# Patient Record
Sex: Female | Born: 1970
Health system: Southern US, Community
[De-identification: ages and names within clinical notes are randomized; demographics above are authoritative.]

## PROBLEM LIST (undated history)

## (undated) ENCOUNTER — Emergency Department (HOSPITAL_BASED_OUTPATIENT_CLINIC_OR_DEPARTMENT_OTHER): Admission: EM | Payer: BLUE CROSS/BLUE SHIELD | Source: Home / Self Care

## (undated) DIAGNOSIS — O1420 HELLP syndrome (HELLP), unspecified trimester: Secondary | ICD-10-CM

## (undated) DIAGNOSIS — A483 Toxic shock syndrome: Secondary | ICD-10-CM

## (undated) DIAGNOSIS — O139 Gestational [pregnancy-induced] hypertension without significant proteinuria, unspecified trimester: Secondary | ICD-10-CM

## (undated) DIAGNOSIS — I253 Aneurysm of heart: Secondary | ICD-10-CM

## (undated) DIAGNOSIS — F32A Depression, unspecified: Secondary | ICD-10-CM

## (undated) DIAGNOSIS — F53 Postpartum depression: Secondary | ICD-10-CM

## (undated) DIAGNOSIS — L0291 Cutaneous abscess, unspecified: Secondary | ICD-10-CM

## (undated) DIAGNOSIS — O159 Eclampsia, unspecified as to time period: Secondary | ICD-10-CM

## (undated) DIAGNOSIS — R011 Cardiac murmur, unspecified: Secondary | ICD-10-CM

## (undated) DIAGNOSIS — N719 Inflammatory disease of uterus, unspecified: Secondary | ICD-10-CM

## (undated) DIAGNOSIS — F329 Major depressive disorder, single episode, unspecified: Secondary | ICD-10-CM

## (undated) DIAGNOSIS — N63 Unspecified lump in unspecified breast: Secondary | ICD-10-CM

## (undated) DIAGNOSIS — B009 Herpesviral infection, unspecified: Secondary | ICD-10-CM

## (undated) DIAGNOSIS — M25369 Other instability, unspecified knee: Secondary | ICD-10-CM

## (undated) DIAGNOSIS — Z9289 Personal history of other medical treatment: Secondary | ICD-10-CM

## (undated) DIAGNOSIS — K589 Irritable bowel syndrome without diarrhea: Secondary | ICD-10-CM

## (undated) DIAGNOSIS — O99345 Other mental disorders complicating the puerperium: Secondary | ICD-10-CM

## (undated) HISTORY — DX: Postpartum depression: F53.0

## (undated) HISTORY — DX: Cardiac murmur, unspecified: R01.1

## (undated) HISTORY — PX: DILATION AND CURETTAGE OF UTERUS: SHX78

## (undated) HISTORY — DX: Gestational (pregnancy-induced) hypertension without significant proteinuria, unspecified trimester: O13.9

## (undated) HISTORY — DX: Aneurysm of heart: I25.3

## (undated) HISTORY — DX: Other mental disorders complicating the puerperium: O99.345

## (undated) HISTORY — DX: Personal history of other medical treatment: Z92.89

---

## 1994-11-02 HISTORY — PX: INCISE AND DRAIN ABCESS: PRO64

## 1995-11-03 DIAGNOSIS — A483 Toxic shock syndrome: Secondary | ICD-10-CM

## 1995-11-03 HISTORY — DX: Toxic shock syndrome: A48.3

## 1998-02-25 ENCOUNTER — Other Ambulatory Visit: Admission: RE | Admit: 1998-02-25 | Discharge: 1998-02-25 | Payer: Self-pay | Admitting: Obstetrics and Gynecology

## 1998-07-19 ENCOUNTER — Ambulatory Visit (HOSPITAL_COMMUNITY): Admission: RE | Admit: 1998-07-19 | Discharge: 1998-07-19 | Payer: Self-pay | Admitting: Obstetrics and Gynecology

## 1998-11-19 ENCOUNTER — Other Ambulatory Visit: Admission: RE | Admit: 1998-11-19 | Discharge: 1998-11-19 | Payer: Self-pay | Admitting: Obstetrics and Gynecology

## 1999-01-15 ENCOUNTER — Ambulatory Visit (HOSPITAL_COMMUNITY): Admission: RE | Admit: 1999-01-15 | Discharge: 1999-01-15 | Payer: Self-pay | Admitting: Obstetrics and Gynecology

## 1999-01-15 ENCOUNTER — Encounter: Payer: Self-pay | Admitting: Obstetrics and Gynecology

## 1999-06-08 ENCOUNTER — Inpatient Hospital Stay (HOSPITAL_COMMUNITY): Admission: AD | Admit: 1999-06-08 | Discharge: 1999-06-08 | Payer: Self-pay | Admitting: Obstetrics and Gynecology

## 1999-06-15 ENCOUNTER — Inpatient Hospital Stay (HOSPITAL_COMMUNITY): Admission: AD | Admit: 1999-06-15 | Discharge: 1999-06-17 | Payer: Self-pay | Admitting: *Deleted

## 1999-06-15 ENCOUNTER — Encounter (INDEPENDENT_AMBULATORY_CARE_PROVIDER_SITE_OTHER): Payer: Self-pay | Admitting: Specialist

## 1999-12-23 ENCOUNTER — Other Ambulatory Visit: Admission: RE | Admit: 1999-12-23 | Discharge: 1999-12-23 | Payer: Self-pay | Admitting: Obstetrics and Gynecology

## 2000-08-04 ENCOUNTER — Other Ambulatory Visit: Admission: RE | Admit: 2000-08-04 | Discharge: 2000-08-04 | Payer: Self-pay | Admitting: Obstetrics and Gynecology

## 2000-09-15 ENCOUNTER — Ambulatory Visit (HOSPITAL_COMMUNITY): Admission: RE | Admit: 2000-09-15 | Discharge: 2000-09-15 | Payer: Self-pay | Admitting: Obstetrics and Gynecology

## 2000-09-15 ENCOUNTER — Encounter: Payer: Self-pay | Admitting: Obstetrics and Gynecology

## 2001-02-25 ENCOUNTER — Inpatient Hospital Stay (HOSPITAL_COMMUNITY): Admission: AD | Admit: 2001-02-25 | Discharge: 2001-02-27 | Payer: Self-pay | Admitting: Obstetrics and Gynecology

## 2001-08-18 ENCOUNTER — Other Ambulatory Visit: Admission: RE | Admit: 2001-08-18 | Discharge: 2001-08-18 | Payer: Self-pay | Admitting: Obstetrics and Gynecology

## 2002-08-23 ENCOUNTER — Other Ambulatory Visit: Admission: RE | Admit: 2002-08-23 | Discharge: 2002-08-23 | Payer: Self-pay | Admitting: Obstetrics and Gynecology

## 2003-03-06 ENCOUNTER — Ambulatory Visit (HOSPITAL_COMMUNITY): Admission: RE | Admit: 2003-03-06 | Discharge: 2003-03-06 | Payer: Self-pay | Admitting: Obstetrics and Gynecology

## 2003-03-06 ENCOUNTER — Encounter: Payer: Self-pay | Admitting: Obstetrics and Gynecology

## 2003-04-30 ENCOUNTER — Encounter: Payer: Self-pay | Admitting: Obstetrics and Gynecology

## 2003-04-30 ENCOUNTER — Ambulatory Visit (HOSPITAL_COMMUNITY): Admission: RE | Admit: 2003-04-30 | Discharge: 2003-04-30 | Payer: Self-pay | Admitting: Obstetrics and Gynecology

## 2003-05-21 ENCOUNTER — Ambulatory Visit (HOSPITAL_COMMUNITY): Admission: RE | Admit: 2003-05-21 | Discharge: 2003-05-21 | Payer: Self-pay | Admitting: Obstetrics and Gynecology

## 2003-05-21 ENCOUNTER — Encounter: Payer: Self-pay | Admitting: Obstetrics and Gynecology

## 2003-07-06 ENCOUNTER — Ambulatory Visit (HOSPITAL_COMMUNITY): Admission: RE | Admit: 2003-07-06 | Discharge: 2003-07-06 | Payer: Self-pay | Admitting: Obstetrics and Gynecology

## 2003-07-06 ENCOUNTER — Encounter: Payer: Self-pay | Admitting: Obstetrics and Gynecology

## 2003-08-01 ENCOUNTER — Inpatient Hospital Stay (HOSPITAL_COMMUNITY): Admission: AD | Admit: 2003-08-01 | Discharge: 2003-08-03 | Payer: Self-pay | Admitting: Obstetrics and Gynecology

## 2003-08-05 ENCOUNTER — Inpatient Hospital Stay (HOSPITAL_COMMUNITY): Admission: AD | Admit: 2003-08-05 | Discharge: 2003-08-09 | Payer: Self-pay | Admitting: Obstetrics and Gynecology

## 2003-08-06 ENCOUNTER — Encounter: Payer: Self-pay | Admitting: Obstetrics and Gynecology

## 2003-08-07 ENCOUNTER — Encounter: Payer: Self-pay | Admitting: Obstetrics and Gynecology

## 2003-08-07 ENCOUNTER — Encounter (INDEPENDENT_AMBULATORY_CARE_PROVIDER_SITE_OTHER): Payer: Self-pay

## 2003-09-11 ENCOUNTER — Other Ambulatory Visit: Admission: RE | Admit: 2003-09-11 | Discharge: 2003-09-11 | Payer: Self-pay | Admitting: Obstetrics and Gynecology

## 2004-11-07 ENCOUNTER — Other Ambulatory Visit: Admission: RE | Admit: 2004-11-07 | Discharge: 2004-11-07 | Payer: Self-pay | Admitting: Obstetrics and Gynecology

## 2005-10-16 ENCOUNTER — Ambulatory Visit (HOSPITAL_COMMUNITY): Admission: RE | Admit: 2005-10-16 | Discharge: 2005-10-16 | Payer: Self-pay | Admitting: Obstetrics and Gynecology

## 2005-10-16 ENCOUNTER — Encounter (INDEPENDENT_AMBULATORY_CARE_PROVIDER_SITE_OTHER): Payer: Self-pay | Admitting: Specialist

## 2005-11-28 ENCOUNTER — Other Ambulatory Visit: Admission: RE | Admit: 2005-11-28 | Discharge: 2005-11-28 | Payer: Self-pay | Admitting: Obstetrics and Gynecology

## 2006-04-09 ENCOUNTER — Inpatient Hospital Stay (HOSPITAL_COMMUNITY): Admission: AD | Admit: 2006-04-09 | Discharge: 2006-04-09 | Payer: Self-pay | Admitting: Obstetrics and Gynecology

## 2006-11-10 ENCOUNTER — Inpatient Hospital Stay (HOSPITAL_COMMUNITY): Admission: AD | Admit: 2006-11-10 | Discharge: 2006-11-12 | Payer: Self-pay | Admitting: Obstetrics and Gynecology

## 2006-11-10 ENCOUNTER — Encounter (INDEPENDENT_AMBULATORY_CARE_PROVIDER_SITE_OTHER): Payer: Self-pay | Admitting: Specialist

## 2006-11-19 ENCOUNTER — Inpatient Hospital Stay (HOSPITAL_COMMUNITY): Admission: AD | Admit: 2006-11-19 | Discharge: 2006-11-19 | Payer: Self-pay | Admitting: Obstetrics and Gynecology

## 2008-12-08 ENCOUNTER — Emergency Department (HOSPITAL_BASED_OUTPATIENT_CLINIC_OR_DEPARTMENT_OTHER): Admission: EM | Admit: 2008-12-08 | Discharge: 2008-12-08 | Payer: Self-pay | Admitting: Emergency Medicine

## 2008-12-08 ENCOUNTER — Ambulatory Visit: Payer: Self-pay | Admitting: Diagnostic Radiology

## 2009-01-17 ENCOUNTER — Emergency Department (HOSPITAL_BASED_OUTPATIENT_CLINIC_OR_DEPARTMENT_OTHER): Admission: EM | Admit: 2009-01-17 | Discharge: 2009-01-17 | Payer: Self-pay | Admitting: Emergency Medicine

## 2011-02-12 LAB — URINALYSIS, ROUTINE W REFLEX MICROSCOPIC
Glucose, UA: NEGATIVE mg/dL
Ketones, ur: NEGATIVE mg/dL
Urobilinogen, UA: 0.2 mg/dL (ref 0.0–1.0)

## 2011-02-17 LAB — URINALYSIS, ROUTINE W REFLEX MICROSCOPIC
Bilirubin Urine: NEGATIVE
Glucose, UA: NEGATIVE mg/dL
Hgb urine dipstick: NEGATIVE
Ketones, ur: NEGATIVE mg/dL
Protein, ur: NEGATIVE mg/dL
pH: 6 (ref 5.0–8.0)

## 2011-02-17 LAB — CBC
MCHC: 31.8 g/dL (ref 30.0–36.0)
RBC: 5.42 MIL/uL — ABNORMAL HIGH (ref 3.87–5.11)
WBC: 8.6 10*3/uL (ref 4.0–10.5)

## 2011-02-17 LAB — DIFFERENTIAL
Basophils Relative: 4 % — ABNORMAL HIGH (ref 0–1)
Eosinophils Absolute: 0 10*3/uL (ref 0.0–0.7)
Eosinophils Relative: 1 % (ref 0–5)
Lymphocytes Relative: 19 % (ref 12–46)
Lymphs Abs: 1.7 10*3/uL (ref 0.7–4.0)
Monocytes Absolute: 0.9 10*3/uL (ref 0.1–1.0)
Monocytes Relative: 11 % (ref 3–12)
Neutro Abs: 5.7 10*3/uL (ref 1.7–7.7)
Neutrophils Relative %: 66 % (ref 43–77)

## 2011-02-17 LAB — URINE CULTURE

## 2011-02-17 LAB — COMPREHENSIVE METABOLIC PANEL
ALT: 26 U/L (ref 0–35)
Albumin: 3.6 g/dL (ref 3.5–5.2)
Alkaline Phosphatase: 66 U/L (ref 39–117)
BUN: 11 mg/dL (ref 6–23)
Creatinine, Ser: 0.7 mg/dL (ref 0.4–1.2)
Sodium: 139 mEq/L (ref 135–145)

## 2011-03-20 NOTE — Discharge Summary (Signed)
NAME:  Tiffany Sharp, Tiffany Sharp                         ACCOUNT NO.:  [id number]   MEDICAL RECORD NO.:  [id number]                   PATIENT TYPE:  INP   LOCATION:  9326                                 [facility]ILITY:  [facility]   PHYSICIAN:  Naima A. Dillard, M.D.              DATE OF BIRTH:  October 23, 1971   DATE OF ADMISSION:  08/05/2003  DATE OF DISCHARGE:  08/09/2003                                 DISCHARGE SUMMARY   ADMITTING DIAGNOSES:  1. Postpartum day #5.  2. Questionable seizure activity, eclampsia.  3. Hypertension with elevated liver enzymes.   DISCHARGE DIAGNOSES:  1. Postpartum day #9.  2. Eclampsia with hemolysis, elevated liver enzymes, and low platelet count     syndrome.  3. Endometritis.  4. Retained products of conception.   HOSPITAL PROCEDURES:  1. IV antibiotics.  2. Magnesium sulfate administration.  3. AICU monitoring.  4. Head CT.  5. D&E of retained products of conception.   HOSPITAL COURSE:  The patient was admitted with reports of severe shaking  episode at home which occurred on several occasions.  She did not suffer any  loss of consciousness, she did complain of flu-like symptoms with chills and  sweats and body aches, she also complained of a severe frontal headache and  abdominal discomfort related to uterine cramping.  She was admitted for  presumed eclampsia and HELLP syndrome.  Head CT was performed to rule out  intracranial hemorrhage; this was found to be negative for any pathology.  Ultrasound was done of her left axilla also on that day to rule out an  abscess from a mass that was noted and this resulted in no observed  pathology also.  She began to receive IV antibiotics for development of  fevers.  A 24-hour urine was collected which revealed proteinuria.  She  received magnesium sulfate administration for treatment of presumed  eclampsia.   On August 07, 2003 she had an ultrasound which showed an echogenic area  consistent with retained products of  conception and was taken to the  operating room for a D&E with ultrasound guidance under MAC anesthesia; this  was performed without any complications and she is taken back to the AICU  for observation.   The next day she was doing well, baby was breast-feeding and visiting her in  the room, she did not have any headache or abdominal cramping, her white  count resumed to normal levels, magnesium sulfate was discontinued and  antibiotics were continued, she was transferred out to the general floor for  continued care.   On August 09, 2003 temperatures were ranging from 100.6 to 99.2, blood  pressures ranged from 122-138/78-94, lochia was small, breasts were soft,  fundus was nontender, blood cultures were negative, urine culture was  negative.  It was recommended to her that she remain in the hospital for 24  hours to effect a 24-hour period of being afebrile.  She stated however,  that she wanted to go home despite these recommendations, a compromise was  established by Dr. Normand Sloop which includes monitoring of temperature until  the evening hours, if the temperature remains less than 101 she would be  discharged home with IV antibiotics via [facility].  She agreed to  this plan and this was undertaken.   DISCHARGE MEDICATIONS:  IV antibiotics per [facility] and Motrin  p.r.n.   DISCHARGE LABORATORIES:  Urine culture negative.  White blood cell count  8.4, hemoglobin 7.5, platelets 220.  Chemistries:  Sodium 136, potassium  3.8, creatinine 0.7, AST 26, ALT 36, ALP 96, bilirubin 0.2, LDH 177.   DISCHARGE INSTRUCTIONS TO INCLUDE:  Monitoring of temperature and routine  postpartum care.   DISCHARGE FOLLOWUP:  One week at [facility] or p.r.n.     Marie L. Williams, C.N.M.                 Naima A. Normand Sloop, M.D.    MLW/MEDQ  D:  08/09/2003  T:  08/09/2003  Job:  [id number]

## 2011-03-20 NOTE — H&P (Signed)
Tiffany Sharp, Tiffany Sharp               ACCOUNT NO.:  [id number]   MEDICAL RECORD NO.:  [id number]          PATIENT TYPE:  AMB   LOCATION:  SDC                           [facility]  [facility]   PHYSICIAN:  Naima A. Dillard, M.D. DATE OF BIRTH:  May 01, 1971   DATE OF ADMISSION:  DATE OF DISCHARGE:                                HISTORY & PHYSICAL   CHIEF COMPLAINT:  Missed abortion in the first trimester.   HISTORY OF PRESENT ILLNESS:  The patient is a 40 year old gravida 5, para 3-  0-2-3, whose last menstrual period was July 20, 2005, and presented on  September 29, 2005, with some bleeding.  She had an ultrasound which showed  absent cardiac activity on September 30, 2005.  The patient was trying to  just have observation, but had bleeding and had repeat ultrasound which  showed retained sac and still missed abortion.  The patient has opted to  proceed with a D&E.   PAST GYN HISTORY:  History of abnormal Pap with LEEP.  History of HSV2.   PAST OBSTETRICAL HISTORY:  In the year 2000, the patient had a normal  spontaneous vaginal delivery with the birth of a 7 pound 0 ounce female  infant.  In 2002, she had a normal spontaneous vaginal delivery of an 8  pound 8 ounce female infant without any complications.  The patient had  another normal delivery in 2004 without any problems.  She had one  miscarriage in 2005.   PAST MEDICAL HISTORY:  Significant for HSV, iron deficiency anemia, heart  murmur that requires no treatment, and history of toxic shock syndrome with  lung and kidney failure.  The patient recovered completely.   FAMILY HISTORY:  Significant for the patient's mother and maternal  grandmother with a history of varicose veins, paternal grandfather with  hypertension and CVA.  The patient's paternal grandmother and paternal  grandfather with hypertension.   ALLERGIES:  SULFA, PENICILLIN.   SOCIAL HISTORY:  The patient denies any use of tobacco, alcohol, or illicit  drug  use.   REVIEW OF SYSTEMS:  GENITOURINARY:  As above.  GASTROINTESTINAL:  CARDIOVASCULAR:  MUSCULOSKELETAL:  All unremarkable.   PHYSICAL EXAMINATION:  VITAL SIGNS:  The patient weighs 233 pounds, blood  pressure is 130/84.  HEENT:  Pupils are equal, hearing is normal, throat is clear.  Thyroid is  not enlarged.  HEART:  Regular rate and rhythm.  CHEST:  Clear to auscultation bilaterally.  BREASTS:  Deferred.  BACK:  No CVA tenderness bilaterally.  ABDOMEN:  Soft and nontender.  EXTREMITIES:  No cyanosis, clubbing, or edema.  PELVIC:  Vulva and vaginal examination is within normal limits.  Cervix is  nontender without any lesions.  Os is closed.  Uterus is normal shape, size,  and consistency.  Adnexa has no masses.   On ultrasound today, there is a gestational sac and fetal pole still present  measuring 7 weeks 5 days.  GC and Chlamydia were negative.  The patient's  blood type is A positive.   ASSESSMENT:  Missed abortion in first trimester.   PLAN:  D&E.  The patient was given the options of observation and Cytotec.  She decided for D&E.  She understands the risks are, but not limited to  bleeding, infection, damage to internal organs such as bowel, bladder, major  blood vessels, perforation of the uterus, and Asherman's syndrome which  could lead to infertility.      Naima A. Normand Sloop, M.D.  Electronically Signed     NAD/MEDQ  D:  10/15/2005  T:  10/15/2005  Job:  [id number]

## 2011-03-20 NOTE — H&P (Signed)
[facility]  Patient:    Tiffany Sharp, Tiffany Sharp                        MRN: [id number] Adm. Date:  02/25/01 Attending:  Janine Limbo, M.D. Dictator:   Vance Gather Duplantis, C.N.M.                         History and Physical  HISTORY OF PRESENT ILLNESS:   Tiffany Sharp is a 40 year old, married, black female, gravida 2, para 1-0-0-1, at 41 weeks, who presents complaining of uterine contractions every 4-5 minutes throughout the afternoon.  She denies any leaking, vaginal bleeding, nausea, vomiting.  She reports positive fetal movement.  PRENATAL COURSE:              Her pregnancy has been followed at [facility] by the Certified Nurse-Midwife Service and has been essentially uncomplicated, though at risk for:                               1. Positive group B strep.                               2. History of HSV II with no current lesions.                               3. History of LEEP procedure.                               4. Second pregnancy within less than 12 months.  OBSTETRIC/GYNCOLOGIC HISTORY:                      She is a gravida 2, para 1-0-0-1, who delivered a viable female infant named Tiffany Sharp, who weighed 7 pounds 0 ounces at [redacted] weeks gestation in August of 2000.  She had an episode with that labor.  Her other GYN history, she had an abnormal Pap in 1995 and was treated with colposcopy and LEEP.  She has a history of HSV II but no current lesions and positive group B strep with her previous pregnancy and history of postpartum depression following her last delivery.  ALLERGIES:                    She is allergic to SULFA and PENICILLIN.  PAST MEDICAL HISTORY:         She reports having had the usual childhood diseases.  She reports a history of a heart murmur with no required treatment. No other medical problems.  Her only surgery was an abscess in her right axilla that was drained with no complications, and her only  hospitalization was for toxic shock syndrome in the past for which she was admitted.  FAMILY HISTORY:               Significant for paternal grandfather with hypertension and stroke.  Mother and maternal grandmother with varicosities. Maternal grandmother with colon cancer.  Paternal grandfather with stroke, Alzheimers.  GENETIC HISTORY:              Significant for maternal uncle with mental retardation.  SOCIAL HISTORY:  She is married to [employer] who is involved and supportive.  They are of the [place] faith.  They deny any illicit drug use, alcohol or smoking with this pregnancy.  PRENATAL LABORATORY DATA:     Her blood type is A positive.  Her antibody screen is negative.  Syphilis is nonreactive.  Rubella is immune.  Hepatitis B surface antigen is negative.  Pap smear is within normal limits.  Sickle cell trait is negative.  One-hour Glucola is within normal range and she declined maternal serum alpha-fetoprotein.  PHYSICAL EXAMINATION:  VITAL SIGNS:                  Her vital signs are stable.  She is afebrile.  HEENT:                        Grossly within normal limits.  HEART:                        Regular rhythm and rate.  CHEST:                        Clear.  BREASTS:                      Soft and nontender.  ABDOMEN:                      Gravid with uterine contractions every 4-6 minutes.  Her fetal heart rate is reactive and reassuring.  PELVIC:                       Her cervix is 4-5 cm, 90%, vertex, -1 with intact membranes.  Her speculum examination reveals no lesions.  EXTREMITIES:                  Within normal limits.  ASSESSMENT:                   1. Intrauterine pregnancy, at 41 weeks.                               2. Early active labor.                               3. Positive group B streptococcus.                               4. History of herpes simplex virus with no                                  lesions.  PLAN:                          Admit to labor and delivery to follow routine CNM orders and to give her clindamycin for group B strep and epidural as she desires, and Dr. Leonard Schwartz has been notified of patients admission. DD:  02/25/01 TD:  02/25/01 Job: [id number]

## 2011-03-20 NOTE — Op Note (Signed)
NAME:  Tiffany Sharp, Tiffany Sharp                         ACCOUNT NO.:  [id number]   MEDICAL RECORD NO.:  [id number]                   PATIENT TYPE:  INP   LOCATION:  9374                                 [facility]ACILITY:  [facility]   PHYSICIAN:  Naima A. Dillard, M.D.              DATE OF BIRTH:  Jul 23, 1971   DATE OF PROCEDURE:  08/07/2003  DATE OF DISCHARGE:                                 OPERATIVE REPORT   PREOPERATIVE DIAGNOSES:  1. Retained products of conception.  2. Endometritis.  3. Questionable eclampsia.   POSTOPERATIVE DIAGNOSES:  1. Retained products of conception.  2. Endometritis.  3. Questionable eclampsia.   PROCEDURE:  Dilatation and evacuation.   SURGEON:  Naima A. Normand Sloop, M.D.   ANESTHESIA:  MAC and cervical block with 10 mL 1% lidocaine.   ESTIMATED BLOOD LOSS:  Minimal.   IV FLUIDS:  700 mL.   COMPLICATIONS:  None.   FINDINGS:  A 16 weeks' size uterus.  The cervix was dilated 1 cm.  Minimal  products of conception were obtained.  The patient went to the recovery room  in stable condition.   PROCEDURE IN DETAIL:  Before the procedure was done consent was obtained.  The patient was told the risks were, but not limited to, bleeding,  infection, damage to the uterus by perforation of the uterus which could  lead to hysterectomy and Asherman's syndrome.  The patient consented to the  procedure.  The patient was taken to the operating room where she was given  MAC anesthesia, placed in dorsal lithotomy position, and prepped and draped  in a normal sterile fashion.  Her bladder was drained of about 50 mL of  urine.  A speculum was placed into the vagina and a single-tooth tenaculum  was placed on the cervix.  Her uterus was found to be about 16 weeks size  with 1 cm open uterus.  The cervix did not have to be dilated at all.  A  size 14 suction curettage was then placed into the uterus and done until a  gritty surface was noted.  __________ curette was then also done until  a  gritty surface was noted.  Ultrasound then evaluated the uterus and saw that  there was still a small amount of retained products.  The rest of the  curettage was done under direct visualization with ultrasound until retained  products were seen to be gone.  Before the curette was done, the anterior  lip of the cervix was grasped with a single-tooth tenaculum and 10 mL of 1%  lidocaine was used for a cervical block.  Once there were no further  products returning and ultrasound  was significant for no further products remaining.  All instruments were  removed from the uterus and tenaculum was removed from the cervix without  difficulty.  site being hemostatic.  Sponge, lap, and needle counts were  correct x2.  The  patient went to the recovery room in stable condition.                                               Naima A. Normand Sloop, M.D.    NAD/MEDQ  D:  08/07/2003  T:  08/07/2003  Job:  [id number]

## 2011-03-20 NOTE — H&P (Signed)
NAMERIONA, LAHTI               ACCOUNT NO.:  [id number]   MEDICAL RECORD NO.:  [id number]          PATIENT TYPE:  INP   LOCATION:  9165                          [facility]ILITY:  [facility]   PHYSICIAN:  Osborn Coho, M.D.   DATE OF BIRTH:  07-14-1971   DATE OF ADMISSION:  11/10/2006  DATE OF DISCHARGE:                              HISTORY & PHYSICAL   Ms. Creps is a 40 year old gravida 6, para 3, 0-2-3 at 39-4/7 weeks who  was admitted today with spontaneous rupture of membranes at  approximately 2 p.m.  She reports clear fluids and uterine contractions  approximately every five minutes of mild quality.  Her pregnancy has  been remarkable for:  1)  Advanced maternal age with amnio decline.  First trimester screening was done and was normal.  Normal nuchal  translucency and second trimester AFP was not noted in the chart.  2)  Patient is considering tubal ligation but has not completely decided.  3)  First trimester bleeding.  4)  History of preterm labor with term  delivery.  5)  History of macrosomia with normal 18 and 27 week Glucola.  6)  History of preeclampsia and eclampsia with previous pregnancy.  7).  History of HSV-II with no recent recurrent lesions.  8)  History of LEEP  procedure in 1995.  9)  History of postpartum hemorrhage and retained  products of conception.   PRENATAL LABS:  Blood type is A+.  Rh antibody negative.  VDRL  nonreactive.  Rubella titer is equivocal.  Hepatitis B surface antigen  negative.  HIV is declined.  Sickle cell test was negative.  Cystic  fibrosis testing was declined.  GC and Chlamydia cultures were negative  in June.  Pap was normal in January.  First trimester screening was  normal.  Glucola was normal at 19 weeks.  Repeat Glucola was normal at  27 weeks.  Hemoglobin upon entering the practice was 12.5.  It was 11.5  at 27 weeks.  Fetal fibronectin was done at 29 weeks and was negative.  Group B strep culture was done at 35 weeks and was also  negative.  EDC  of November 13, 2006 was established by ultrasound in the first trimester  secondary to conception on OCPs.  Group B strep culture was negative at  36 weeks.   HISTORY OF PRESENT PREGNANCY:  Patient entered care at approximately 10  weeks.  She had first trimester screening done.  Had a first trimester  bleeding episode in July, but this resolved.  First trimester screening  was normal.  She had a probable HSV outbreak at 18 weeks that was  treated with Valtrex.  At 19 weeks, she had an ultrasound that showed  normal growth and development.  Priority adjusted risk of Down's  syndrome was 1 in 4821 with first trimester screening.  Early Glucola  was normal.  The patient was noted to have multiple vulvar varicosities  at 23 weeks.  Repeat Glucola was done at 27 weeks.  It was normal.  RPR  was also nonreactive at that time.  She is having  more frequent  contractions at 29 weeks.  She had a negative fetal fibronectin at that  time.  She started Valtrex prophylaxis at 35 weeks.  At 36 weeks, she  had a history of eclampsia with a previous pregnancy, so 24-hour urine  and pH labs were done that were negative.  Platelets were mildly  decreased at 147.  She had an ultrasound at 38 weeks showing growth at  the 87-89th percentile.  Estimated weight of 6 pounds, 12 ounces, and  fluid at the 70th percentile.  The rest of her pregnancy was essentially  uncomplicated.   OBSTETRICAL HISTORY:  In 2000, she had a vaginal birth of a female  infant, weight 7 pounds, 10 ounces, at 40-2/7 weeks.  She was in labor  15 hours.  She had epidural anesthesia.  She did have a cord avulsion  with manual placental removal at that time.  In 2002, she had a vaginal  birth of a female infant that weighed 8 pounds, 8 ounces at 41-1/7  weeks.  She was in labor eight hours.  She had epidural anesthesia.  She  did have meconium with that pregnancy.  In 2004, she had a vaginal birth  of a female infant,  weight 9 pounds, 10 ounces at 40-6/7 weeks.  She was  in labor 8-1/2 hours.  She had epidural anesthesia.  She did have a  postpartum hemorrhage with that pregnancy with retained products of  conception and a D&E.  She also had an eclampsia seizure within the  first four weeks after delivery.  She was hospitalized on magnesium.  She had a head CT.  She had endometriosis with a D&E done and was sent  home on antibiotics.  She had postpartum depression following her first  birth but was not on any medication.   In 2006, she had a spontaneous miscarriage at five weeks that did not  require D&C.  In December, 2006, she had a first trimester miscarriage  that did require D&C.   PAST MEDICAL HISTORY:  She conceived on oral contraceptives.  She had a  LEEP procedure in 1995.  She has a history of HSV-II but no recent or  recurrent lesions.  She did have group B strep with her first pregnancy.  She has a history of a heart murmur but no SBE prophylaxis was ever  needed.  She had an abscess in her right axilla.  She developed toxic  shock syndrome.  Had respiratory renal failure and this did resolve  spontaneously without residual problems.  Her only other  hospitalizations were for childbirth x3 and D&C x2.   FAMILY HISTORY:  Her paternal grandfather and paternal grandmother and  father have hypertension.  Her mother and maternal grandmother had  varicosities.  Her paternal grandfather had a stroke and Alzheimer's.  Her maternal grandmother had colon cancer.   GENETIC HISTORY:  Remarkable for the patient being 35 at the time of  delivery, and she has a maternal uncle who is mentally retarded.   Patient is allergic to SULFA and PENICILLIN, which cause hives.   SOCIAL HISTORY:  Patient is married to the father of the baby.  He is  involved and supportive.  His name is Haeven Nickle.  Patient is [place] female, of the [place] faith.  She has some years of college  and is  self-employed.  Her husband has a Buyer, retail degree.  He is a  Optician, dispensing.  She has been followed by the certified nurse  midwife service  at [facility].  She denies any alcohol, drugs, or tobacco use  during this pregnancy.   PHYSICAL EXAMINATION:  VITAL SIGNS:  Stable.  Patient is afebrile.  HEENT:  Within normal limits.  LUNGS:  Her breath sounds are clear.  HEART:  Regular rate and rhythm without murmur.  BREASTS:  Soft and nontender.  ABDOMEN:  Fundal height is approximately 38 cm.  Estimated fetal weight  is 8 to 8-1/2 pounds.  Uterine contractions are every five minutes, mild  quality.  Patient is noted to be leaking a moderate amount of clear  fluid, positive fern, positive Nitrazine, and positive pooling are  noted.  Cervix is posterior, 2 cm, 70% vertex, and a -1, -2 station.  Fetal heart rate is in the 150 range by Doppler.  EXTREMITIES:  Deep tendon reflexes are 2+ without clonus.  There is  trace edema noted.  Weight today is 249 pounds.  PELVIC:  Also unremarkable for no HSV lesions or prodrome.   ASSESSMENT:  1. Intrauterine pregnancy at 39-4/7 weeks.  2. Spontaneous rupture of membranes with very early labor.  3. Negative group B strep.  4. History of herpes simplex virus II with no recent or current      lesions.  5. History of LEEP procedure in 1995  6. History of Macrosomia.  7. History of preeclampsia and eclampsia with normal blood pressures      this pregnancy.   PLAN:  1. Admit to birthing suite for consult with Dr. Osborn Coho as      attending physician.  2. Routine certified nurse midwife orders.  3. Patient will plan an epidural as labor progresses.  4. Will monitor labor progress and will take the appropriate      precautions for monitoring for shoulder dystocia.  5. Augmentation p.r.n.      Renaldo Reel Emilee Hero, C.N.M.      Osborn Coho, M.D.  Electronically Signed    VLL/MEDQ  D:  11/10/2006  T:  11/10/2006  Job:  [id number]

## 2011-03-20 NOTE — H&P (Signed)
NAME:  Tiffany Sharp, Tiffany Sharp                         ACCOUNT NO.:  [id number]   MEDICAL RECORD NO.:  [id number]                   PATIENT TYPE:  INP   LOCATION:  9374                                 FACILITY:  [facility]   PHYSICIAN:  Naima A. Dillard, M.D.              DATE OF BIRTH:  1971/08/06   DATE OF ADMISSION:  08/05/2003  DATE OF DISCHARGE:                                HISTORY & PHYSICAL   HISTORY OF PRESENT ILLNESS:  Tiffany Sharp is a 40 year old para 3-0-0-3  status post vaginal delivery on August 02, 2003 who presents following  what she describes as a severe shaking episode.  Her husband called at 2027  this p.m. stating that the patient was having convulsions last p.m. and  starting again this evening.  The patient expresses having a severe shaking  episode last evening at approximately 2300 which lasted several minutes  during which she could not move or call out for help but with no loss of  consciousness and no residual effects following the episode.  She has had  several more episodes today and this evening just prior to husband calling  but these episodes were not severe nor lasting as long as yesterday; again,  with no loss of consciousness.  The patient does complain of flu-like  symptoms, alternating chills and sweats with body aches.  She complains of  severe frontal headache since delivery; no blurred vision, no epigastric  pain.  She complains of abdominal discomfort from uterine massage in the  hospital following postpartum hemorrhage but no increased uterine cramping  and normal lochia; no excessive bleeding or clots.  She complains of nausea  this afternoon following which she had one loose BM stool and no further  nausea; no vomiting.  She is able to take p.o. foods and fluids without  difficulty.  She is breastfeeding her infant.  The baby is breastfeeding  well.  She is not having any pain during breastfeeding.  No lumps, hard  areas, or redness noted in breast;  no cracked or sore nipples.  The patient  is currently in no acute distress, alert and oriented x3, moving all  extremities well.   PAST MEDICAL HISTORY:  Does not include any seizure disorders.  She did have  an abscess of the right axilla following which she had complications and  went into toxic shock syndrome with kidney failure and recovered completely  from that.   OBSTETRICAL HISTORY:  Status post vaginal delivery on August 02, 2003 of  a female infant named Porfirio Mylar.  The baby was greater than 9 pounds.  The  patient experienced a postpartum hemorrhage.  CBC predelivery:  Wbc's 10.6;  hemoglobin 12.1; hematocrit 36.2; and platelets 145,000.  On October 1 post  delivery:  Wbc's 12.0; hemoglobin 7.9; hematocrit 23.1; and platelets  106,000.  The patient had a normal spontaneous vaginal delivery in 2000 and  2001 with no  complications.   GYNECOLOGICAL HISTORY:  Includes an abnormal Pap smear and LEEP in 1995 and  a history of HSV 2.   FAMILY HISTORY:  Paternal grandfather - hypertension, stroke, and  Alzheimer's disease.  Maternal grandmother - colon cancer.   PHYSICAL EXAMINATION:  VITAL SIGNS:  Temperature 98.9, pulse 112 and 101,  respirations 20 and unlabored, blood pressure 149/92 and 112/65.  GENERAL:  The patient is alert and oriented x3 in no acute distress.  HEART:  Regular rate and rhythm.  LUNGS:  Clear bilaterally to auscultation.  ABDOMEN:  Soft and mildly tender.  Fundus is firm, 1 to 2 below U, with  moderate lochia.  No edema noted.  EXTREMITIES:  DTRs 1+ with no clonus.    ASSESSMENT:  Postpartum day #5; questionable seizure activity, questionable  febrile morbidity, with one elevated blood pressure and elevated liver  enzymes; rule out eclampsia.   PLAN:  Admit per Dr. Jaymes Graff.  See orders as written.     Rica Koyanagi, C.N.M.               Naima A. Normand Sloop, M.D.    SDM/MEDQ  D:  08/06/2003  T:  08/06/2003  Job:  [id number]

## 2011-03-20 NOTE — H&P (Signed)
NAMEANHELICA, FOWERS                         ACCOUNT NO.:  [id number]   MEDICAL RECORD NO.:  [id number]                   PATIENT TYPE:  INP   LOCATION:  9161                                 FACILITY:  [facility]   PHYSICIAN:  Naima A. Dillard, M.D.              DATE OF BIRTH:  1971/04/25   DATE OF ADMISSION:  08/01/2003  DATE OF DISCHARGE:                                HISTORY & PHYSICAL   HISTORY OF PRESENT ILLNESS:  Ms. Steller is a 40 year old, gravida 3, para 2-  0-0-2, at 72 and 6/7 weeks, EDD July 26, 2003, who presents in early  active labor with positive fetal movement.  She does report a bloody show,  but no active bleeding, no rupture of membranes.  She has a history of HSV-2  with no lesions and no prodrome at the present time.  No PIH symptoms  reported.  Her pregnancy has been followed by the CNM services, [facility], and is  remarkable for - (1) history of abnormal Pap and LEEP, (2) history of HSV-2,  and (3) group B strep negative.   OBSTETRICAL HISTORY:  In the year 2000, the patient had a normal spontaneous  vaginal delivery with the birth of a 7 pound, 0 ounce female infant at term  with no complications.  In 2002, she had a normal spontaneous vaginal  delivery with the birth of an 8 pound, 8 ounce female infant at term with no  complications, and the present pregnancy.  This patient was initially  evaluated at the office of [facility] on January 02, 2003 at 10 weeks, 5 days  gestation.  EDC determined by dates and confirmed with ultrasound.  Her  pregnancy has been essentially uncomplicated.  She has measured slightly  large for dates.  She has been normotensive with no proteinuria.  On  ultrasound at [redacted] weeks gestation performed to complete anatomy scan, the  patient's estimated fetal weight for fetal size was at the 90th to 95th  percentile.  She therefore was followed with serial ultrasounds for  estimated fetal weight, and at 32 weeks, the patient's estimated fetal  weight  again was greater than the 95th percentile.  At 38 weeks, the  estimated fetal weight was 3551 gm, at the 90th to 95th percentile.  At 40  weeks, estimated fetal weight was between the 75th to 90th percentile.  She  was seen by Dr. Stefano Gaul on that day.  Her cervix was noted to be 3 cm  dilated, and she was scheduled for induction of labor on Thursday, August 02, 2003 at 41 weeks.  On all ultrasound evaluations, fluid has been normal.  The patient began Valtrex 500 mg p.o. daily at 36 weeks for prophylaxis  against herpes outbreak, and she has had no herpes outbreaks with this  pregnancy.   MEDICAL HISTORY:  Significant for herpes with rare outbreaks.  Positive  group B strep with  first pregnancy.  Iron deficiency anemia.  She has a  history of a heart murmur with no treatment.  She had an abscess of the  right axilla complicated with toxic shock syndrome, lung and kidney failure,  and recovered completely.   FAMILY HISTORY:  The patient's mother and maternal grandmother with a  history of varicose veins.  Paternal grandfather with hypertension, CVA.  The patient's paternal grandmother and father with hypertension.  The  patient's mother with colon cancer.  Paternal grandfather with stroke.  Paternal grandfather with Alzheimer's disease.  A maternal uncle is noted to  have mental retardation.  Otherwise, there is no family history of genetic  or chromosomal disorders, children that died in infancy, or that were born  with birth defects.   ALLERGIES:  1. SULFA.  2. PENICILLIN.   SOCIAL HISTORY:  She denies the use of tobacco, alcohol, or illicit drugs.   HOSPITAL COURSE:  Ms. Judithann Sheen is a 40 year old African-American married  female.  Her husband, Cassandra Harbold, is involved and supportive.  They are  [place] in their faith.   REVIEW OF SYSTEMS:  As described above.  The patient is typical of one with  a uterine pregnancy at term in early active labor.   PHYSICAL EXAMINATION:   VITAL SIGNS:  Stable.  Afebrile.  HEENT:  Unremarkable.  HEART:  Regular rate and rhythm.  LUNGS:  Clear.  ABDOMEN:  Gravid in its contour.  Uterine fundus is noted to extend 41 cm  above the level of the pubic symphysis.  Leopold's maneuver finds the infant  to be in a longitudinal lie, cephalic presentation, and the estimated fetal  weight is 8.5 to 9 pounds.  The baseline of the fetal heart rate monitor is  140's with average long-term variability.  Positive accelerations.  Reactive  is present with no periodic changes.  The patient is contracting every 3-6  minutes.  PELVIC:  Digital exam of the cervix finds it to be 4-5 cm dilated, 90%  effaced, with the cephalic presenting part at a -2 station, and a bulging  bag of water.  External and internal examination of the cervix and external  genitalia shows no HSV lesions.  EXTREMITIES:  No pathologic edema.  DTRs are 1+ with no clonus.   ASSESSMENT:  Intrauterine pregnancy at 40 and 6/7 weeks.  Early active  labor.   PLAN:  Admit per Dr. Jaymes Graff.  Routine CNM orders.  May have an  epidural.  The patient will be watched closely for evaluation of normal  labor curve, in light of large baby.  Spontaneous vaginal delivery is  anticipated.     Rica Koyanagi, C.N.M.               Naima A. Normand Sloop, M.D.    SDM/MEDQ  D:  08/01/2003  T:  08/01/2003  Job:  [id number]

## 2011-03-20 NOTE — Op Note (Signed)
NAMEPALIN, TRISTAN               ACCOUNT NO.:  [id number]   MEDICAL RECORD NO.:  [id number]          PATIENT TYPE:  AMB   LOCATION:  SDC                           [facility]ACILITY:  [facility]   PHYSICIAN:  Naima A. Dillard, M.D. DATE OF BIRTH:  [birth year]   DATE OF PROCEDURE:  10/16/2005  DATE OF DISCHARGE:                                 OPERATIVE REPORT   PREOPERATIVE DIAGNOSIS:  Missed abortion in the first trimester.   POSTOPERATIVE DIAGNOSIS:  Missed abortion in the first trimester.   OPERATION/PROCEDURE:  Dilation and evacuation.   SURGEON:  Naima A. Dillard, M.D.   ASSISTANT:  None.   ANESTHESIA:  MAC and local anesthesia.   SPECIMENS:  Products of conception.   ESTIMATED BLOOD LOSS:  Minimal.   COMPLICATIONS:  None.   DISCHARGE PLAN:  The patient went to the PACU in stable condition.   DESCRIPTION OF PROCEDURE:  The patient was taken to the operating room where  she was given MAC anesthesia, placed in the dorsal lithotomy position and  prepped and draped in the normal sterile fashion.  The anterior lip of the  cervix grasped with a single-tooth tenaculum. Cervix was dilated with Pratt  dilators up to 21.  Suction curettage was placed into the uterine cavity and  curetted until a gritty texture was noted.  Products of conception were  seen.  Sponge curette was placed into the uterine cavity and some products  were seen.  Suction curettage was then replaced in the uterine cavity and no  more products were seen, just blood.  All instruments were removed from  vagina. There was some bleeding from the left side of the tenaculum site  which was made hemostatic with silver nitrate.  All instruments were removed  from the vagina.  Sponge, lap and needle counts were correct x2.  The  patient went to the recovery room in stable condition.      Naima A. Normand Sloop, M.D.  Electronically Signed     NAD/MEDQ  D:  10/16/2005  T:  10/19/2005  Job:  [id number]

## 2011-08-08 ENCOUNTER — Emergency Department (INDEPENDENT_AMBULATORY_CARE_PROVIDER_SITE_OTHER): Payer: 59

## 2011-08-08 ENCOUNTER — Emergency Department (HOSPITAL_BASED_OUTPATIENT_CLINIC_OR_DEPARTMENT_OTHER)
Admission: EM | Admit: 2011-08-08 | Discharge: 2011-08-08 | Disposition: A | Discharge status: Home / Self Care | Payer: 59 | Attending: Emergency Medicine | Admitting: Emergency Medicine

## 2011-08-08 ENCOUNTER — Encounter: Payer: Self-pay | Admitting: *Deleted

## 2011-08-08 DIAGNOSIS — R51 Headache: Secondary | ICD-10-CM

## 2011-08-08 DIAGNOSIS — R42 Dizziness and giddiness: Secondary | ICD-10-CM

## 2011-08-08 HISTORY — DX: Toxic shock syndrome: A48.3

## 2011-08-08 HISTORY — DX: Cutaneous abscess, unspecified: L02.91

## 2011-08-08 LAB — DIFFERENTIAL
Neutro Abs: 4.3 10*3/uL (ref 1.7–7.7)
Neutrophils Relative %: 55 % (ref 43–77)

## 2011-08-08 LAB — BASIC METABOLIC PANEL
BUN: 8 mg/dL (ref 6–23)
CO2: 28 mEq/L (ref 19–32)
Chloride: 105 mEq/L (ref 96–112)
Creatinine, Ser: 0.6 mg/dL (ref 0.50–1.10)
GFR calc non Af Amer: >90 mL/min (ref >90)
Glucose, Bld: 62 mg/dL — ABNORMAL LOW (ref 70–99)
Potassium: 3.8 mEq/L (ref 3.5–5.1)

## 2011-08-08 LAB — CBC
MCH: 23 pg — ABNORMAL LOW (ref 26.0–34.0)
Platelets: 165 10*3/uL (ref 150–400)

## 2011-08-08 MED ORDER — KETOROLAC TROMETHAMINE 60 MG/2ML IM SOLN
60.0000 mg | Freq: Once | INTRAMUSCULAR | Status: AC
  Administered 2011-08-08: 60 mg via INTRAMUSCULAR
  Filled 2011-08-08: qty 2

## 2011-08-08 MED ORDER — IBUPROFEN 800 MG PO TABS: 800.0000 mg | ORAL_TABLET | Freq: Three times a day (TID) | ORAL | Status: AC

## 2011-08-08 NOTE — ED Notes (Signed)
Pt states she has had dizziness, pressure in the back of her head and around eyes for 3 weeks. Seen at Urgent Care. Dx'd with sinusitis and given Zithromax which she has finished. Saw ENT yesterday. Nodule found and referred to neurologist and thyroid ultrasound scheduled. Still concerned that something "isn't right".

## 2011-08-08 NOTE — ED Provider Notes (Signed)
History     CSN: [id number] Arrival date & time: 08/08/2011  4:51 PM  Chief Complaint  Patient presents with  . Headache    (Consider location/radiation/quality/duration/timing/severity/associated sxs/prior treatment) HPI Pt has had migrating, intermittent pressure in her head for the past 3 weeks.  Mildly painful posterior head today.  Has been associated w/ lightheadedness, lack of balance, blurred vision and nausea.  Seen at urgent care at onset of symptoms, diagnosed w/ sinusitis, prescribed zithromax but symptoms worsened despite compliance.  Denies sinus pressure, nasal congestion and rhinorrhea.  Xray obtained by ENT and neg for sinusitis.  ENT referred her to neurology and she has an appt scheduled.  Has seen an ophthalmologist, blurred vision improved w/ new prescription glasses but then worsened again.   Denies fever.  Denies head trauma.  No h/o migraines.  No FH of MS.   Past Medical History  Diagnosis Date  . Toxic shock syndrome   . Abscess     Past Surgical History  Procedure Date  . Dilation and curettage of uterus     History reviewed. No pertinent family history.  History  Substance Use Topics  . Smoking status: Never Smoker   . Smokeless tobacco: Not on file  . Alcohol Use: No    OB History    Grav Para Term Preterm Abortions TAB SAB Ect Mult Living                  Review of Systems  All other systems reviewed and are negative.    Allergies  Penicillins and Sulfa antibiotics  Home Medications   Current Outpatient Rx  Name Route Sig Dispense Refill  . BEE POLLEN PO Oral Take 1 capsule by mouth daily.      . CO Q 10 PO Oral Take 1 capsule by mouth daily.      Marland Kitchen DIGESTIVE ENZYMES PO Oral Take 1 tablet by mouth daily.      Marland Kitchen DOCUSATE SODIUM 100 MG PO CAPS Oral Take 100 mg by mouth daily.      . MULTI-VITAMIN/MINERALS PO TABS Oral Take 1 tablet by mouth daily.      Marland Kitchen BOOST/FIBER PO Oral Take 1 tablet by mouth daily.      Marland Kitchen OVER THE COUNTER  MEDICATION Oral Take 1 tablet by mouth daily. Metabolism boost with green tea extract, chromium and garcinia      . OVER THE COUNTER MEDICATION Topical Apply 1 application topically daily. Progesterone cream        BP 144/115  Pulse 88  Temp(Src) 98.5 F (36.9 C) (Oral)  Resp 20  Ht 5\' 10"  (1.778 m)  Wt 205 lb (92.987 kg)  BMI 29.41 kg/m2  SpO2 99%  LMP 07/23/2011  Physical Exam  Nursing note and vitals reviewed. Constitutional: She is oriented to person, place, and time. She appears well-developed and well-nourished. No distress.  HENT:  Head: Normocephalic and atraumatic.  Eyes:       Normal appearance  Neck: Normal range of motion.       No meningeal signs  Cardiovascular: Normal rate and regular rhythm.   Pulmonary/Chest: Effort normal and breath sounds normal.  Musculoskeletal: Normal range of motion.  Neurological: She is alert and oriented to person, place, and time. She has normal reflexes. She displays no tremor. No cranial nerve deficit or sensory deficit. She displays a negative Romberg sign. Coordination and gait normal.       5/5 and equal upper and lower extremity strength.  No past pointing.  No pronator drift.  No nystagmus.   Skin: Skin is warm and dry. No rash noted.  Psychiatric: She has a normal mood and affect. Her behavior is normal.    ED Course  Procedures (including critical care time)  Labs Reviewed  CBC - Abnormal; Notable for the following:    RBC 5.86 (*)    MCV 69.8 (*)    MCH 23.0 (*)    RDW 15.9 (*)    All other components within normal limits  BASIC METABOLIC PANEL - Abnormal; Notable for the following:    Glucose, Bld 62 (*)    All other components within normal limits  DIFFERENTIAL  PREGNANCY, URINE   Ct Head Wo Contrast  08/08/2011  *RADIOLOGY REPORT*  Clinical Data: 40 year old female with headache and dizziness.  CT HEAD WITHOUT CONTRAST  Technique:  Contiguous axial images were obtained from the base of the skull through the  vertex without contrast.  Comparison: None  Findings: No intracranial abnormalities are identified, including mass lesion or mass effect, hydrocephalus, extra-axial fluid collection, midline shift, hemorrhage, or acute infarction.  The visualized bony calvarium is unremarkable. The visualized paranasal sinuses are clear.  IMPRESSION: Unremarkable noncontrast head CT.  Original Report Authenticated By: Rosendo Gros, M.D.     1. Headache       MDM  Pt presents w/ intermittent headache x 3wks + lightheadedness, lack of balance and blurred vision.  No trauma.  No FH of MS. On exam, afebrile, NAD, no focal neuro deficits or meningeal signs.  CT head neg.  Labs unremarkable.  Pt likely needs an MRI.  Referred to Neuro.  Pt received 60mg  IM toradol at time of discharge.  Return precautions discussed.         Otilio Miu, [place] 08/09/11 [phone]

## 2011-08-09 NOTE — ED Provider Notes (Signed)
Medical screening examination/treatment/procedure(s) were performed by non-physician practitioner and as supervising physician I was immediately available for consultation/collaboration.  Geoffery Lyons, MD 08/09/11 [phone]

## 2011-08-10 ENCOUNTER — Other Ambulatory Visit: Payer: Self-pay | Admitting: Obstetrics and Gynecology

## 2011-08-10 ENCOUNTER — Other Ambulatory Visit: Payer: Self-pay | Admitting: Otolaryngology

## 2011-08-10 DIAGNOSIS — Z1231 Encounter for screening mammogram for malignant neoplasm of breast: Secondary | ICD-10-CM

## 2011-08-10 DIAGNOSIS — D497 Neoplasm of unspecified behavior of endocrine glands and other parts of nervous system: Secondary | ICD-10-CM

## 2011-08-13 ENCOUNTER — Ambulatory Visit
Admission: RE | Admit: 2011-08-13 | Discharge: 2011-08-13 | Disposition: A | Discharge status: Home / Self Care | Payer: 59 | Source: Ambulatory Visit | Attending: Otolaryngology | Admitting: Otolaryngology

## 2011-08-13 DIAGNOSIS — D497 Neoplasm of unspecified behavior of endocrine glands and other parts of nervous system: Secondary | ICD-10-CM

## 2011-08-19 ENCOUNTER — Ambulatory Visit
Admission: RE | Admit: 2011-08-19 | Discharge: 2011-08-19 | Disposition: A | Discharge status: Home / Self Care | Payer: 59 | Source: Ambulatory Visit | Attending: Obstetrics and Gynecology | Admitting: Obstetrics and Gynecology

## 2011-08-19 DIAGNOSIS — Z1231 Encounter for screening mammogram for malignant neoplasm of breast: Secondary | ICD-10-CM

## 2011-08-24 ENCOUNTER — Other Ambulatory Visit: Payer: Self-pay | Admitting: Otolaryngology

## 2011-08-24 ENCOUNTER — Other Ambulatory Visit (HOSPITAL_COMMUNITY)
Admission: RE | Admit: 2011-08-24 | Discharge: 2011-08-24 | Disposition: A | Discharge status: Home / Self Care | Payer: 59 | Source: Ambulatory Visit | Attending: Otolaryngology | Admitting: Otolaryngology

## 2011-08-24 DIAGNOSIS — E049 Nontoxic goiter, unspecified: Secondary | ICD-10-CM | POA: Insufficient documentation

## 2011-11-03 HISTORY — PX: BREAST BIOPSY: SHX20

## 2012-03-03 ENCOUNTER — Telehealth: Payer: Self-pay

## 2012-03-03 ENCOUNTER — Other Ambulatory Visit: Payer: Self-pay

## 2012-03-03 MED ORDER — VALACYCLOVIR HCL 500 MG PO TABS: 500.0000 mg | ORAL_TABLET | Freq: Every day | ORAL | Status: DC

## 2012-03-03 NOTE — Telephone Encounter (Signed)
Lm on vm rx rf request from pharm for valtrex faxed back with refills ,per protocol rx sent to pharm

## 2012-03-04 ENCOUNTER — Other Ambulatory Visit: Payer: Self-pay | Admitting: Obstetrics and Gynecology

## 2012-03-04 NOTE — Telephone Encounter (Signed)
Routed to triage 

## 2012-03-09 NOTE — Telephone Encounter (Signed)
TC TO PT TO MAKE SURE SHE KNEW THAT HER REQUEST FOR HER RX WAS FAXED TO HER PHARM BY NG, PT INFORMED A MSG WAS LEFT ON VM TO MAKE HER AWARE, PT VOICES UNDERSTANDING, WILL CALL BACK IF HAVE ANY CONCERNS.

## 2012-07-20 ENCOUNTER — Other Ambulatory Visit: Payer: Self-pay | Admitting: Obstetrics and Gynecology

## 2012-07-20 DIAGNOSIS — Z1231 Encounter for screening mammogram for malignant neoplasm of breast: Secondary | ICD-10-CM

## 2012-08-10 ENCOUNTER — Other Ambulatory Visit: Payer: Self-pay | Admitting: Otolaryngology

## 2012-08-10 DIAGNOSIS — E041 Nontoxic single thyroid nodule: Secondary | ICD-10-CM

## 2012-09-12 ENCOUNTER — Ambulatory Visit
Admission: RE | Admit: 2012-09-12 | Discharge: 2012-09-12 | Disposition: A | Discharge status: Home / Self Care | Payer: 59 | Source: Ambulatory Visit | Attending: Obstetrics and Gynecology | Admitting: Obstetrics and Gynecology

## 2012-09-12 DIAGNOSIS — Z1231 Encounter for screening mammogram for malignant neoplasm of breast: Secondary | ICD-10-CM

## 2012-09-13 ENCOUNTER — Ambulatory Visit
Admission: RE | Admit: 2012-09-13 | Discharge: 2012-09-13 | Disposition: A | Discharge status: Home / Self Care | Payer: 59 | Source: Ambulatory Visit | Attending: Otolaryngology | Admitting: Otolaryngology

## 2012-09-13 DIAGNOSIS — E041 Nontoxic single thyroid nodule: Secondary | ICD-10-CM

## 2012-09-16 ENCOUNTER — Other Ambulatory Visit: Payer: Self-pay | Admitting: Obstetrics and Gynecology

## 2012-09-16 DIAGNOSIS — R928 Other abnormal and inconclusive findings on diagnostic imaging of breast: Secondary | ICD-10-CM

## 2012-09-26 ENCOUNTER — Ambulatory Visit
Admission: RE | Admit: 2012-09-26 | Discharge: 2012-09-26 | Disposition: A | Discharge status: Home / Self Care | Payer: 59 | Source: Ambulatory Visit | Attending: Obstetrics and Gynecology | Admitting: Obstetrics and Gynecology

## 2012-09-26 ENCOUNTER — Other Ambulatory Visit: Payer: 59

## 2012-09-26 DIAGNOSIS — R928 Other abnormal and inconclusive findings on diagnostic imaging of breast: Secondary | ICD-10-CM

## 2012-10-12 ENCOUNTER — Encounter: Payer: Self-pay | Admitting: Obstetrics and Gynecology

## 2012-10-12 DIAGNOSIS — N6489 Other specified disorders of breast: Secondary | ICD-10-CM | POA: Insufficient documentation

## 2012-10-12 HISTORY — DX: Other specified disorders of breast: N64.89

## 2012-11-02 NOTE — L&D Delivery Note (Signed)
Delivery Note  Pt rapidly progressed from 5cm to complete (approx 30 min) FHR remained reassuring Pt pushed 1 time and vtx began to crown  At 3:20 PM a viable female was delivered via Vaginal, Spontaneous Delivery (Presentation: Left Occiput Anterior).  Cord cut and clamped and infant handed to awaiting NICUE team, APGAR: 7, 7; weight 5 lb 2.9 oz (2350 g).   Placenta status: , Pathology Retained.  Cord:  with the following complications: Short.  Cord pH: n/a   Anesthesia: Epidural  Episiotomy: None Lacerations: none  Suture Repair: n/a Est. Blood Loss (mL): approx 100cc prior to Dr Su Hilt arrival at [facility] At approx 1hour after delivery, placenta not delivered, very little vaginal bleeding noted, vital signs were stable  Dr Su Hilt arrived at [facility] and attempt at manual extraction of placenta unsuccessful,  Pt immediately taken to OR for manual extraction of placenta (see op note)    Mom to OR per dr Su Hilt, baby to NICU (stable)   Geroge Gilliam M 09/02/2013, 8:31 AM

## 2013-01-05 ENCOUNTER — Telehealth: Payer: Self-pay | Admitting: Obstetrics and Gynecology

## 2013-01-05 NOTE — Telephone Encounter (Signed)
Spoke with pt rgd concerns pt states having lower back pain radiating from lower back to pelvic pt concerned it pain is related to uti or cyst no fever and haven't taken anything or pain  wants eval offered pt an app for 3/7 pt declined pt will go to urgent care

## 2013-01-13 ENCOUNTER — Encounter: Payer: Self-pay | Admitting: Certified Nurse Midwife

## 2013-02-20 ENCOUNTER — Other Ambulatory Visit: Payer: Self-pay | Admitting: Obstetrics and Gynecology

## 2013-02-28 LAB — OB RESULTS CONSOLE HGB/HCT, BLOOD
HCT: 38 %
Hemoglobin: 12.2 g/dL

## 2013-02-28 LAB — OB RESULTS CONSOLE GC/CHLAMYDIA
Chlamydia: NEGATIVE
Gonorrhea: NEGATIVE

## 2013-02-28 LAB — OB RESULTS CONSOLE PLATELET COUNT: Platelets: 162 10*3/uL

## 2013-02-28 LAB — OB RESULTS CONSOLE RPR: RPR: NONREACTIVE

## 2013-02-28 LAB — OB RESULTS CONSOLE HIV ANTIBODY (ROUTINE TESTING): HIV: NONREACTIVE

## 2013-08-25 ENCOUNTER — Inpatient Hospital Stay (HOSPITAL_COMMUNITY)
Admission: AD | Admit: 2013-08-25 | Discharge: 2013-09-03 | DRG: 767 | Disposition: A | Discharge status: Home / Self Care | Payer: BC Managed Care – PPO | Source: Ambulatory Visit | Attending: Obstetrics and Gynecology | Admitting: Obstetrics and Gynecology

## 2013-08-25 ENCOUNTER — Inpatient Hospital Stay (HOSPITAL_COMMUNITY): Payer: BC Managed Care – PPO

## 2013-08-25 ENCOUNTER — Encounter (HOSPITAL_COMMUNITY): Payer: Self-pay | Admitting: Family

## 2013-08-25 DIAGNOSIS — A6000 Herpesviral infection of urogenital system, unspecified: Secondary | ICD-10-CM | POA: Diagnosis present

## 2013-08-25 DIAGNOSIS — IMO0002 Reserved for concepts with insufficient information to code with codable children: Secondary | ICD-10-CM | POA: Insufficient documentation

## 2013-08-25 DIAGNOSIS — Z8759 Personal history of other complications of pregnancy, childbirth and the puerperium: Secondary | ICD-10-CM | POA: Insufficient documentation

## 2013-08-25 DIAGNOSIS — E669 Obesity, unspecified: Secondary | ICD-10-CM | POA: Insufficient documentation

## 2013-08-25 DIAGNOSIS — D696 Thrombocytopenia, unspecified: Secondary | ICD-10-CM | POA: Diagnosis present

## 2013-08-25 DIAGNOSIS — O4290 Premature rupture of membranes, unspecified as to length of time between rupture and onset of labor, unspecified weeks of gestation: Principal | ICD-10-CM | POA: Diagnosis present

## 2013-08-25 DIAGNOSIS — O42913 Preterm premature rupture of membranes, unspecified as to length of time between rupture and onset of labor, third trimester: Secondary | ICD-10-CM | POA: Diagnosis present

## 2013-08-25 DIAGNOSIS — B009 Herpesviral infection, unspecified: Secondary | ICD-10-CM | POA: Diagnosis present

## 2013-08-25 DIAGNOSIS — Z9889 Other specified postprocedural states: Secondary | ICD-10-CM | POA: Insufficient documentation

## 2013-08-25 DIAGNOSIS — N63 Unspecified lump in unspecified breast: Secondary | ICD-10-CM

## 2013-08-25 DIAGNOSIS — O368190 Decreased fetal movements, unspecified trimester, not applicable or unspecified: Secondary | ICD-10-CM | POA: Diagnosis present

## 2013-08-25 DIAGNOSIS — Z283 Underimmunization status: Secondary | ICD-10-CM

## 2013-08-25 DIAGNOSIS — Z22330 Carrier of Group B streptococcus: Secondary | ICD-10-CM

## 2013-08-25 DIAGNOSIS — O9902 Anemia complicating childbirth: Secondary | ICD-10-CM | POA: Diagnosis present

## 2013-08-25 DIAGNOSIS — Z880 Allergy status to penicillin: Secondary | ICD-10-CM

## 2013-08-25 DIAGNOSIS — Z2839 Other underimmunization status: Secondary | ICD-10-CM

## 2013-08-25 DIAGNOSIS — O42919 Preterm premature rupture of membranes, unspecified as to length of time between rupture and onset of labor, unspecified trimester: Secondary | ICD-10-CM | POA: Diagnosis present

## 2013-08-25 DIAGNOSIS — D689 Coagulation defect, unspecified: Secondary | ICD-10-CM | POA: Diagnosis present

## 2013-08-25 DIAGNOSIS — O3660X0 Maternal care for excessive fetal growth, unspecified trimester, not applicable or unspecified: Secondary | ICD-10-CM | POA: Diagnosis present

## 2013-08-25 DIAGNOSIS — Z8619 Personal history of other infectious and parasitic diseases: Secondary | ICD-10-CM

## 2013-08-25 DIAGNOSIS — O98519 Other viral diseases complicating pregnancy, unspecified trimester: Secondary | ICD-10-CM | POA: Diagnosis present

## 2013-08-25 DIAGNOSIS — D649 Anemia, unspecified: Secondary | ICD-10-CM | POA: Diagnosis present

## 2013-08-25 DIAGNOSIS — O09529 Supervision of elderly multigravida, unspecified trimester: Secondary | ICD-10-CM | POA: Diagnosis present

## 2013-08-25 DIAGNOSIS — Z8659 Personal history of other mental and behavioral disorders: Secondary | ICD-10-CM | POA: Insufficient documentation

## 2013-08-25 DIAGNOSIS — O99820 Streptococcus B carrier state complicating pregnancy: Secondary | ICD-10-CM

## 2013-08-25 DIAGNOSIS — O09299 Supervision of pregnancy with other poor reproductive or obstetric history, unspecified trimester: Secondary | ICD-10-CM | POA: Insufficient documentation

## 2013-08-25 DIAGNOSIS — O99892 Other specified diseases and conditions complicating childbirth: Secondary | ICD-10-CM | POA: Diagnosis present

## 2013-08-25 HISTORY — DX: HELLP syndrome (HELLP), unspecified trimester: O14.20

## 2013-08-25 HISTORY — DX: Personal history of other infectious and parasitic diseases: Z86.19

## 2013-08-25 HISTORY — DX: Inflammatory disease of uterus, unspecified: N71.9

## 2013-08-25 HISTORY — DX: Unspecified lump in unspecified breast: N63.0

## 2013-08-25 HISTORY — DX: Eclampsia, unspecified as to time period: O15.9

## 2013-08-25 HISTORY — DX: Irritable bowel syndrome, unspecified: K58.9

## 2013-08-25 HISTORY — DX: Herpesviral infection, unspecified: B00.9

## 2013-08-25 HISTORY — DX: Major depressive disorder, single episode, unspecified: F32.9

## 2013-08-25 HISTORY — DX: Depression, unspecified: F32.A

## 2013-08-25 HISTORY — DX: Other underimmunization status: Z28.39

## 2013-08-25 HISTORY — DX: Other instability, unspecified knee: M25.369

## 2013-08-25 HISTORY — DX: Personal history of other complications of pregnancy, childbirth and the puerperium: Z87.59

## 2013-08-25 HISTORY — DX: Other specified postprocedural states: Z98.890

## 2013-08-25 LAB — TYPE AND SCREEN: Antibody Screen: NEGATIVE

## 2013-08-25 LAB — AMNISURE RUPTURE OF MEMBRANE (ROM) NOT AT ARMC: Amnisure ROM: POSITIVE

## 2013-08-25 LAB — CBC
HCT: 32.5 % — ABNORMAL LOW (ref 36.0–46.0)
Hemoglobin: 10.8 g/dL — ABNORMAL LOW (ref 12.0–15.0)
MCHC: 33.2 g/dL (ref 30.0–36.0)
RDW: 15.7 % — ABNORMAL HIGH (ref 11.5–15.5)
WBC: 7.7 10*3/uL (ref 4.0–10.5)

## 2013-08-25 LAB — COMPREHENSIVE METABOLIC PANEL
Albumin: 2.7 g/dL — ABNORMAL LOW (ref 3.5–5.2)
Alkaline Phosphatase: 130 U/L — ABNORMAL HIGH (ref 39–117)
BUN: 5 mg/dL — ABNORMAL LOW (ref 6–23)
Calcium: 9 mg/dL (ref 8.4–10.5)
Creatinine, Ser: 0.5 mg/dL (ref 0.50–1.10)
GFR calc Af Amer: >90 mL/min (ref >90)
GFR calc non Af Amer: >90 mL/min (ref >90)
Glucose, Bld: 89 mg/dL (ref 70–99)
Potassium: 3.9 mEq/L (ref 3.5–5.1)
Total Protein: 5.5 g/dL — ABNORMAL LOW (ref 6.0–8.3)

## 2013-08-25 LAB — OB RESULTS CONSOLE GC/CHLAMYDIA
Chlamydia: NEGATIVE
Gonorrhea: NEGATIVE

## 2013-08-25 LAB — PROTEIN / CREATININE RATIO, URINE
Creatinine, Urine: 20.44 mg/dL
Protein Creatinine Ratio: 0.25 — ABNORMAL HIGH (ref 0.00–0.15)
Total Protein, Urine: 5.1 mg/dL

## 2013-08-25 LAB — LACTATE DEHYDROGENASE: LDH: 189 U/L (ref 94–250)

## 2013-08-25 LAB — OB RESULTS CONSOLE GBS: GBS: POSITIVE

## 2013-08-25 LAB — WET PREP, GENITAL
Clue Cells Wet Prep HPF POC: NONE SEEN
Yeast Wet Prep HPF POC: NONE SEEN

## 2013-08-25 MED ORDER — NITROFURANTOIN MACROCRYSTAL 50 MG PO CAPS
100.0000 mg | ORAL_CAPSULE | Freq: Every day | ORAL | Status: DC
  Administered 2013-08-27 – 2013-08-30 (×4): 100 mg via ORAL
  Filled 2013-08-25 (×4): qty 2

## 2013-08-25 MED ORDER — ZOLPIDEM TARTRATE 5 MG PO TABS
5.0000 mg | ORAL_TABLET | Freq: Every evening | ORAL | Status: DC | PRN
  Administered 2013-08-25 – 2013-08-31 (×7): 5 mg via ORAL
  Filled 2013-08-25 (×7): qty 1

## 2013-08-25 MED ORDER — DOCUSATE SODIUM 100 MG PO CAPS
100.0000 mg | ORAL_CAPSULE | Freq: Every day | ORAL | Status: DC
  Administered 2013-08-25 – 2013-08-31 (×7): 100 mg via ORAL
  Filled 2013-08-25 (×8): qty 1

## 2013-08-25 MED ORDER — GENTAMICIN SULFATE 40 MG/ML IJ SOLN
7.0000 mg/kg | INTRAVENOUS | Status: AC
  Administered 2013-08-25 – 2013-08-26 (×2): 580 mg via INTRAVENOUS
  Filled 2013-08-25 (×2): qty 14.5

## 2013-08-25 MED ORDER — VALACYCLOVIR HCL 500 MG PO TABS
500.0000 mg | ORAL_TABLET | Freq: Every day | ORAL | Status: DC
  Administered 2013-08-25 – 2013-09-01 (×8): 500 mg via ORAL
  Filled 2013-08-25 (×11): qty 1

## 2013-08-25 MED ORDER — ACETAMINOPHEN 325 MG PO TABS
650.0000 mg | ORAL_TABLET | ORAL | Status: DC | PRN
  Administered 2013-08-27 – 2013-08-29 (×3): 650 mg via ORAL
  Filled 2013-08-25 (×4): qty 2

## 2013-08-25 MED ORDER — AZITHROMYCIN 500 MG PO TABS
1000.0000 mg | ORAL_TABLET | Freq: Once | ORAL | Status: AC
  Administered 2013-08-25: 1000 mg via ORAL
  Filled 2013-08-25 (×2): qty 2

## 2013-08-25 MED ORDER — LACTATED RINGERS IV SOLN
INTRAVENOUS | Status: DC
  Administered 2013-08-25 – 2013-08-27 (×4): via INTRAVENOUS

## 2013-08-25 MED ORDER — CALCIUM CARBONATE ANTACID 500 MG PO CHEW
2.0000 | CHEWABLE_TABLET | ORAL | Status: DC | PRN
  Administered 2013-08-27: 400 mg via ORAL
  Filled 2013-08-25: qty 2
  Filled 2013-08-25 (×2): qty 1

## 2013-08-25 MED ORDER — NITROFURANTOIN MACROCRYSTAL 100 MG PO CAPS: 100.0000 mg | ORAL_CAPSULE | Freq: Every day | ORAL | Status: DC

## 2013-08-25 MED ORDER — PRENATAL MULTIVITAMIN CH
1.0000 | ORAL_TABLET | Freq: Every day | ORAL | Status: DC
  Administered 2013-08-25 – 2013-08-31 (×7): 1 via ORAL
  Filled 2013-08-25 (×8): qty 1

## 2013-08-25 MED ORDER — CLINDAMYCIN HCL 300 MG PO CAPS
300.0000 mg | ORAL_CAPSULE | Freq: Three times a day (TID) | ORAL | Status: DC
  Administered 2013-08-27 – 2013-08-31 (×14): 300 mg via ORAL
  Filled 2013-08-25 (×15): qty 1

## 2013-08-25 MED ORDER — ENOXAPARIN SODIUM 60 MG/0.6ML ~~LOC~~ SOLN
50.0000 mg | SUBCUTANEOUS | Status: DC
  Administered 2013-08-25 – 2013-08-27 (×3): 50 mg via SUBCUTANEOUS
  Administered 2013-08-28: 22:00:00 via SUBCUTANEOUS
  Administered 2013-08-29 – 2013-08-31 (×3): 50 mg via SUBCUTANEOUS
  Filled 2013-08-25 (×7): qty 0.6

## 2013-08-25 MED ORDER — BETAMETHASONE SOD PHOS & ACET 6 (3-3) MG/ML IJ SUSP
12.0000 mg | INTRAMUSCULAR | Status: AC
  Administered 2013-08-25 – 2013-08-26 (×2): 12 mg via INTRAMUSCULAR
  Filled 2013-08-25 (×2): qty 2

## 2013-08-25 MED ORDER — CLINDAMYCIN PHOSPHATE 900 MG/50ML IV SOLN
900.0000 mg | Freq: Three times a day (TID) | INTRAVENOUS | Status: AC
  Administered 2013-08-25 – 2013-08-27 (×6): 900 mg via INTRAVENOUS
  Filled 2013-08-25 (×6): qty 50

## 2013-08-25 NOTE — MAU Note (Addendum)
42 yo, G8P4 at [redacted]w[redacted]d, all prior vag deliveries, presents to MAU with c/o possible ROM clear fluid at 0800 today. Reports she was lying in bed when she felt a gush of fluid.  Denies VB. Reports last fetal movement yesterday. Reports infrequent UCs. Last sexual intercourse 2 days ago. Reports HSV - has not been taking Valtrex, but denies lesions, pain at perineum, or prodromal s/s.

## 2013-08-25 NOTE — Progress Notes (Signed)
ANTICOAGULATION CONSULT NOTE - Initial Consult  Pharmacy Consult for Lovenox Indication: VTE prophylaxis  Allergies  Allergen Reactions  . Penicillins Hives  . Sulfa Antibiotics Hives    Patient Measurements: Height: 5\' 10"  (177.8 cm) Weight: 233 lb (105.688 kg) IBW/kg (Calculated) : 68.5   Vital Signs: Temp: 98.7 F (37.1 C) (10/24 2000) Temp src: Oral (10/24 2000) BP: 128/59 mmHg (10/24 2000) Pulse Rate: 103 (10/24 2000)  Labs:  Recent Labs  08/25/13 1110  HGB 10.8*  HCT 32.5*  PLT 141*  CREATININE 0.50    Estimated Creatinine Clearance: 119.4 ml/min (by C-G formula based on Cr of 0.5).   Medical History: Past Medical History  Diagnosis Date  . Toxic shock syndrome   . Abscess   . Hypertension   . Eclampsia   . Depression     PPD  . HSV-2 (herpes simplex virus 2) infection   . HELLP (hemolytic anemia/elev liver enzymes/low platelets in pregnancy)   . Breast lump   . Postpartum hemorrhage   . Endometritis   . Knee instability   . IBS (irritable bowel syndrome)     Medications:    Assessment: 42yo F at 32+ weeks admitted for PPROM. Lovenox therapy initiated due to prolonged bedrest due to PPROM at 32 weeks.  Goal of Therapy: Monitor pt for s/s of bleeding and plan for delivery.   Plan:  1. Lovenox 50mg  sq q24h. Dose based on 0.5mg /kg due to BMI > 30. 2. Will continue to follow and assess need for follow up.  Thanks!  Claybon Jabs 08/25/2013,10:44 PM

## 2013-08-25 NOTE — Progress Notes (Signed)
Tiffany Sharp is a 42 y.o. [id number] at [redacted]w[redacted]d admitted for PROM.  Subjective:  Sad but OK.  Objective:  BP 120/75  Pulse 108  Temp(Src) 98.2 F (36.8 C) (Oral)  Resp 18    Labs:  Lab Results  Component Value Date   WBC 7.7 08/25/2013   HGB 10.8* 08/25/2013   HCT 32.5* 08/25/2013   MCV 72.2* 08/25/2013   PLT 141* 08/25/2013    Assessment / Plan:  PROM at 32 6/7 weeks.  Management was discussed with the patient and her husband. We will give antibiotics, Valtrex, and betamethasone for now. An ultrasound is scheduled to document fetal position. If mother and baby remained stable, then we will consider delivery at [redacted] weeks gestation.  Verdene Creson V 08/25/2013, 11:46 AM

## 2013-08-25 NOTE — Progress Notes (Signed)
42 y.o. year old female,at [redacted]w[redacted]d gestation.  SUBJECTIVE:  Doing well. Continues to leak fluid.  OBJECTIVE:  BP 122/67  Pulse 117  Temp(Src) 98.5 F (36.9 C) (Oral)  Resp 18  Ht 5\' 10"  (1.778 m)  Wt 233 lb (105.688 kg)  BMI 33.43 kg/m2  SpO2 97%  Fetal Heart Tones:  Category 1  Contractions:          Few and mild  Beta strep culture: Positive  Ultrasound: Single gestation, vertex, decreased fluid, cervix 3.2 cm.  Results for orders placed during the hospital encounter of 08/25/13 (from the past 24 hour(s))  OB RESULTS CONSOLE GBS     Status: None   Collection Time    08/25/13 12:00 AM      Result Value Range   GBS Positive    AMNISURE RUPTURE OF MEMBRANE (ROM)     Status: None   Collection Time    08/25/13  9:20 AM      Result Value Range   Amnisure ROM POSITIVE    COMPREHENSIVE METABOLIC PANEL     Status: Abnormal   Collection Time    08/25/13 11:10 AM      Result Value Range   Sodium 139  135 - 145 mEq/L   Potassium 3.9  3.5 - 5.1 mEq/L   Chloride 108  96 - 112 mEq/L   CO2 21  19 - 32 mEq/L   Glucose, Bld 89  70 - 99 mg/dL   BUN 5 (*) 6 - 23 mg/dL   Creatinine, Ser 1.61  0.50 - 1.10 mg/dL   Calcium 9.0  8.4 - 09.6 mg/dL   Total Protein 5.5 (*) 6.0 - 8.3 g/dL   Albumin 2.7 (*) 3.5 - 5.2 g/dL   AST 20  0 - 37 U/L   ALT 18  0 - 35 U/L   Alkaline Phosphatase 130 (*) 39 - 117 U/L   Total Bilirubin 0.7  0.3 - 1.2 mg/dL   GFR calc non Af Amer >90  >90 mL/min   GFR calc Af Amer >90  >90 mL/min  CBC     Status: Abnormal   Collection Time    08/25/13 11:10 AM      Result Value Range   WBC 7.7  4.0 - 10.5 K/uL   RBC 4.50  3.87 - 5.11 MIL/uL   Hemoglobin 10.8 (*) 12.0 - 15.0 g/dL   HCT 04.5 (*) 40.9 - 81.1 %   MCV 72.2 (*) 78.0 - 100.0 fL   MCH 24.0 (*) 26.0 - 34.0 pg   MCHC 33.2  30.0 - 36.0 g/dL   RDW 91.4 (*) 78.2 - 95.6 %   Platelets 141 (*) 150 - 400 K/uL  URIC ACID     Status: None   Collection Time    08/25/13 11:10 AM      Result Value Range   Uric Acid, Serum 4.3  2.4 - 7.0 mg/dL  LACTATE DEHYDROGENASE     Status: None   Collection Time    08/25/13 11:10 AM      Result Value Range   LDH 189  94 - 250 U/L  TYPE AND SCREEN     Status: None   Collection Time    08/25/13 11:10 AM      Result Value Range   ABO/RH(D) A POS     Antibody Screen NEG     Sample Expiration 08/28/2013    PROTEIN / CREATININE RATIO, URINE     Status: Abnormal  Collection Time    08/25/13  1:55 PM      Result Value Range   Creatinine, Urine 20.44     Total Protein, Urine 5.1     PROTEIN CREATININE RATIO 0.25 (*) 0.00 - 0.15  WET PREP, GENITAL     Status: Abnormal   Collection Time    08/25/13  2:15 PM      Result Value Range   Yeast Wet Prep HPF POC NONE SEEN  NONE SEEN   Trich, Wet Prep NONE SEEN  NONE SEEN   Clue Cells Wet Prep HPF POC NONE SEEN  NONE SEEN   WBC, Wet Prep HPF POC FEW (*) NONE SEEN  GROUP B STREP BY PCR     Status: Abnormal   Collection Time    08/25/13  2:15 PM      Result Value Range   Group B strep by PCR POSITIVE (*) NEGATIVE     ASSESSMENT:  [redacted]w[redacted]d Weeks Pregnancy  Premature and preterm rupture membranes  Anemia  PLAN:  The patient requests that we put a Foley catheter in the bladder to decrease the need to be up to the bathroom.  We will begin prophylactic Lovenox.  Continue in-hospital observation with plans as mentioned previously.  Leonard Schwartz, M.D.

## 2013-08-25 NOTE — Consult Note (Signed)
Neonatology Consult to Antenatal Patient:  I was asked by Dr. Stefano Gaul to see this patient in order to provide antenatal counseling due to SROM at 32 6/7 weeks.  Ms. Obeirne is admitted today at 72 6/[redacted] weeks GA following SROM. She is 42 yo [id number] She is currently not having active labor. She is getting BMZ and IV antibiotics, as well as Valtrex (history of HSV). The baby is female and EFW is 2661 grams by today's ultrasound. Mother has a history of large babies and is not diabetic. GBS positive.  I spoke with the patient and her husband. We discussed the worst case of delivery in the next 1-2 days, including usual DR management, possible respiratory complications and need for support, IV access, feedings (mother desires breast feeding, which was encouraged), LOS, Mortality and Morbidity, and long term outcomes. They had a few questions which I answered. I offered a NICU tour to any interested family members and would be glad to come back if they have more questions later.  Thank you for asking me to see this delightful patient.  Doretha Sou, MD Neonatologist  The total length of face-to-face or floor/unit time for this encounter was 25 minutes. Counseling and/or coordination of care was 18 minutes of the above.

## 2013-08-25 NOTE — Progress Notes (Signed)
Timeout performed with Hart Rochester, RN, prior to foley catheter insertion.

## 2013-08-25 NOTE — H&P (Signed)
Tiffany Sharp is a 42 y.o. female, [id number] at [redacted]w[redacted]d, presenting for PPROM.  Pt reports UCs however they are consistant to the UCs she's been feeling since about 26 weeks.  Pt also reports not feeling FM since yesterday.  Denies VB, recent fever, resp or GI c/o's, UTI or PIH s/s .   Patient Active Problem List   Diagnosis Date Noted  . HSV (herpes simplex virus) infection 08/25/2013  . H/O HELLP syndrome, currently pregnant 08/25/2013  . H/O PPH (postpartum hemorrhage) 08/25/2013  . Preterm premature rupture of membranes (PPROM) delivered, current hospitalization 08/25/2013  . Anemia 08/25/2013  . Breast lump 08/25/2013  . Allergy to penicillin 08/25/2013  . Allergy to sulfonamides 08/25/2013  . Rubella non-immune status, antepartum 08/25/2013  . History of postpartum depression, currently pregnant 08/25/2013  . History of eclampsia 08/25/2013  . Advanced maternal age (AMA), 40 years or greater 08/25/2013  . Obesity, unspecified 08/25/2013  . History of toxic shock syndrome 08/25/2013  . History of loop electrical excision procedure (LEEP) 08/25/2013  . H/O LGA (large for gestational age) fetus 08/25/2013  . Breast asymmetry in female 10/12/2012    History of present pregnancy: Patient entered care at 11 weeks.   EDC of 10/19/13 was established by LMP.   Anatomy scan:  18 weeks, with normal findings and an posterior fundal placenta.   Additional [place] evaluations:  6 week u/s for viability.   Significant prenatal events:  PPROM at 32 weeks   Last evaluation:  08/11/13 at [redacted]w[redacted]d   0cm / 0%  OB History   Grav Para Term Preterm Abortions TAB SAB Ect Mult Living   8 4   3  3   4      Past Medical History  Diagnosis Date  . Toxic shock syndrome   . Abscess   . Hypertension   . Eclampsia   . Depression     PPD  . HSV-2 (herpes simplex virus 2) infection   . HELLP (hemolytic anemia/elev liver enzymes/low platelets in pregnancy)   . Breast lump   . Postpartum hemorrhage   .  Endometritis   . Knee instability   . IBS (irritable bowel syndrome)    Past Surgical History  Procedure Laterality Date  . Dilation and curettage of uterus     Family History: family history is not on file. Social History:  reports that she has never smoked. She does not have any smokeless tobacco history on file. She reports that she does not drink alcohol or use illicit drugs.   Prenatal Transfer Tool  Maternal Diabetes: No Genetic Screening: Declined Maternal Ultrasounds/Referrals: Normal Fetal Ultrasounds or other Referrals:  None Maternal Substance Abuse:  No Significant Maternal Medications:  None Significant Maternal Lab Results: Lab values include: Other: GBS, GC/CT, and wet prep pending    ROS: see HPI above, all other systems are negative  Allergies  Allergen Reactions  . Penicillins Hives  . Sulfa Antibiotics Hives       Blood pressure 135/60, pulse 99, temperature 98.2 F (36.8 C), temperature source Oral, resp. rate 20, height 5\' 10"  (1.778 m), weight 233 lb (105.688 kg).  Chest clear Heart RRR without murmur Abd gravid, NT Ext: WNL  Dilation: Closed Effacement (%): 60 Cervical Position: Middle Station: -2;Ballotable Presentation: Vertex Exam by:: Jayel Scaduto, CNM No lesions noted on SVE   FHR: Reassuring; CTO UCs:  Occasional; pt reports 2 stronger UCs since arriving  U/S: SIUP; vtx; anterior fundal placenta; AFI 4th%ile; EFW 90th%ile, 4696E  5lb4oz  Prenatal labs: ABO, Rh: A/Positive/-- (04/29 0000) Antibody: Negative (04/29 0000) Rubella:   NON-IMMUNE RPR: Nonreactive (04/29 0000)  HBsAg: Negative (04/29 0000)  HIV: Non-reactive (04/29 0000)  GBS:  Collected today Sickle cell/Hgb electrophoresis:  n/a Pap:  Last pap 02/20/13 - WNL GC:  Neg Chlamydia:  Neg Genetic screenings:  Declined Glucola:  Early - 82;  28 weeks - 82 Other:  none  Results for orders placed during the hospital encounter of 08/25/13 (from the past 24 hour(s))  AMNISURE  RUPTURE OF MEMBRANE (ROM)     Status: None   Collection Time    08/25/13  9:20 AM      Result Value Range   Amnisure ROM POSITIVE    COMPREHENSIVE METABOLIC PANEL     Status: Abnormal   Collection Time    08/25/13 11:10 AM      Result Value Range   Sodium 139  135 - 145 mEq/L   Potassium 3.9  3.5 - 5.1 mEq/L   Chloride 108  96 - 112 mEq/L   CO2 21  19 - 32 mEq/L   Glucose, Bld 89  70 - 99 mg/dL   BUN 5 (*) 6 - 23 mg/dL   Creatinine, Ser 1.61  0.50 - 1.10 mg/dL   Calcium 9.0  8.4 - 09.6 mg/dL   Total Protein 5.5 (*) 6.0 - 8.3 g/dL   Albumin 2.7 (*) 3.5 - 5.2 g/dL   AST 20  0 - 37 U/L   ALT 18  0 - 35 U/L   Alkaline Phosphatase 130 (*) 39 - 117 U/L   Total Bilirubin 0.7  0.3 - 1.2 mg/dL   GFR calc non Af Amer >90  >90 mL/min   GFR calc Af Amer >90  >90 mL/min  CBC     Status: Abnormal   Collection Time    08/25/13 11:10 AM      Result Value Range   WBC 7.7  4.0 - 10.5 K/uL   RBC 4.50  3.87 - 5.11 MIL/uL   Hemoglobin 10.8 (*) 12.0 - 15.0 g/dL   HCT 04.5 (*) 40.9 - 81.1 %   MCV 72.2 (*) 78.0 - 100.0 fL   MCH 24.0 (*) 26.0 - 34.0 pg   MCHC 33.2  30.0 - 36.0 g/dL   RDW 91.4 (*) 78.2 - 95.6 %   Platelets 141 (*) 150 - 400 K/uL  URIC ACID     Status: None   Collection Time    08/25/13 11:10 AM      Result Value Range   Uric Acid, Serum 4.3  2.4 - 7.0 mg/dL  LACTATE DEHYDROGENASE     Status: None   Collection Time    08/25/13 11:10 AM      Result Value Range   LDH 189  94 - 250 U/L  TYPE AND SCREEN     Status: None   Collection Time    08/25/13 11:10 AM      Result Value Range   ABO/RH(D) A POS     Antibody Screen PENDING     Sample Expiration 08/28/2013    WET PREP, GENITAL     Status: Abnormal   Collection Time    08/25/13  2:15 PM      Result Value Range   Yeast Wet Prep HPF POC NONE SEEN  NONE SEEN   Trich, Wet Prep NONE SEEN  NONE SEEN   Clue Cells Wet Prep HPF POC NONE SEEN  NONE SEEN  WBC, Wet Prep HPF POC FEW (*) NONE SEEN    Assessment/Plan: IUP at  [redacted]w[redacted]d PPROM with occational UCs Decreased FM  Admit to Antenatal per c/w Dr. Stefano Gaul as attending MD U/S Betamethasone 1 dose now then 2nd dose in 24 hours Labs - GBS, GC/CT, wet prep, urine culture Neonatologist consult ABX therapy per Up to Date recommendations for PPROM Valtrex 500 mg QD   Rowan Blase, MSN 08/25/2013, 12:20 PM

## 2013-08-26 ENCOUNTER — Encounter (HOSPITAL_COMMUNITY): Payer: Self-pay | Admitting: Obstetrics and Gynecology

## 2013-08-26 LAB — URINE CULTURE: Culture: NO GROWTH

## 2013-08-26 LAB — GC/CHLAMYDIA PROBE AMP
CT Probe RNA: NEGATIVE
GC Probe RNA: NEGATIVE

## 2013-08-26 NOTE — Progress Notes (Signed)
PROGRESS NOTE  I have reviewed the patient's vital signs, labs, and notes. I agree with the previous note from the Certified Nurse Midwife.  Linkoln Alkire Vernon Tonee Silverstein, M.D. 08/26/2013  

## 2013-08-26 NOTE — Progress Notes (Addendum)
Hospital day # 1 pregnancy at [redacted]w[redacted]d by 6 week US--PPROM 08/25/13.  S:  Doing well, reports good fetal activity      Perception of contractions: very occasional      Vaginal bleeding: None       Vaginal discharge:  Leaking small amount clear fluid, no significant change       Reviewed dating criteria:      LMP 01/14/13 = EDC 10/21/13 (determined by patient remembrance after initial US done).      Initial [place] on 02/23/13 due to unknown LMP at that time = [redacted] weeks gestation, [facility] 10/19/13.      Anatomy US on 05/24/13 =19 4/7 weeks, EDC 10/14/13  O: BP 128/63  Pulse 101  Temp(Src) 98.9 F (37.2 C) (Oral)  Resp 20  Ht 5\' 10"  (1.778 m)  Wt 233 lb (105.688 kg)  BMI 33.43 kg/m2  SpO2 97%      Fetal tracings:  Category 1      Contractions:   None per patient.      Uterus non-tender      Extremities: no significant edema and no signs of DVT--on Lovenox and SCDs in place.                Meds: ATB regimen for PROM                 2nd dose betamethasone due at 11am today                 Valtrex for suppressive tx--denies lesions or prodrome  A: [redacted]w[redacted]d with PPROM     Stable  P: Continue current plan of care      Upcoming tests/treatments:  Continue ATB regimen for PPROM.      Support to patient for long-term hospitalization issues.      MDs will follow  Nigel Bridgeman CNM, [place] 08/26/2013 9:28 AM

## 2013-08-27 NOTE — Progress Notes (Signed)
Patient ID: Tiffany Sharp, female   DOB: [birth year], 42 y.o.   MRN: [id number]  Hospital day # 2 pregnancy at [redacted]w[redacted]d, PPROM at [redacted]w[redacted]d on 10/24   S: well, reports good fetal activity      Contractions:rare      Vaginal bleeding:spotting, had red streak on pad this morning       Vaginal discharge: no significant change  O: BP 122/67  Pulse 86  Temp(Src) 99 F (37.2 C) (Oral)  Resp 18  Ht 5\' 10"  (1.778 m)  Wt 233 lb (105.688 kg)  BMI 33.43 kg/m2  SpO2 97%  LMP 01/14/2013      Fetal tracings:cat 1      Uterus non-tender      Extremities: no significant edema and no signs of DVT Has foley in place, draining lg amt, urine dark at present, pt reports seeing some blood in catheter, as well,   A: [redacted]w[redacted]d with PPROM      Stable   P: continue current plan of care D/w Dr Stefano Gaul, will CTO urine at present Quinntin Malter M  CNM  08/27/2013 11:42 AM

## 2013-08-27 NOTE — Progress Notes (Signed)
PROGRESS NOTE  I have reviewed the patient's vital signs, labs, and notes. I have seen the patient. I agree with the previous note from the Certified Nurse Midwife.  Lamoine Magallon Vernon Jakyria Bleau, M.D. 08/27/2013  

## 2013-08-28 ENCOUNTER — Encounter (HOSPITAL_COMMUNITY): Payer: Self-pay | Admitting: Obstetrics and Gynecology

## 2013-08-28 DIAGNOSIS — O99820 Streptococcus B carrier state complicating pregnancy: Secondary | ICD-10-CM

## 2013-08-28 DIAGNOSIS — D696 Thrombocytopenia, unspecified: Secondary | ICD-10-CM | POA: Diagnosis present

## 2013-08-28 LAB — CBC
Hemoglobin: 9.9 g/dL — ABNORMAL LOW (ref 12.0–15.0)
MCH: 23.8 pg — ABNORMAL LOW (ref 26.0–34.0)
MCHC: 32.2 g/dL (ref 30.0–36.0)
Platelets: 132 10*3/uL — ABNORMAL LOW (ref 150–400)
RDW: 16.4 % — ABNORMAL HIGH (ref 11.5–15.5)
WBC: 8.3 10*3/uL (ref 4.0–10.5)

## 2013-08-28 LAB — COMPREHENSIVE METABOLIC PANEL
ALT: 17 U/L (ref 0–35)
AST: 19 U/L (ref 0–37)
Albumin: 2.4 g/dL — ABNORMAL LOW (ref 3.5–5.2)
Alkaline Phosphatase: 106 U/L (ref 39–117)
CO2: 21 mEq/L (ref 19–32)
Calcium: 8.6 mg/dL (ref 8.4–10.5)
Creatinine, Ser: 0.56 mg/dL (ref 0.50–1.10)
GFR calc non Af Amer: >90 mL/min (ref >90)
Glucose, Bld: 87 mg/dL (ref 70–99)
Potassium: 3.8 mEq/L (ref 3.5–5.1)
Sodium: 136 mEq/L (ref 135–145)
Total Protein: 5.1 g/dL — ABNORMAL LOW (ref 6.0–8.3)

## 2013-08-28 LAB — TYPE AND SCREEN
ABO/RH(D): A POS
Antibody Screen: NEGATIVE

## 2013-08-28 MED ORDER — SODIUM CHLORIDE 0.9 % IJ SOLN
3.0000 mL | Freq: Two times a day (BID) | INTRAMUSCULAR | Status: DC
  Administered 2013-08-28 – 2013-08-31 (×8): 3 mL via INTRAVENOUS

## 2013-08-28 MED ORDER — FERROUS SULFATE 325 (65 FE) MG PO TABS
325.0000 mg | ORAL_TABLET | Freq: Two times a day (BID) | ORAL | Status: DC
  Administered 2013-08-28 – 2013-08-31 (×7): 325 mg via ORAL
  Filled 2013-08-28 (×7): qty 1

## 2013-08-28 NOTE — Progress Notes (Signed)
Patient ID: Tiffany Sharp, female   DOB: Jul 17, 1971, 42 y.o.   MRN: [id number] Tiffany Sharp is a 42 y.o. [id number] at [redacted]w[redacted]d by ultrasound admitted for PROM preterm  Subjective: GI: Denies nausea, vomiting or diarrhea., abdominal pain, constipation GU: Denies: dysuria, frequency/urgency, hematuria, pelvic pain OB: Good fetal movement        Objective: BP 121/63  Pulse 90  Temp(Src) 98.1 F (36.7 C) (Oral)  Resp 16  Ht 5\' 10"  (1.778 m)  Wt 233 lb (105.688 kg)  BMI 33.43 kg/m2  SpO2 97%  LMP 01/14/2013 I/O last 3 completed shifts: In: -  Out: 4700 [Urine:4700] Total I/O In: -  Out: 600 [Urine:600]  FHT:  FHR: 140 bpm, variability: moderate,  accelerations:  Present,  decelerations:  Absent UC:   none SVE:   Dilation: Closed Effacement (%): 60 Station: -2;Ballotable Exam by:: Oxley, CNM  Labs: Lab Results  Component Value Date   WBC 8.3 08/28/2013   HGB 9.9* 08/28/2013   HCT 30.7* 08/28/2013   MCV 73.8* 08/28/2013   PLT 132* 08/28/2013    Assessment / Plan: Preterm premature rupture of membranes at 32 weeks and 4 days No evidence of labor History of help syndrome Mild Thrombocytopenia on admission No evidence of pregnancy-induced hypertension Status post betamethasone series Group B strep positive with allergy to penicillin Vertex presentation Fetal Wellbeing:  Category I   Tiffany Sharp 08/28/2013, 3:02 PM

## 2013-08-28 NOTE — Plan of Care (Signed)
Problem: Consults Goal: Birthing Suites Patient Information Press F2 to bring up selections list   Pt < [redacted] weeks EGA     

## 2013-08-28 NOTE — Progress Notes (Signed)
Ur chart review completed.  

## 2013-08-28 NOTE — Progress Notes (Signed)
ANTICOAGULATION CONSULT NOTE - Follow Up Consult  Pharmacy Consult for Lovenox Indication: VTE prophylaxis  Allergies  Allergen Reactions  . Penicillins Hives  . Sulfa Antibiotics Hives    Patient Measurements: Height: 5\' 10"  (177.8 cm) Weight: 233 lb (105.688 kg) IBW/kg (Calculated) : 68.5  Vital Signs: Temp: 98.1 F (36.7 C) (10/27 1211) Temp src: Oral (10/27 1211) BP: 121/63 mmHg (10/27 1211) Pulse Rate: 90 (10/27 1211)  Labs:  Recent Labs  08/28/13 0540  HGB 9.9*  HCT 30.7*  PLT 132*  CREATININE 0.56    Estimated Creatinine Clearance: 119.4 ml/min (by C-G formula based on Cr of 0.56).   Medications:  Lovenox 50mg  SQ daily   Assessment: Pt is 42 yo at [redacted]w[redacted]d initiated on Lovenox for VTE prophylaxis due to prolonged immobility related to PPROM. CBC this am showed a small decrease in the H/H and platelets. No signs of bleeding. Will check another CBC in 72 hours per pharmacy protocol.   Plan:  Continue Lovenox 50mg  (0.5 mg/kg) SQ daily. Check CBC on 10/30 if one not drawn prior to this date. Will continue to monitor for s/s bleeding and plans for delivery.   Annalise Mcdiarmid [place] 08/28/2013,2:17 PM

## 2013-08-29 LAB — DIFFERENTIAL
Basophils Relative: 0 % (ref 0–1)
Eosinophils Absolute: 0.1 10*3/uL (ref 0.0–0.7)
Lymphocytes Relative: 13 % (ref 12–46)
Lymphs Abs: 1.6 10*3/uL (ref 0.7–4.0)
Monocytes Absolute: 1.3 10*3/uL — ABNORMAL HIGH (ref 0.1–1.0)
Monocytes Relative: 10 % (ref 3–12)
Neutro Abs: 9.1 10*3/uL — ABNORMAL HIGH (ref 1.7–7.7)
Neutrophils Relative %: 76 % (ref 43–77)

## 2013-08-29 LAB — CBC
HCT: 30.2 % — ABNORMAL LOW (ref 36.0–46.0)
Hemoglobin: 10.1 g/dL — ABNORMAL LOW (ref 12.0–15.0)
MCH: 24.2 pg — ABNORMAL LOW (ref 26.0–34.0)
MCHC: 33.4 g/dL (ref 30.0–36.0)
WBC: 12.1 10*3/uL — ABNORMAL HIGH (ref 4.0–10.5)

## 2013-08-30 LAB — CBC WITH DIFFERENTIAL/PLATELET
Basophils Absolute: 0 10*3/uL (ref 0.0–0.1)
Basophils Relative: 0 % (ref 0–1)
Eosinophils Absolute: 0.1 10*3/uL (ref 0.0–0.7)
Eosinophils Relative: 1 % (ref 0–5)
Hemoglobin: 10.5 g/dL — ABNORMAL LOW (ref 12.0–15.0)
MCH: 23.8 pg — ABNORMAL LOW (ref 26.0–34.0)
MCHC: 32.9 g/dL (ref 30.0–36.0)
MCV: 72.2 fL — ABNORMAL LOW (ref 78.0–100.0)
Monocytes Absolute: 1.4 10*3/uL — ABNORMAL HIGH (ref 0.1–1.0)
Monocytes Relative: 11 % (ref 3–12)
Neutrophils Relative %: 74 % (ref 43–77)
WBC: 12.4 10*3/uL — ABNORMAL HIGH (ref 4.0–10.5)

## 2013-08-30 NOTE — Progress Notes (Signed)
Patient ID: Tiffany Sharp, female   DOB: Jan 30, 1971, 43 y.o.   MRN: [id number] Pt without complaints. leakage of fluid, noVB.  Good FM  BP 119/98  Pulse 106  Temp(Src) 99.1 F (37.3 C) (Oral)  Resp 18  Ht 5\' 10"  (1.778 m)  Wt 230 lb 6.4 oz (104.509 kg)  BMI 33.06 kg/m2  SpO2 98%  LMP 01/14/2013  FHTS Category 1  Toco none  Pt in NAD CV RRR Lungs CTAB abd  Gravid soft and NT GU no vb EXt no calf tenderness Results for orders placed during the hospital encounter of 08/25/13 (from the past 72 hour(s))  CBC     Status: Abnormal   Collection Time    08/28/13  5:40 AM      Result Value Range   WBC 8.3  4.0 - 10.5 K/uL   RBC 4.16  3.87 - 5.11 MIL/uL   Hemoglobin 9.9 (*) 12.0 - 15.0 g/dL   HCT 40.9 (*) 81.1 - 91.4 %   MCV 73.8 (*) 78.0 - 100.0 fL   MCH 23.8 (*) 26.0 - 34.0 pg   MCHC 32.2  30.0 - 36.0 g/dL   RDW 78.2 (*) 95.6 - 21.3 %   Platelets 132 (*) 150 - 400 K/uL  TYPE AND SCREEN     Status: None   Collection Time    08/28/13  5:40 AM      Result Value Range   ABO/RH(D) A POS     Antibody Screen NEG     Sample Expiration 08/31/2013    COMPREHENSIVE METABOLIC PANEL     Status: Abnormal   Collection Time    08/28/13  5:40 AM      Result Value Range   Sodium 136  135 - 145 mEq/L   Potassium 3.8  3.5 - 5.1 mEq/L   Chloride 106  96 - 112 mEq/L   CO2 21  19 - 32 mEq/L   Glucose, Bld 87  70 - 99 mg/dL   BUN 6  6 - 23 mg/dL   Creatinine, Ser 0.86  0.50 - 1.10 mg/dL   Calcium 8.6  8.4 - 57.8 mg/dL   Total Protein 5.1 (*) 6.0 - 8.3 g/dL   Albumin 2.4 (*) 3.5 - 5.2 g/dL   AST 19  0 - 37 U/L   ALT 17  0 - 35 U/L   Alkaline Phosphatase 106  39 - 117 U/L   Total Bilirubin 0.4  0.3 - 1.2 mg/dL   GFR calc non Af Amer >90  >90 mL/min   GFR calc Af Amer >90  >90 mL/min   Comment: (NOTE)     The eGFR has been calculated using the CKD EPI equation.     This calculation has not been validated in all clinical situations.     eGFR's persistently <90 mL/min signify possible  Chronic Kidney     Disease.  CBC     Status: Abnormal   Collection Time    08/29/13 10:00 AM      Result Value Range   WBC 12.1 (*) 4.0 - 10.5 K/uL   RBC 4.17  3.87 - 5.11 MIL/uL   Hemoglobin 10.1 (*) 12.0 - 15.0 g/dL   HCT 46.9 (*) 62.9 - 52.8 %   MCV 72.4 (*) 78.0 - 100.0 fL   MCH 24.2 (*) 26.0 - 34.0 pg   MCHC 33.4  30.0 - 36.0 g/dL   RDW 41.3 (*) 24.4 - 01.0 %   Platelets 124 (*)  150 - 400 K/uL   Comment: SPECIMEN CHECKED FOR CLOTS     REPEATED TO VERIFY  DIFFERENTIAL     Status: Abnormal   Collection Time    08/29/13  3:15 PM      Result Value Range   Neutrophils Relative % 76  43 - 77 %   Neutro Abs 9.1 (*) 1.7 - 7.7 K/uL   Lymphocytes Relative 13  12 - 46 %   Lymphs Abs 1.6  0.7 - 4.0 K/uL   Monocytes Relative 10  3 - 12 %   Monocytes Absolute 1.3 (*) 0.1 - 1.0 K/uL   Eosinophils Relative 1  0 - 5 %   Eosinophils Absolute 0.1  0.0 - 0.7 K/uL   Basophils Relative 0  0 - 1 %   Basophils Absolute 0.0  0.0 - 0.1 K/uL  CBC WITH DIFFERENTIAL     Status: Abnormal   Collection Time    08/30/13  8:20 AM      Result Value Range   WBC 12.4 (*) 4.0 - 10.5 K/uL   RBC 4.42  3.87 - 5.11 MIL/uL   Hemoglobin 10.5 (*) 12.0 - 15.0 g/dL   HCT 16.1 (*) 09.6 - 04.5 %   MCV 72.2 (*) 78.0 - 100.0 fL   MCH 23.8 (*) 26.0 - 34.0 pg   MCHC 32.9  30.0 - 36.0 g/dL   RDW 40.9 (*) 81.1 - 91.4 %   Platelets 133 (*) 150 - 400 K/uL   Neutrophils Relative % 74  43 - 77 %   Neutro Abs 9.1 (*) 1.7 - 7.7 K/uL   Lymphocytes Relative 14  12 - 46 %   Lymphs Abs 1.7  0.7 - 4.0 K/uL   Monocytes Relative 11  3 - 12 %   Monocytes Absolute 1.4 (*) 0.1 - 1.0 K/uL   Eosinophils Relative 1  0 - 5 %   Eosinophils Absolute 0.1  0.0 - 0.7 K/uL   Basophils Relative 0  0 - 1 %   Basophils Absolute 0.0  0.0 - 0.1 K/uL    Assessment and Plan [redacted]w[redacted]d  PPROM CHECK Sensitivites on GBS and continue clindamycin Monitor temperature.  Last night temp of 100.5 was axillary.  All oral temps below 100.4 Will monitor  for s/s of chorio Pt doing well

## 2013-08-30 NOTE — Progress Notes (Signed)
Patient ID: Tiffany Sharp, female   DOB: Aug 30, 1971, 42 y.o.   MRN: [id number]  Tiffany Sharp is a 42 y.o. [id number] at [redacted]w[redacted]d admitted for rupture of membranes  Subjective: Pt has not complaints but has several questions about her temp being 99 today.  She denies any sxs.  Reports fluid continues to be pinkish.  She denies ctxs and reports good fetal movement.   Objective: BP 119/98  Pulse 106  Temp(Src) 98.9 F (37.2 C) (Oral)  Resp 18  Ht 5\' 10"  (1.778 m)  Wt 105.688 kg (233 lb)  BMI 33.43 kg/m2  SpO2 98%  LMP 01/14/2013      Physical Exam:  Gen: alert Chest/Lungs: cta bilaterally  Heart/Pulse: RRR  Abdomen: soft, gravid, nontender Uterine fundus: soft, nontender Skin & Color: warm and dry  Neurological: AOx3, DTRs wnl EXT: negative Homan's b/l  FHT:  FHR: 140s-150s bpm, variability: moderate,  accelerations:  Present,  decelerations:  Absent UC:   none SVE:   Dilation: Closed Effacement (%): 60 Station: -2;Ballotable Exam by:: Oxley, CNM  Labs: Lab Results  Component Value Date   WBC 12.1* 08/29/2013   HGB 10.1* 08/29/2013   HCT 30.2* 08/29/2013   MCV 72.4* 08/29/2013   PLT 124* 08/29/2013    Assessment and Plan: has Breast asymmetry in female; HSV (herpes simplex virus) infection; H/O HELLP syndrome, currently pregnant; H/O PPH (postpartum hemorrhage); Anemia; Breast lump; Allergy to penicillin; Allergy to sulfonamides; Rubella non-immune status, antepartum; History of postpartum depression, currently pregnant; History of eclampsia; Advanced maternal age (AMA), 40 years or greater; Obesity, unspecified; History of toxic shock syndrome; History of loop electrical excision procedure (LEEP); H/O LGA (large for gestational age) fetus; Preterm premature rupture of membranes in third trimester; GBS (group B Streptococcus carrier), +RV culture, currently pregnant; and Thrombocytopenia, unspecified on her problem list.  PPROM at 42 5/7wks currently stable.  Discussed  delivery at 34wks and sooner if s/sxs of chorio (temp 100.4 or greater).  Fetal status is reassuring.  Cont to observe.  Pt continuing course of abx.  Tiffany Sharp Y 08/30/2013, 7:35 AM

## 2013-08-30 NOTE — Progress Notes (Signed)
Antenatal Nutrition Assessment:  Currently  32 6/[redacted] weeks gestation, with PROM. Height  70" Weight 230 lbs pre-pregnancy weight 223 lbs at 11 weeks.Pre-pregnancy  BMI 32.1  IBW 150 lbs  Total weight gain 7 lbs. Weight gain goals 11-20 lbs.   Estimated needs: 23-2500 kcal/day, 84-94 grams protein/day, 2.6 liters fluid/day  Antenatal regular diet tolerated well, appetite good. Current diet prescription will provide for increased needs. On PNV and FeSO4 No abnormal nutrition related labs  Hemoglobin & Hematocrit     Component Value Date/Time   HGB 10.5* 08/30/2013 0820   HGB 12.2 02/28/2013   HCT 31.9* 08/30/2013 0820   HCT 38 02/28/2013     Nutrition Dx: Increased nutrient needs r/t pregnancy and fetal growth requirements aeb [redacted] weeks gestation.  No educational needs assessed at this time.  Elisabeth Cara M.Odis Luster LDN Neonatal Nutrition Support Specialist Pager [phone]

## 2013-08-31 MED ORDER — HYDROCODONE-ACETAMINOPHEN 5-325 MG PO TABS
1.0000 | ORAL_TABLET | Freq: Four times a day (QID) | ORAL | Status: DC | PRN
  Administered 2013-08-31: 2 via ORAL
  Filled 2013-08-31: qty 2

## 2013-08-31 MED ORDER — POLYETHYLENE GLYCOL 3350 17 G PO PACK
17.0000 g | PACK | Freq: Every day | ORAL | Status: DC | PRN
  Filled 2013-08-31: qty 1

## 2013-08-31 MED ORDER — HYDROCORTISONE 2.5 % RE CREA
TOPICAL_CREAM | Freq: Two times a day (BID) | RECTAL | Status: DC
  Administered 2013-08-31: 1 via RECTAL
  Administered 2013-08-31: 10:00:00 via RECTAL
  Filled 2013-08-31: qty 28.35

## 2013-08-31 NOTE — Progress Notes (Signed)
42 y.o. year old female,at [redacted]w[redacted]d gestation.  SUBJECTIVE:  The patient reports that she is doing okay. She's had more cramping today than before. No vaginal bleeding.  OBJECTIVE:  BP 117/61  Pulse 107  Temp(Src) 99.3 F (37.4 C) (Oral)  Resp 18  Ht 5\' 10"  (1.778 m)  Wt 230 lb 6.4 oz (104.509 kg)  BMI 33.06 kg/m2  SpO2 98%  LMP 01/14/2013  Fetal Heart Tones:  Category 1  Contractions:          Mild  Abdomen: Soft and nontender  ASSESSMENT:  [redacted]w[redacted]d Weeks Pregnancy  Preterm rupture membranes  Premature rupture of membranes  More uterine contractions today  PLAN:  Continue observation. The patient understands that if she labors we will allow a vaginal delivery.  Leonard Schwartz, M.D.

## 2013-08-31 NOTE — Progress Notes (Signed)
Pt was in good spirits today.  She reported that the first several days were difficult but that she and her family are adjusting.  Her husband is a Education officer, environmental and their community is supporting the family through food and help with the children.    She is aware of on-going availability of spiritual care, but please also page as needs arise.    [address] [place] Pager, [phone] 11:22 AM   08/31/13 1100  Clinical Encounter Type  Visited With Patient  Visit Type Initial;Spiritual support  Referral From Nurse  Spiritual Encounters  Spiritual Needs Emotional

## 2013-08-31 NOTE — Progress Notes (Signed)
42 y.o. year old female,at [redacted]w[redacted]d gestation.  SUBJECTIVE:  Doing well. Continues to leak fluid. No vaginal bleeding.  OBJECTIVE:  BP 116/57  Pulse 93  Temp(Src) 98.8 F (37.1 C) (Oral)  Resp 18  Ht 5\' 10"  (1.778 m)  Wt 230 lb 6.4 oz (104.509 kg)  BMI 33.06 kg/m2  SpO2 98%  LMP 01/14/2013  Fetal Heart Tones:  Category 1  Contractions:          Very few  Chest: Clear Heart: Regular rate and rhythm Abdomen: Soft and nontender Extremities: Nontender, no masses  ASSESSMENT:  [redacted]w[redacted]d Weeks Pregnancy  Premature and preterm rupture of membranes  PLAN:  Sensitivities for beta strep are pending. Continue current management.  Leonard Schwartz, M.D.

## 2013-08-31 NOTE — Progress Notes (Signed)
  Subjective: Pt was given an Ambien at 2000.  Now she is c/o not being able to sleep with UCs and requests pain medication.  Pt reports UCs have not changed from the UCs she's been experiencing through out the day.  Objective: BP 137/82  Pulse 108  Temp(Src) 100.2 F (37.9 C) (Oral)  Resp 18  Ht 5\' 10"  (1.778 m)  Wt 230 lb 6.4 oz (104.509 kg)  BMI 33.06 kg/m2  SpO2 98%  LMP 01/14/2013     Assessment / Plan: [redacted]w[redacted]d   PPROM  UCs Difficulty sleeping Rising Temp  C/w Dr. Stefano Gaul Vicodin for moderate pain CTO temp   Cung Masterson 08/31/2013, 10:22 PM

## 2013-08-31 NOTE — Progress Notes (Signed)
ANTICOAGULATION CONSULT NOTE - Follow Up Consult  Pharmacy Consult for Lovenox Indication: VTE prophylaxis  Allergies  Allergen Reactions  . Penicillins Hives  . Sulfa Antibiotics Hives    Patient Measurements: Height: 5\' 10"  (177.8 cm) Weight: 230 lb 6.4 oz (104.509 kg) IBW/kg (Calculated) : 68.5  Vital Signs: Temp: 98.9 F (37.2 C) (10/30 1246) Temp src: Oral (10/30 1246) BP: 131/66 mmHg (10/30 1246) Pulse Rate: 97 (10/30 1246)  Labs:  Recent Labs  08/29/13 1000 08/30/13 0820  HGB 10.1* 10.5*  HCT 30.2* 31.9*  PLT 124* 133*    Estimated Creatinine Clearance: 118.7 ml/min (by C-G formula based on Cr of 0.56).   Medications:  Lovenox 50 mg SQ q24h  Assessment: Pt is [redacted]w[redacted]d on Lovenox for VTE prophylaxis due to limited mobility. H/H and plt are stable. Renal function remains good. Pharmacy will sign off and continue to follow peripherally. Please call pharmacy if further questions arise.   Plan:  Continue Lovenox 50 mg SQ q24h  Rocquel Askren [place] 08/31/2013,1:55 PM

## 2013-09-01 ENCOUNTER — Inpatient Hospital Stay (HOSPITAL_COMMUNITY): Payer: BC Managed Care – PPO | Admitting: Anesthesiology

## 2013-09-01 ENCOUNTER — Inpatient Hospital Stay (HOSPITAL_COMMUNITY): Payer: BC Managed Care – PPO | Admitting: Registered Nurse

## 2013-09-01 ENCOUNTER — Encounter (HOSPITAL_COMMUNITY): Payer: BC Managed Care – PPO | Admitting: Registered Nurse

## 2013-09-01 ENCOUNTER — Encounter (HOSPITAL_COMMUNITY)
Admission: AD | Disposition: A | Discharge status: Home / Self Care | Payer: Self-pay | Source: Ambulatory Visit | Attending: Obstetrics and Gynecology

## 2013-09-01 ENCOUNTER — Encounter (HOSPITAL_COMMUNITY): Payer: Self-pay | Admitting: *Deleted

## 2013-09-01 ENCOUNTER — Ambulatory Visit: Admit: 2013-09-01 | Payer: Self-pay | Admitting: Obstetrics and Gynecology

## 2013-09-01 HISTORY — PX: DILATION AND EVACUATION: SHX1459

## 2013-09-01 LAB — RPR: RPR Ser Ql: NONREACTIVE

## 2013-09-01 LAB — CBC
HCT: 33.5 % — ABNORMAL LOW (ref 36.0–46.0)
Hemoglobin: 9.7 g/dL — ABNORMAL LOW (ref 12.0–15.0)
MCH: 23.8 pg — ABNORMAL LOW (ref 26.0–34.0)
MCHC: 33.1 g/dL (ref 30.0–36.0)
MCHC: 33.1 g/dL (ref 30.0–36.0)
MCHC: 33.7 g/dL (ref 30.0–36.0)
MCV: 71.7 fL — ABNORMAL LOW (ref 78.0–100.0)
MCV: 71.9 fL — ABNORMAL LOW (ref 78.0–100.0)
Platelets: 136 10*3/uL — ABNORMAL LOW (ref 150–400)
Platelets: 119 10*3/uL — ABNORMAL LOW (ref 150–400)
Platelets: 137 10*3/uL — ABNORMAL LOW (ref 150–400)
RBC: 4.03 MIL/uL (ref 3.87–5.11)
RDW: 15.8 % — ABNORMAL HIGH (ref 11.5–15.5)
RDW: 15.6 % — ABNORMAL HIGH (ref 11.5–15.5)
WBC: 14.7 10*3/uL — ABNORMAL HIGH (ref 4.0–10.5)
WBC: 15.2 10*3/uL — ABNORMAL HIGH (ref 4.0–10.5)

## 2013-09-01 LAB — PREPARE RBC (CROSSMATCH)

## 2013-09-01 SURGERY — DILATION AND EVACUATION, UTERUS
Anesthesia: Epidural | Wound class: Clean Contaminated

## 2013-09-01 MED ORDER — LIDOCAINE HCL (CARDIAC) 20 MG/ML IV SOLN
INTRAVENOUS | Status: AC
  Filled 2013-09-01: qty 5

## 2013-09-01 MED ORDER — EPHEDRINE 5 MG/ML INJ
10.0000 mg | INTRAVENOUS | Status: DC | PRN
  Filled 2013-09-01: qty 4

## 2013-09-01 MED ORDER — IBUPROFEN 600 MG PO TABS
600.0000 mg | ORAL_TABLET | Freq: Four times a day (QID) | ORAL | Status: DC
  Administered 2013-09-01 – 2013-09-03 (×8): 600 mg via ORAL
  Filled 2013-09-01 (×8): qty 1

## 2013-09-01 MED ORDER — ONDANSETRON HCL 4 MG/2ML IJ SOLN
INTRAMUSCULAR | Status: AC
  Filled 2013-09-01: qty 2

## 2013-09-01 MED ORDER — MEPERIDINE HCL 25 MG/ML IJ SOLN: 6.2500 mg | INTRAMUSCULAR | Status: DC | PRN

## 2013-09-01 MED ORDER — ACETAMINOPHEN 325 MG PO TABS
650.0000 mg | ORAL_TABLET | ORAL | Status: DC | PRN
  Administered 2013-09-01: 650 mg via ORAL
  Filled 2013-09-01: qty 2

## 2013-09-01 MED ORDER — IBUPROFEN 600 MG PO TABS: 600.0000 mg | ORAL_TABLET | Freq: Four times a day (QID) | ORAL | Status: DC | PRN

## 2013-09-01 MED ORDER — LACTATED RINGERS IV SOLN
INTRAVENOUS | Status: DC
  Administered 2013-09-01 (×4): via INTRAVENOUS

## 2013-09-01 MED ORDER — OXYTOCIN 40 UNITS IN LACTATED RINGERS INFUSION - SIMPLE MED
62.5000 mL/h | INTRAVENOUS | Status: DC
  Filled 2013-09-01: qty 1000

## 2013-09-01 MED ORDER — ZOLPIDEM TARTRATE 5 MG PO TABS: 5.0000 mg | ORAL_TABLET | Freq: Every evening | ORAL | Status: DC | PRN

## 2013-09-01 MED ORDER — PHENYLEPHRINE 40 MCG/ML (10ML) SYRINGE FOR IV PUSH (FOR BLOOD PRESSURE SUPPORT): 80.0000 ug | PREFILLED_SYRINGE | INTRAVENOUS | Status: DC | PRN

## 2013-09-01 MED ORDER — EPHEDRINE 5 MG/ML INJ: 10.0000 mg | INTRAVENOUS | Status: DC | PRN

## 2013-09-01 MED ORDER — ONDANSETRON HCL 4 MG/2ML IJ SOLN: 4.0000 mg | INTRAMUSCULAR | Status: DC | PRN

## 2013-09-01 MED ORDER — HYDROMORPHONE HCL PF 1 MG/ML IJ SOLN: 0.2500 mg | INTRAMUSCULAR | Status: DC | PRN

## 2013-09-01 MED ORDER — GENTAMICIN SULFATE 40 MG/ML IJ SOLN
Freq: Three times a day (TID) | INTRAVENOUS | Status: DC
  Administered 2013-09-01: 100 mL via INTRAVENOUS
  Filled 2013-09-01 (×2): qty 5.25

## 2013-09-01 MED ORDER — WITCH HAZEL-GLYCERIN EX PADS: 1.0000 "application " | MEDICATED_PAD | CUTANEOUS | Status: DC | PRN

## 2013-09-01 MED ORDER — DEXAMETHASONE SODIUM PHOSPHATE 10 MG/ML IJ SOLN
INTRAMUSCULAR | Status: AC
  Filled 2013-09-01: qty 1

## 2013-09-01 MED ORDER — CLINDAMYCIN PHOSPHATE 900 MG/50ML IV SOLN
900.0000 mg | Freq: Three times a day (TID) | INTRAVENOUS | Status: DC
  Administered 2013-09-01: 900 mg via INTRAVENOUS
  Filled 2013-09-01 (×2): qty 50

## 2013-09-01 MED ORDER — LACTATED RINGERS IV SOLN: 500.0000 mL | INTRAVENOUS | Status: DC | PRN

## 2013-09-01 MED ORDER — LACTATED RINGERS IV SOLN: INTRAVENOUS | Status: DC

## 2013-09-01 MED ORDER — MISOPROSTOL 200 MCG PO TABS
ORAL_TABLET | ORAL | Status: AC
  Filled 2013-09-01: qty 4

## 2013-09-01 MED ORDER — PROPOFOL 10 MG/ML IV EMUL
INTRAVENOUS | Status: AC
  Filled 2013-09-01: qty 20

## 2013-09-01 MED ORDER — OXYTOCIN BOLUS FROM INFUSION
500.0000 mL | INTRAVENOUS | Status: DC
  Administered 2013-09-01: 500 mL via INTRAVENOUS

## 2013-09-01 MED ORDER — LACTATED RINGERS IV SOLN
500.0000 mL | Freq: Once | INTRAVENOUS | Status: AC
  Administered 2013-09-01: 500 mL via INTRAVENOUS

## 2013-09-01 MED ORDER — TETANUS-DIPHTH-ACELL PERTUSSIS 5-2.5-18.5 LF-MCG/0.5 IM SUSP
0.5000 mL | Freq: Once | INTRAMUSCULAR | Status: AC
  Administered 2013-09-02: 0.5 mL via INTRAMUSCULAR
  Filled 2013-09-01: qty 0.5

## 2013-09-01 MED ORDER — DIPHENHYDRAMINE HCL 50 MG/ML IJ SOLN
12.5000 mg | INTRAMUSCULAR | Status: DC | PRN
  Administered 2013-09-01: 12.5 mg via INTRAVENOUS
  Filled 2013-09-01: qty 1

## 2013-09-01 MED ORDER — MIDAZOLAM HCL 5 MG/5ML IJ SOLN
INTRAMUSCULAR | Status: DC | PRN
  Administered 2013-09-01: 2 mg via INTRAVENOUS

## 2013-09-01 MED ORDER — SENNOSIDES-DOCUSATE SODIUM 8.6-50 MG PO TABS
2.0000 | ORAL_TABLET | ORAL | Status: DC
  Administered 2013-09-02 (×2): 2 via ORAL
  Filled 2013-09-01 (×2): qty 2

## 2013-09-01 MED ORDER — PHENYLEPHRINE 40 MCG/ML (10ML) SYRINGE FOR IV PUSH (FOR BLOOD PRESSURE SUPPORT)
80.0000 ug | PREFILLED_SYRINGE | INTRAVENOUS | Status: DC | PRN
  Filled 2013-09-01: qty 10

## 2013-09-01 MED ORDER — GENTAMICIN SULFATE 40 MG/ML IJ SOLN
Freq: Three times a day (TID) | INTRAVENOUS | Status: AC
  Administered 2013-09-02 (×3): via INTRAVENOUS
  Filled 2013-09-01 (×3): qty 5.25

## 2013-09-01 MED ORDER — ONDANSETRON HCL 4 MG PO TABS: 4.0000 mg | ORAL_TABLET | ORAL | Status: DC | PRN

## 2013-09-01 MED ORDER — LANOLIN HYDROUS EX OINT: TOPICAL_OINTMENT | CUTANEOUS | Status: DC | PRN

## 2013-09-01 MED ORDER — LIDOCAINE HCL (PF) 1 % IJ SOLN
30.0000 mL | INTRAMUSCULAR | Status: DC | PRN
  Filled 2013-09-01: qty 30

## 2013-09-01 MED ORDER — DEXTROSE 5 % IV SOLN
210.0000 mg | Freq: Once | INTRAVENOUS | Status: DC
  Filled 2013-09-01: qty 5.25

## 2013-09-01 MED ORDER — METHYLERGONOVINE MALEATE 0.2 MG/ML IJ SOLN
INTRAMUSCULAR | Status: AC
  Filled 2013-09-01: qty 1

## 2013-09-01 MED ORDER — BUTORPHANOL TARTRATE 1 MG/ML IJ SOLN
1.0000 mg | INTRAMUSCULAR | Status: DC | PRN
  Administered 2013-09-01 (×2): 1 mg via INTRAVENOUS
  Filled 2013-09-01 (×2): qty 1

## 2013-09-01 MED ORDER — LIDOCAINE HCL (PF) 1 % IJ SOLN
INTRAMUSCULAR | Status: DC | PRN
  Administered 2013-09-01 (×2): 5 mL

## 2013-09-01 MED ORDER — MIDAZOLAM HCL 2 MG/2ML IJ SOLN
INTRAMUSCULAR | Status: AC
  Filled 2013-09-01: qty 2

## 2013-09-01 MED ORDER — KETOROLAC TROMETHAMINE 30 MG/ML IJ SOLN: 15.0000 mg | Freq: Once | INTRAMUSCULAR | Status: DC | PRN

## 2013-09-01 MED ORDER — OXYCODONE-ACETAMINOPHEN 5-325 MG PO TABS: 1.0000 | ORAL_TABLET | ORAL | Status: DC | PRN

## 2013-09-01 MED ORDER — DEXAMETHASONE SODIUM PHOSPHATE 10 MG/ML IJ SOLN
INTRAMUSCULAR | Status: DC | PRN
  Administered 2013-09-01: 10 mg via INTRAVENOUS

## 2013-09-01 MED ORDER — DIPHENHYDRAMINE HCL 25 MG PO CAPS: 25.0000 mg | ORAL_CAPSULE | Freq: Four times a day (QID) | ORAL | Status: DC | PRN

## 2013-09-01 MED ORDER — SODIUM BICARBONATE 8.4 % IV SOLN
INTRAVENOUS | Status: AC
  Filled 2013-09-01: qty 50

## 2013-09-01 MED ORDER — PROMETHAZINE HCL 25 MG/ML IJ SOLN: 6.2500 mg | INTRAMUSCULAR | Status: DC | PRN

## 2013-09-01 MED ORDER — PHENYLEPHRINE 40 MCG/ML (10ML) SYRINGE FOR IV PUSH (FOR BLOOD PRESSURE SUPPORT)
PREFILLED_SYRINGE | INTRAVENOUS | Status: AC
  Filled 2013-09-01: qty 5

## 2013-09-01 MED ORDER — SIMETHICONE 80 MG PO CHEW: 80.0000 mg | CHEWABLE_TABLET | ORAL | Status: DC | PRN

## 2013-09-01 MED ORDER — DIBUCAINE 1 % RE OINT: 1.0000 "application " | TOPICAL_OINTMENT | RECTAL | Status: DC | PRN

## 2013-09-01 MED ORDER — CITRIC ACID-SODIUM CITRATE 334-500 MG/5ML PO SOLN
30.0000 mL | ORAL | Status: DC | PRN
  Administered 2013-09-01: 30 mL via ORAL
  Filled 2013-09-01 (×2): qty 15

## 2013-09-01 MED ORDER — CLINDAMYCIN PHOSPHATE 900 MG/50ML IV SOLN
900.0000 mg | Freq: Once | INTRAVENOUS | Status: AC
  Administered 2013-09-01: 900 mg via INTRAVENOUS
  Filled 2013-09-01: qty 50

## 2013-09-01 MED ORDER — FENTANYL CITRATE 0.05 MG/ML IJ SOLN
INTRAMUSCULAR | Status: DC | PRN
  Administered 2013-09-01: 50 ug via INTRAVENOUS

## 2013-09-01 MED ORDER — PRENATAL MULTIVITAMIN CH
1.0000 | ORAL_TABLET | Freq: Every day | ORAL | Status: DC
  Administered 2013-09-02 – 2013-09-03 (×2): 1 via ORAL
  Filled 2013-09-01 (×2): qty 1

## 2013-09-01 MED ORDER — LIDOCAINE HCL 2 % IJ SOLN
INTRAMUSCULAR | Status: AC
  Filled 2013-09-01: qty 20

## 2013-09-01 MED ORDER — ONDANSETRON HCL 4 MG/2ML IJ SOLN: 4.0000 mg | Freq: Four times a day (QID) | INTRAMUSCULAR | Status: DC | PRN

## 2013-09-01 MED ORDER — ONDANSETRON HCL 4 MG/2ML IJ SOLN
INTRAMUSCULAR | Status: DC | PRN
  Administered 2013-09-01: 4 mg via INTRAVENOUS

## 2013-09-01 MED ORDER — FENTANYL 2.5 MCG/ML BUPIVACAINE 1/10 % EPIDURAL INFUSION (WH - ANES)
14.0000 mL/h | INTRAMUSCULAR | Status: DC | PRN
  Administered 2013-09-01: 14 mL/h via EPIDURAL
  Filled 2013-09-01: qty 125

## 2013-09-01 MED ORDER — OXYTOCIN 40 UNITS IN LACTATED RINGERS INFUSION - SIMPLE MED
1.0000 m[IU]/min | INTRAVENOUS | Status: DC
  Administered 2013-09-01: 2 m[IU]/min via INTRAVENOUS

## 2013-09-01 MED ORDER — BENZOCAINE-MENTHOL 20-0.5 % EX AERO
1.0000 "application " | INHALATION_SPRAY | CUTANEOUS | Status: DC | PRN
  Administered 2013-09-02: 1 via TOPICAL
  Filled 2013-09-01: qty 56

## 2013-09-01 MED ORDER — SODIUM BICARBONATE 8.4 % IV SOLN
INTRAVENOUS | Status: DC | PRN
  Administered 2013-09-01 (×3): 5 mL via EPIDURAL

## 2013-09-01 MED ORDER — PHENYLEPHRINE 40 MCG/ML (10ML) SYRINGE FOR IV PUSH (FOR BLOOD PRESSURE SUPPORT)
PREFILLED_SYRINGE | INTRAVENOUS | Status: AC
  Filled 2013-09-01: qty 10

## 2013-09-01 MED ORDER — SODIUM CHLORIDE 0.9 % IV SOLN
INTRAVENOUS | Status: DC | PRN
  Administered 2013-09-01: 18:00:00 via INTRAVENOUS

## 2013-09-01 MED ORDER — PHENYLEPHRINE HCL 10 MG/ML IJ SOLN
INTRAMUSCULAR | Status: DC | PRN
  Administered 2013-09-01 (×5): 80 ug via INTRAVENOUS

## 2013-09-01 MED ORDER — FENTANYL CITRATE 0.05 MG/ML IJ SOLN
INTRAMUSCULAR | Status: AC
  Filled 2013-09-01: qty 2

## 2013-09-01 MED ORDER — LIDOCAINE-EPINEPHRINE (PF) 2 %-1:200000 IJ SOLN
INTRAMUSCULAR | Status: AC
  Filled 2013-09-01: qty 20

## 2013-09-01 SURGICAL SUPPLY — 21 items
CATH ROBINSON RED A/P 16FR (CATHETERS) ×2 IMPLANT
CLOTH BEACON ORANGE TIMEOUT ST (SAFETY) ×2 IMPLANT
DECANTER SPIKE VIAL GLASS SM (MISCELLANEOUS) ×2 IMPLANT
GLOVE BIO SURGEON STRL SZ7.5 (GLOVE) ×2 IMPLANT
GLOVE BIOGEL PI IND STRL 7.5 (GLOVE) ×2 IMPLANT
GLOVE BIOGEL PI INDICATOR 7.5 (GLOVE) ×2
GOWN STRL REIN XL XLG (GOWN DISPOSABLE) ×4 IMPLANT
KIT BERKELEY 1ST TRIMESTER 3/8 (MISCELLANEOUS) ×2 IMPLANT
NDL SPNL 22GX3.5 QUINCKE BK (NEEDLE) ×1 IMPLANT
NEEDLE SPNL 22GX3.5 QUINCKE BK (NEEDLE) IMPLANT
NS IRRIG 1000ML POUR BTL (IV SOLUTION) ×2 IMPLANT
PACK VAGINAL MINOR WOMEN LF (CUSTOM PROCEDURE TRAY) ×2 IMPLANT
PAD OB MATERNITY 4.3X12.25 (PERSONAL CARE ITEMS) ×2 IMPLANT
PAD PREP 24X48 CUFFED NSTRL (MISCELLANEOUS) ×2 IMPLANT
SET BERKELEY SUCTION TUBING (SUCTIONS) ×2 IMPLANT
SYR CONTROL 10ML LL (SYRINGE) ×2 IMPLANT
TOWEL OR 17X24 6PK STRL BLUE (TOWEL DISPOSABLE) ×4 IMPLANT
VACURETTE 10 RIGID CVD (CANNULA) IMPLANT
VACURETTE 7MM CVD STRL WRAP (CANNULA) IMPLANT
VACURETTE 8 RIGID CVD (CANNULA) IMPLANT
VACURETTE 9 RIGID CVD (CANNULA) IMPLANT

## 2013-09-01 NOTE — Anesthesia Preprocedure Evaluation (Signed)
Anesthesia Evaluation  Patient identified by MRN, date of birth, ID band Patient awake    Reviewed: Allergy & Precautions, H&P , Patient's Chart, lab work & pertinent test results  Airway Mallampati: II TM Distance: >3 FB Neck ROM: full    Dental no notable dental hx.    Pulmonary neg pulmonary ROS,  breath sounds clear to auscultation  Pulmonary exam normal       Cardiovascular negative cardio ROS  Rhythm:regular Rate:Normal     Neuro/Psych PSYCHIATRIC DISORDERS Depression negative neurological ROS  negative psych ROS   GI/Hepatic negative GI ROS, Neg liver ROS,   Endo/Other  negative endocrine ROS  Renal/GU negative Renal ROS     Musculoskeletal   Abdominal   Peds  Hematology negative hematology ROS (+)   Anesthesia Other Findings   Reproductive/Obstetrics (+) Pregnancy                           Anesthesia Physical  Anesthesia Plan  ASA: III  Anesthesia Plan: Epidural   Post-op Pain Management:    Induction:   Airway Management Planned:   Additional Equipment:   Intra-op Plan:   Post-operative Plan:   Informed Consent: I have reviewed the patients History and Physical, chart, labs and discussed the procedure including the risks, benefits and alternatives for the proposed anesthesia with the patient or authorized representative who has indicated his/her understanding and acceptance.     Plan Discussed with: CRNA and Surgeon  Anesthesia Plan Comments: (Patient has retained placenta post delivery. Her epidural is in-situ.)       off prophylactic lovenox for 12 hours Anesthesia Quick Evaluation

## 2013-09-01 NOTE — Progress Notes (Signed)
Patient ID: Tiffany Sharp, female   DOB: 1971-05-23, 42 y.o.   MRN: [id number] Tiffany Sharp is a 42 y.o. [id number] at [redacted]w[redacted]d admitted for PPROM  Subjective: Pt was crying w ctx and states 2nd dose of stadol did not help Comfortable now w epidural, feels much more relaxed   Objective: BP 136/75  Pulse 105  Temp(Src) 99 F (37.2 C) (Oral)  Resp 18  Ht 5\' 10"  (1.778 Sharp)  Wt 230 lb 6.4 oz (104.509 kg)  BMI 33.06 kg/m2  SpO2 94%  LMP 01/14/2013     FHT:  Cat 1 UC:   toco irreg,   SVE:   Dilation: 4 Effacement (%): 90 Station: -2 Exam by:: [name], CNM  Increased bloody show noted   Assessment / Plan:  Labor: early labor, PPROM Preeclampsia:  no s/s Fetal Wellbeing:  Category I Pain Control:  Epidural Anticipated MOD:  NSVD  GBS pos, rcv'd clinda   Discussed w Dr Su Hilt, no recommendation for augmentation at this point CTO labor    Tiffany Sharp 09/01/2013, 12:03 PM

## 2013-09-01 NOTE — Progress Notes (Signed)
  Subjective: Called to [facility] for UCs Q 5 min for a couple of hours.  Pt is very uncomfortable and is requesting an epidural.  Pt voices fear and a lot of fatigue, reassurances given.  Objective: BP 126/81  Pulse 101  Temp(Src) 98.9 F (37.2 C) (Oral)  Resp 18  Ht 5\' 10"  (1.778 m)  Wt 230 lb 6.4 oz (104.509 kg)  BMI 33.06 kg/m2  SpO2 98%  LMP 01/14/2013      FHT:  Cat I UC:   regular, every 5 minutes  SVE:   Dilation: 3.5 Effacement (%): 70 Station: -2 Exam by:: AL Rinehart RN  Assessment / Plan:  C/w Dr. Stefano Gaul - transfer to L&D; may get epidural; Stadol if no epidural  Labor: Active Preterm Labor  Preeclampsia: No s/s Fetal Wellbeing: Cat I Pain Control: Requesting epidural, may get as soon as she is transferred to L&D I/D: PPROM; ROM since 08/25/13; GBS pos - sensitivities still pending; Occasional 100.2 temp (last time was 2200); Afebrile now Anticipated MOD: SVD   Renaldo Gornick 09/01/2013, 4:40 AM

## 2013-09-01 NOTE — Progress Notes (Signed)
Patient ID: Tiffany Sharp, female   DOB: [birth year], 43 y.o.   MRN: [id number] Tiffany Sharp is a 42 y.o. [id number] at [redacted]w[redacted]d admitted for PPROM   Subjective: C/o feeling ctx on L side, denies pressure, having some chills   Objective: BP 115/60  Pulse 117  Temp(Src) 100.5 F (38.1 C) (Axillary)  Resp 20  Ht 5\' 10"  (1.778 m)  Wt 230 lb 6.4 oz (104.509 kg)  BMI 33.06 kg/m2  SpO2 94%  LMP 01/14/2013     FHT:  Cat 1 UC:   toco q5   SVE:   Dilation: 5 Effacement (%): 100 Station: -2 Exam by:: Greg Eckrich, CNM    Assessment / Plan:  Labor: minimal cervical change since epidural, ctx spaced out Preeclampsia:  no s/s Fetal Wellbeing:  Category I Pain Control:  Epidural Anticipated MOD:  NSVD  GBS pos, rcv'd clindamycin Now w chorio, will add gentamycin  Augment w pitocin per protocol  D/w Dr Imagene Gurney 09/01/2013, 2:58 PM

## 2013-09-01 NOTE — Progress Notes (Signed)
Pt is sleeping after Stadol.  Stable FHT. Will not stop labor. NICU notified.  Dr. Stefano Gaul

## 2013-09-01 NOTE — Progress Notes (Signed)
Patient ID: MAKESHIA SEAT, female   DOB: May 28, 1971, 42 y.o.   MRN: [id number] IRIE DOWSON is a 42 y.o. [id number] at [redacted]w[redacted]d admitted for PPROM, now in labor   Subjective: Coping well, rcv'd good relief w stadol, hoping to get epidural, (anesthesia won't place until 10am due to receiving lovenox)   Objective: BP 111/59  Pulse 107  Temp(Src) 98.7 F (37.1 C) (Oral)  Resp 20  Ht 5\' 10"  (1.778 m)  Wt 230 lb 6.4 oz (104.509 kg)  BMI 33.06 kg/m2  SpO2 98%  LMP 01/14/2013     FHT:  Cat 1 UC:   toco difficulty tracing, palpate q4-5   SVE:   Dilation: 3.5 Effacement (%): 70 Station: -2 Exam by:: Leeann Must, CNM    Assessment / Plan:  Labor: early/active labor Preeclampsia:  no s/s Fetal Wellbeing:  Category I Pain Control:  stadol  Anticipated MOD:  NSVD  GBS pos, on abx    Update physician PRN   Tyonna Talerico M 09/01/2013, 8:41 AM

## 2013-09-01 NOTE — Preoperative (Signed)
Beta Blockers   Reason not to administer Beta Blockers:Not Applicable 

## 2013-09-01 NOTE — Transfer of Care (Signed)
Immediate Anesthesia Transfer of Care Note  Patient: Tiffany Sharp  Procedure(s) Performed: Procedure(s): DILATATION AND EVACUATION (N/A)  Patient Location: PACU  Anesthesia Type:Epidural  Level of Consciousness: awake, alert  and oriented  Airway & Oxygen Therapy: Patient Spontanous Breathing  Post-op Assessment: Report given to PACU RN  Post vital signs: Reviewed  Complications: No apparent anesthesia complications

## 2013-09-01 NOTE — Anesthesia Procedure Notes (Signed)
Epidural Patient location during procedure: OB Start time: 09/01/2013 10:11 AM  Staffing Anesthesiologist: Brayton Caves Performed by: anesthesiologist   Preanesthetic Checklist Completed: patient identified, site marked, surgical consent, pre-op evaluation, timeout performed, IV checked, risks and benefits discussed and monitors and equipment checked  Epidural Patient position: sitting Prep: site prepped and draped and DuraPrep Patient monitoring: continuous pulse ox and blood pressure Approach: midline Injection technique: LOR air and LOR saline  Needle:  Needle type: Tuohy  Needle gauge: 17 G Needle length: 9 cm and 9 Needle insertion depth: 7 cm Catheter type: closed end flexible Catheter size: 19 Gauge Catheter at skin depth: 12 cm Test dose: negative  Assessment Events: blood not aspirated, injection not painful, no injection resistance, negative IV test and no paresthesia  Additional Notes Patient identified.  Risk benefits discussed including failed block, incomplete pain control, headache, nerve damage, paralysis, blood pressure changes, nausea, vomiting, reactions to medication both toxic or allergic, and postpartum back pain.  Patient expressed understanding and wished to proceed.  All questions were answered.  Sterile technique used throughout procedure and epidural site dressed with sterile barrier dressing. No paresthesia or other complications noted.The patient did not experience any signs of intravascular injection such as tinnitus or metallic taste in mouth nor signs of intrathecal spread such as rapid motor block. Please see nursing notes for vital signs.

## 2013-09-01 NOTE — Anesthesia Preprocedure Evaluation (Signed)
Anesthesia Evaluation  Patient identified by MRN, date of birth, ID band Patient awake    Reviewed: Allergy & Precautions, H&P , Patient's Chart, lab work & pertinent test results  Airway Mallampati: II TM Distance: >3 FB Neck ROM: full    Dental no notable dental hx.    Pulmonary neg pulmonary ROS,  breath sounds clear to auscultation  Pulmonary exam normal       Cardiovascular negative cardio ROS  Rhythm:regular Rate:Normal     Neuro/Psych PSYCHIATRIC DISORDERS Depression negative neurological ROS  negative psych ROS   GI/Hepatic negative GI ROS, Neg liver ROS,   Endo/Other  negative endocrine ROS  Renal/GU negative Renal ROS     Musculoskeletal   Abdominal   Peds  Hematology negative hematology ROS (+) anemia ,   Anesthesia Other Findings   Reproductive/Obstetrics (+) Pregnancy                           Anesthesia Physical Anesthesia Plan  ASA: III  Anesthesia Plan: Epidural   Post-op Pain Management:    Induction:   Airway Management Planned:   Additional Equipment:   Intra-op Plan:   Post-operative Plan:   Informed Consent: I have reviewed the patients History and Physical, chart, labs and discussed the procedure including the risks, benefits and alternatives for the proposed anesthesia with the patient or authorized representative who has indicated his/her understanding and acceptance.     Plan Discussed with:   Anesthesia Plan Comments:        off prophylactic lovenox for 12 hours Anesthesia Quick Evaluation

## 2013-09-01 NOTE — Anesthesia Postprocedure Evaluation (Signed)
Anesthesia Post Note  Patient: Tiffany Sharp  Procedure(s) Performed: Procedure(s) (LRB): DILATATION AND EVACUATION (N/A)  Anesthesia type: Epidural  Patient location: PACU  Post pain: Pain level controlled  Post assessment: Post-op Vital signs reviewed  Last Vitals:  Filed Vitals:   09/01/13 1806  BP:   Pulse:   Temp: 37.3 C  Resp:     Post vital signs: Reviewed  Level of consciousness: awake  Complications: No apparent anesthesia complications. Awaiting results of CBC.

## 2013-09-01 NOTE — Progress Notes (Signed)
  Subjective: Pt is very uncomfortable now, FOB at pt side supportive.  Objective: BP 126/81  Pulse 101  Temp(Src) 98.9 F (37.2 C) (Oral)  Resp 18  Ht 5\' 10"  (1.778 m)  Wt 230 lb 6.4 oz (104.509 kg)  BMI 33.06 kg/m2  SpO2 98%  LMP 01/14/2013     Pelvic exam: normal external genitalia, vulva, vagina, cervix, uterus and adnexa. No lesions noted on labia, perineum, vulva, vagina, or cervix.  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

## 2013-09-01 NOTE — Progress Notes (Signed)
ANTIBIOTIC CONSULT NOTE - INITIAL  Pharmacy Consult for Gentamicin Indication: Chorioamnionitis   Allergies  Allergen Reactions  . Penicillins Hives  . Sulfa Antibiotics Hives    Patient Measurements: Height: 5\' 10"  (177.8 cm) Weight: 230 lb 6.4 oz (104.509 kg) IBW/kg (Calculated) : 68.5 Adjusted Body Weight: 79.3kg  Vital Signs: Temp: 100.5 F (38.1 C) (10/31 1407) Temp src: Axillary (10/31 1407) BP: 122/49 mmHg (10/31 1501) Pulse Rate: 118 (10/31 1501)  Labs:  Scr = 0.56mg /dl on 16/10/96  Recent Labs  08/30/13 0820 09/01/13 0535  WBC 12.4* 14.7*  HGB 10.5* 11.1*  PLT 133* 136*   No results found for this basename: GENTTROUGH, GENTPEAK, GENTRANDOM,  in the last 72 hours   Microbiology:   Medications:  Clindamycin 900mg  IV q8 hours Lovenox 50mg  SQ Q24 hours   Assessment: 42 y.o. female [id number] at [redacted]w[redacted]d  Estimated Ke = 0.414, est Vd = 0.38L/Kg  Goal of Therapy:  Gentamicin peak 6-8 mg/L and Trough < 1 mg/L  Plan:  Gentamicin 210 mg IV every 8 hrs  Will check gentamicin levels if continued > 72hr or clinically indicated.  Tiffany Sharp N 09/01/2013,3:04 PM

## 2013-09-01 NOTE — Op Note (Signed)
Preop Diagnosis: retained placenta   Postop Diagnosis: retained placenta   Procedure: 1.Manual Removal of Placenta 2.Curettage   Anesthesia: Choice   Anesthesiologist: Leilani Able, MD   Attending: Kirkland Hun, MD   Assistant: N/a  Findings: Retained placenta to fundus (?partial accreta) Pathology: Placenta  Fluids: 2000 cc  UOP: 100 cc  EBL: 100 cc (+ the EBL from L&D see SL's delivery note)  Complications: None  Procedure: Placenta was manually removed with difficulty.  The fundus felt slight irregular after removing placenta and curettage was performed with minimal tissue returning.  Placenta was sent to path.  Sponge, lap, needle count was correct and patient was taken to the recovery room in good condition.

## 2013-09-01 NOTE — Addendum Note (Signed)
Addendum created 09/01/13 1935 by Jhonnie Garner, CRNA   Modules edited: Anesthesia Medication Administration

## 2013-09-02 ENCOUNTER — Encounter (HOSPITAL_COMMUNITY)
Admit: 2013-09-02 | Discharge: 2013-09-02 | Disposition: A | Discharge status: Home / Self Care | Payer: BC Managed Care – PPO | Attending: Obstetrics and Gynecology | Admitting: Obstetrics and Gynecology

## 2013-09-02 DIAGNOSIS — O923 Agalactia: Secondary | ICD-10-CM | POA: Insufficient documentation

## 2013-09-02 LAB — CBC
MCV: 72.1 fL — ABNORMAL LOW (ref 78.0–100.0)
Platelets: 122 10*3/uL — ABNORMAL LOW (ref 150–400)
RBC: 3.77 MIL/uL — ABNORMAL LOW (ref 3.87–5.11)
WBC: 21.7 10*3/uL — ABNORMAL HIGH (ref 4.0–10.5)

## 2013-09-02 LAB — CULTURE, BETA STREP (GROUP B ONLY)

## 2013-09-02 MED ORDER — IBUPROFEN 600 MG PO TABS: 600.0000 mg | ORAL_TABLET | Freq: Four times a day (QID) | ORAL | Status: DC

## 2013-09-02 NOTE — Progress Notes (Signed)
Clinical Social Work Department PSYCHOSOCIAL ASSESSMENT - MATERNAL/CHILD 09/02/2013  Patient:  Tiffany Sharp, Tiffany Sharp  Account Number:  [id number]  Admit Date:  08/25/2013  Marjo Bicker Name:   Allyson Sabal    Clinical Social Worker:  Heavenlee Maiorana, LCSW   Date/Time:  09/02/2013 12:30 PM  Date Referred:  09/01/2013   Referral source  NICU     Referred reason  NICU   Other referral source:    I:  FAMILY / HOME ENVIRONMENT Child's legal guardian:  PARENT  Guardian - Name Guardian - Age Guardian - Address  [place], [place] [address]  [place], [place] [address]  Isaiah Serge  same as above   Other household support members/support persons Other support:    II  PSYCHOSOCIAL DATA Information Source:  Patient Interview  Event organiser Employment:   Surveyor, quantity resources:  Media planner If [employer] - [place]:    School / Grade:   Maternity Care Coordinator / Child Services Coordination / Early Interventions:  Cultural issues impacting care:  None reported  III  STRENGTHS Strengths  Adequate Resources  Home prepared for Child (including basic supplies)  Supportive family/friends   Strength comment:    IV  RISK FACTORS AND CURRENT PROBLEMS Current Problem:     Risk Factor & Current Problem Patient Issue Family Issue Risk Factor / Current Problem Comment   N N     V  SOCIAL WORK ASSESSMENT Met with mother who was pleasant and receptive to social work intervention.  Parents are married.  They have four other dependents ages 77, 95, 84, and 35.   Both parents are employed.  Father is a Education officer, environmental and mother works from home. Mother seems to be coping well with newborn's NICU admission. Mother reports hx of PP Depression after the birth of her first child.   Informed that she did not seek professional help but was able to deal with it through prayer and her spiritual beliefs and supports.    Informed that the symptoms eventually resolved.  She reports family hx of  depression.  No acute social concerns related at this time. Mother informed of social work Surveyor, mining.      VI SOCIAL WORK PLAN Social Work Plan  Psychosocial Support/Ongoing Assessment of Needs   Izabelle Daus J, LCSW

## 2013-09-02 NOTE — Discharge Summary (Signed)
Vaginal Delivery Discharge Summary  Tiffany Sharp  DOB:    [birth year] MRN:    [id number] CSN:    [id number]  Date of admission:                  08/25/13  Date of discharge:                   09/02/13  Procedures this admission:  Date of Delivery: 09/01/13  Newborn Data:  Live born female  Birth Weight: 5 lb 2.9 oz (2350 g) APGAR: 7, 7   Baby remains in [facility] due to prematurity, doing well. Name: [place]   History of Present Illness:  Tiffany Sharp is a 42 y.o. female, [phone], who presents at [redacted]w[redacted]d weeks gestation. The patient has been followed at the [facility] and Gynecology division of [facility]. She was admitted rupture of membranes at 32 6/7 weeks, with positive GBS noted during her stay. Her pregnancy has been complicated by:  Patient Active Problem List   Diagnosis Date Noted  . Preterm delivery 09/02/2013  . GBS (group B Streptococcus carrier), +RV culture, currently pregnant 08/28/2013  . Thrombocytopenia, unspecified 08/28/2013  . HSV (herpes simplex virus) infection 08/25/2013  . H/O HELLP syndrome, currently pregnant 08/25/2013  . H/O PPH (postpartum hemorrhage) 08/25/2013  . Anemia 08/25/2013  . Breast lump 08/25/2013  . Allergy to penicillin 08/25/2013  . Allergy to sulfonamides 08/25/2013  . Rubella non-immune status, antepartum 08/25/2013  . History of postpartum depression, currently pregnant 08/25/2013  . History of eclampsia 08/25/2013  . Advanced maternal age (AMA), 40 years or greater 08/25/2013  . Obesity, unspecified 08/25/2013  . History of toxic shock syndrome 08/25/2013  . History of loop electrical excision procedure (LEEP) 08/25/2013  . H/O LGA (large for gestational age) fetus 08/25/2013  . Preterm premature rupture of membranes in third trimester 08/25/2013  . Breast asymmetry in female 10/12/2012     Hospital course:  The patient was admitted for PROM at 32 6/7 weeks, with no labor.    She was given betamethasone x 2 doses and placed on ATB for latency.  She had onset of labor and elevated temp early morning of 10/31, then was transferred to [facility] for labor care.  An epidural was placed, and the patient progressed to delivery on the afternoon of 09/02/13.  Baby was transferred to NICU, but was stable.   Her labor was not complicated, but she then was noted to have a retained placenta--D&C was performed by Dr. Su Hilt, with ? Accreta features noted.  Patient tolerated the procedure well, and was taken to [facility] for her postpartum course .She was placed on Amp/Gent x 24 hours after delivery.  She desired to be d/c'd on day 1 after completion of the ATB course--Dr. Su Hilt was consulted and was agreeable with that request. She was discharged to home on postpartum day 1 doing well.  Feeding:  breast  Contraception:  vasectomy  Discharge hemoglobin:  Hemoglobin  Date Value Range Status  09/02/2013 9.2* 12.0 - 15.0 g/dL Final  12/02/8655 84.6   Final     HCT  Date Value Range Status  09/02/2013 27.2* 36.0 - 46.0 % Final  02/28/2013 38   Final  Pre-delivery Hgb on admission 10.8.  Discharge Physical Exam:   General: alert Lochia: appropriate Uterine Fundus: firm Incision: Perineum intact DVT Evaluation: No evidence of DVT seen on physical exam. Negative Homan's sign.  Intrapartum Procedures: spontaneous  vaginal delivery Postpartum Procedures: Postpartum D&C for retained placenta Complications-Operative and Postpartum: none  Discharge Diagnoses: Premature labor, retained placenta  Discharge Information:  Activity:           Per [facility] handout Diet:                routine Medications: Ibuprofen and Iron Condition:      stable Instructions:   Postpartum Care After Vaginal Delivery  After you deliver your newborn (postpartum period), the usual stay in the hospital is 24 72 hours. If there were problems with your labor or delivery, or if you have other  medical problems, you might be in the hospital longer.  While you are in the hospital, you will receive help and instructions on how to care for yourself and your newborn during the postpartum period.  While you are in the hospital:  Be sure to tell your nurses if you have pain or discomfort, as well as where you feel the pain and what makes the pain worse.  If you had an incision made near your vagina (episiotomy) or if you had some tearing during delivery, the nurses may put ice packs on your episiotomy or tear. The ice packs may help to reduce the pain and swelling.  If you are breastfeeding, you may feel uncomfortable contractions of your uterus for a couple of weeks. This is normal. The contractions help your uterus get back to normal size.  It is normal to have some bleeding after delivery.  For the first 1 3 days after delivery, the flow is red and the amount may be similar to a period.  It is common for the flow to start and stop.  In the first few days, you may pass some small clots. Let your nurses know if you begin to pass large clots or your flow increases.  Do not  flush blood clots down the toilet before having the nurse look at them.  During the next 3 10 days after delivery, your flow should become more watery and pink or brown-tinged in color.  Ten to fourteen days after delivery, your flow should be a small amount of yellowish-white discharge.  The amount of your flow will decrease over the first few weeks after delivery. Your flow may stop in 6 8 weeks. Most women have had their flow stop by 12 weeks after delivery.  You should change your sanitary pads frequently.  Wash your hands thoroughly with soap and water for at least 20 seconds after changing pads, using the toilet, or before holding or feeding your newborn.  You should feel like you need to empty your bladder within the first 6 8 hours after delivery.  In case you become weak, lightheaded, or faint, call  your nurse before you get out of bed for the first time and before you take a shower for the first time.  Within the first few days after delivery, your breasts may begin to feel tender and full. This is called engorgement. Breast tenderness usually goes away within 48 72 hours after engorgement occurs. You may also notice milk leaking from your breasts. If you are not breastfeeding, do not stimulate your breasts. Breast stimulation can make your breasts produce more milk.  Spending as much time as possible with your newborn is very important. During this time, you and your newborn can feel close and get to know each other. Having your newborn stay in your room (rooming in) will help to strengthen the bond  with your newborn. It will give you time to get to know your newborn and become comfortable caring for your newborn.  Your hormones change after delivery. Sometimes the hormone changes can temporarily cause you to feel sad or tearful. These feelings should not last more than a few days. If these feelings last longer than that, you should talk to your caregiver.  If desired, talk to your caregiver about methods of family planning or contraception.  Talk to your caregiver about immunizations. Your caregiver may want you to have the following immunizations before leaving the hospital:  Tetanus, diphtheria, and pertussis (Tdap) or tetanus and diphtheria (Td) immunization. It is very important that you and your family (including grandparents) or others caring for your newborn are up-to-date with the Tdap or Td immunizations. The Tdap or Td immunization can help protect your newborn from getting ill.  Rubella immunization.  Varicella (chickenpox) immunization.  Influenza immunization. You should receive this annual immunization if you did not receive the immunization during your pregnancy. Document Released: 08/16/2007 Document Revised: 07/13/2012 Document Reviewed: 06/15/2012 [facility] Patient  Information 2014 [place], [place].   Postpartum Depression and Baby Blues  The postpartum period begins right after the birth of a baby. During this time, there is often a great amount of joy and excitement. It is also a time of considerable changes in the life of the parent(s). Regardless of how many times a mother gives birth, each child brings new challenges and dynamics to the family. It is not unusual to have feelings of excitement accompanied by confusing shifts in moods, emotions, and thoughts. All mothers are at risk of developing postpartum depression or the "baby blues." These mood changes can occur right after giving birth, or they may occur many months after giving birth. The baby blues or postpartum depression can be mild or severe. Additionally, postpartum depression can resolve rather quickly, or it can be a long-term condition. CAUSES Elevated hormones and their rapid decline are thought to be a main cause of postpartum depression and the baby blues. There are a number of hormones that radically change during and after pregnancy. Estrogen and progesterone usually decrease immediately after delivering your baby. The level of thyroid hormone and various cortisol steroids also rapidly drop. Other factors that play a major role in these changes include major life events and genetics.  RISK FACTORS If you have any of the following risks for the baby blues or postpartum depression, know what symptoms to watch out for during the postpartum period. Risk factors that may increase the likelihood of getting the baby blues or postpartum depression include:  Havinga personal or family history of depression.  Having depression while being pregnant.  Having premenstrual or oral contraceptive-associated mood issues.  Having exceptional life stress.  Having marital conflict.  Lacking a social support network.  Having a baby with special needs.  Having health problems such as  diabetes. SYMPTOMS Baby blues symptoms include:  Brief fluctuations in mood, such as going from extreme happiness to sadness.  Decreased concentration.  Difficulty sleeping.  Crying spells, tearfulness.  Irritability.  Anxiety. Postpartum depression symptoms typically begin within the first month after giving birth. These symptoms include:  Difficulty sleeping or excessive sleepiness.  Marked weight loss.  Agitation.  Feelings of worthlessness.  Lack of interest in activity or food. Postpartum psychosis is a very concerning condition and can be dangerous. Fortunately, it is rare. Displaying any of the following symptoms is cause for immediate medical attention. Postpartum psychosis symptoms include:  Hallucinations and delusions.  Bizarre or disorganized behavior.  Confusion or disorientation. DIAGNOSIS  A diagnosis is made by an evaluation of your symptoms. There are no medical or lab tests that lead to a diagnosis, but there are various questionnaires that a caregiver may use to identify those with the baby blues, postpartum depression, or psychosis. Often times, a screening tool called the [place] Postnatal Depression Scale is used to diagnose depression in the postpartum period.  TREATMENT The baby blues usually goes away on its own in 1 to 2 weeks. Social support is often all that is needed. You should be encouraged to get adequate sleep and rest. Occasionally, you may be given medicines to help you sleep.  Postpartum depression requires treatment as it can last several months or longer if it is not treated. Treatment may include individual or group therapy, medicine, or both to address any social, physiological, and psychological factors that may play a role in the depression. Regular exercise, a healthy diet, rest, and social support may also be strongly recommended.  Postpartum psychosis is more serious and needs treatment right away. Hospitalization is often  needed. HOME CARE INSTRUCTIONS  Get as much rest as you can. Nap when the baby sleeps.  Exercise regularly. Some women find yoga and walking to be beneficial.  Eat a balanced and nourishing diet.  Do little things that you enjoy. Have a cup of tea, take a bubble bath, read your favorite magazine, or listen to your favorite music.  Avoid alcohol.  Ask for help with household chores, cooking, grocery shopping, or running errands as needed. Do not try to do everything.  Talk to people close to you about how you are feeling. Get support from your partner, family members, friends, or other new moms.  Try to stay positive in how you think. Think about the things you are grateful for.  Do not spend a lot of time alone.  Only take medicine as directed by your caregiver.  Keep all your postpartum appointments.  Let your caregiver know if you have any concerns. SEEK MEDICAL CARE IF: You are having a reaction or problems with your medicine. SEEK IMMEDIATE MEDICAL CARE IF:  You have suicidal feelings.  You feel you may harm the baby or someone else. Document Released: 07/23/2004 Document Revised: 01/11/2012 Document Reviewed: 08/25/2011 [facility] Patient Information 2014 [place], [place].   Discharge to: home  Follow-up Information   Follow up with [facility]. Schedule an appointment as soon as possible for a visit in 6 weeks. (Call for appointment )    Specialty:  Obstetrics and Gynecology   Contact information:   [address] [place] [address] [phone]       Nigel Bridgeman 09/02/2013

## 2013-09-02 NOTE — Lactation Note (Signed)
This note was copied from the chart of Girl Jhada Risk. Lactation Consultation Note     Initial consult with this mom of a 33 1/[redacted] week gestation baby. Mom is 42 years old, and has breast fed her first 4 children, with a great supply.   I reviewed the NICU booklet on providing EBM for a NICU baby, and rented mom a DEP and instructed her in it's use. I showed mom how to hand express, and was able to collect a few large drops of colostrum for her baby. Mom knows to call lactation for questions/conerns. She is being discharged to home after 4pm today.  Patient Name: Girl Christe Tellez [id number] Date: 09/02/2013 Reason for consult: Initial assessment;NICU baby   Maternal Data Formula Feeding for Exclusion: Yes (baby in NICU) Infant to breast within first hour of birth: No Breastfeeding delayed due to:: Infant status Has patient been taught Hand Expression?: Yes Does the patient have breastfeeding experience prior to this delivery?: Yes  Feeding    LATCH Score/Interventions                      Lactation Tools Discussed/Used Tools: Pump Breast pump type: Double-Electric Breast Pump WIC Program: No Initiated by::  bedside RN - mom had to go to OR after delivery for retained placental fragments, so was not started within 6 hours of delivery Date initiated:: 09/02/13   Consult Status Consult Status: Follow-up Date: 09/03/13 Follow-up type:  (in NICU prn)    Alfred Levins 09/02/2013, 12:42 PM

## 2013-09-02 NOTE — Progress Notes (Signed)
Dr. Su Hilt in to see patient in NICU. Patient elects to stay until tomorrow--more tired this afternoon than she had anticipated. Will d/c tomorrow.  Nigel Bridgeman, CNM 09/02/13 4:10p

## 2013-09-02 NOTE — Anesthesia Postprocedure Evaluation (Signed)
  Anesthesia Post-op Note  Patient: Tiffany Sharp  Procedure(s) Performed: Procedure(s): DILATATION AND EVACUATION (N/A)  Patient Location: [facility]  Anesthesia Type:Epidural  Level of Consciousness: awake  Airway and Oxygen Therapy: Patient Spontanous Breathing  Post-op Pain: mild  Post-op Assessment: Patient's Cardiovascular Status Stable and Respiratory Function Stable  Post-op Vital Signs: stable  Complications: No apparent anesthesia complications

## 2013-09-03 NOTE — Discharge Summary (Signed)
Obstetric Discharge Summary Reason for Admission: PPROM at 32w 6d Prenatal Procedures: ultrasound Intrapartum Procedures: spontaneous vaginal delivery Postpartum Procedures: D &C -retained placenta Complications-Operative and Postpartum: None Hemoglobin  Date Value Range Status  09/02/2013 9.2* 12.0 - 15.0 g/dL Final  1/61/0960 45.4   Final     HCT  Date Value Range Status  09/02/2013 27.2* 36.0 - 46.0 % Final  02/28/2013 38   Final   Hospital Course: The patient was admitted for PROM at 32 6/7 weeks, with no labor. She was given betamethasone x 2 doses and placed on ATB for latency. She had onset of labor and elevated temp early morning of 10/31, then was transferred to [facility] for labor care. An epidural was placed, and the patient progressed to delivery on the afternoon of 09/02/13. Baby was transferred to NICU, but was stable. Her labor was not complicated, but she then was noted to have a retained placenta--D&C was performed by Dr. Su Hilt, with ? Accreta features noted. Patient tolerated the procedure well, and was taken to [facility] for her postpartum course .She was placed on Amp/Gent x 24 hours after delivery. She desired to be d/c'd on day 1 after completion of the ATB course--Dr. Su Hilt was consulted and was agreeable with that request however she then changed her mind and desired to be discharged on day 2.     Physical Exam:  General: alert, cooperative and no distress Heart:  RRR Lungs:  CTA bilat Abd:  Soft, NT with pos BS x 4 quads Lochia: appropriate, sm rubra Uterine Fundus: firm, NT 2 below umb Incision: N/A DVT Evaluation: No evidence of DVT seen on physical exam. Negative Homan's sign. No significant calf/ankle edema.  Discharge Diagnoses: PPROM at 32w 6d                                         Retained placenta-S/P D&C  Discharge Information: Date: 09/03/2013 Activity: pelvic rest Diet: routine Medications: PNV and Ibuprofen Condition:  stable Instructions: refer to practice specific booklet Discharge to: home Follow-up Information   Follow up with [facility]. Schedule an appointment as soon as possible for a visit in 6 weeks. (Call for appointment )    Specialty:  Obstetrics and Gynecology   Contact information:   [address] [place] [address] [phone]      Newborn Data: Live born female  Birth Weight: 5 lb 2.9 oz (2350 g) APGAR: 7, 7  Infant remains in NICU.  Wyoma Genson O. 09/03/2013, 11:46 AM

## 2013-09-03 NOTE — Progress Notes (Signed)
Discharge instructions reviewed with patient and significant other.  Both state understanding of home care, medications, activity, signs/symptoms to report to MD and return MD office visit.  No home equipment needed.  Patient discharged via wheelchair in stable condition with staff without incident.

## 2013-09-04 ENCOUNTER — Ambulatory Visit: Payer: Self-pay

## 2013-09-04 ENCOUNTER — Inpatient Hospital Stay (HOSPITAL_COMMUNITY)
Admission: AD | Admit: 2013-09-04 | Discharge: 2013-09-07 | DRG: 776 | Disposition: A | Discharge status: Home / Self Care | Payer: BC Managed Care – PPO | Source: Ambulatory Visit | Attending: Obstetrics and Gynecology | Admitting: Obstetrics and Gynecology

## 2013-09-04 ENCOUNTER — Encounter (HOSPITAL_COMMUNITY): Payer: Self-pay | Admitting: Obstetrics and Gynecology

## 2013-09-04 DIAGNOSIS — F3289 Other specified depressive episodes: Secondary | ICD-10-CM | POA: Diagnosis present

## 2013-09-04 DIAGNOSIS — O99345 Other mental disorders complicating the puerperium: Secondary | ICD-10-CM | POA: Diagnosis present

## 2013-09-04 DIAGNOSIS — F329 Major depressive disorder, single episode, unspecified: Secondary | ICD-10-CM | POA: Diagnosis present

## 2013-09-04 DIAGNOSIS — O8612 Endometritis following delivery: Principal | ICD-10-CM | POA: Diagnosis present

## 2013-09-04 DIAGNOSIS — O9081 Anemia of the puerperium: Secondary | ICD-10-CM | POA: Diagnosis present

## 2013-09-04 DIAGNOSIS — D649 Anemia, unspecified: Secondary | ICD-10-CM | POA: Diagnosis present

## 2013-09-04 LAB — CBC WITH DIFFERENTIAL/PLATELET
Basophils Relative: 0 % (ref 0–1)
Eosinophils Relative: 0 % (ref 0–5)
HCT: 26.9 % — ABNORMAL LOW (ref 36.0–46.0)
Hemoglobin: 8.9 g/dL — ABNORMAL LOW (ref 12.0–15.0)
Lymphocytes Relative: 5 % — ABNORMAL LOW (ref 12–46)
Lymphs Abs: 0.8 10*3/uL (ref 0.7–4.0)
MCHC: 33.1 g/dL (ref 30.0–36.0)
MCV: 72.1 fL — ABNORMAL LOW (ref 78.0–100.0)
Monocytes Absolute: 1.7 10*3/uL — ABNORMAL HIGH (ref 0.1–1.0)
Monocytes Relative: 10 % (ref 3–12)
Neutro Abs: 15 10*3/uL — ABNORMAL HIGH (ref 1.7–7.7)
Neutrophils Relative %: 86 % — ABNORMAL HIGH (ref 43–77)
RBC: 3.73 MIL/uL — ABNORMAL LOW (ref 3.87–5.11)
WBC: 17.5 10*3/uL — ABNORMAL HIGH (ref 4.0–10.5)

## 2013-09-04 NOTE — Lactation Note (Signed)
This note was copied from the chart of Tiffany Brightyn Mozer. Lactation Consultation Note Mom at baby's bedside in NICU. Assist mom to position and latch baby at the breast. No latch achieved, baby licking and nuzzling. Discussed nipple shield and its advantages for a baby at this gestational age, mom agrees to try nipple shield. Mom fitted with #20 nipple shield, baby latched right away, and stayed latched with rhythmic sucking for 10 minutes while receiving NG feed.  Mom has concerns about her milk supply. Discussed normal milk supply based on baby's age (now 73 hours old), mom getting about 15 to 20 mL with each pump, 8 times per day. Discussed hand expression, mom states she is hand expressing in addition to pumping.  Discussed power pumping; written instructions provided for mom to power pump once a day. Enc mom to reduce stress, get plenty of rest, eat healthy and drink plenty of fluids. Discussed stress reduction strategies. Dad at bedside for discussion, very supportive, is going to help reduce mom's stress and worry.  Enc mom to call for breastfeeding help if needed.  Will continue to follow up in NICU.  Patient Name: Tiffany Sharp [id number] Date: 09/04/2013 Reason for consult: NICU baby;Follow-up assessment   Maternal Data Has patient been taught Hand Expression?: Yes  Feeding Feeding Type: Breast Milk Length of feed: 30 min (10 min of sucking )  LATCH Score/Interventions Latch: Repeated attempts needed to sustain latch, nipple held in mouth throughout feeding, stimulation needed to elicit sucking reflex.  Audible Swallowing: A few with stimulation  Type of Nipple: Everted at rest and after stimulation  Comfort (Breast/Nipple): Soft / non-tender     Hold (Positioning): No assistance needed to correctly position infant at breast.  LATCH Score: 8  Lactation Tools Discussed/Used     Consult Status Consult Status: Follow-up Follow-up type: In-patient    Tiffany Sharp [facility] 09/04/2013, 2:25 PM

## 2013-09-04 NOTE — Progress Notes (Signed)
History   42yo, [id number] delivered NSVD on 09/01/13 followed by a D&E for retained placenta and ? partial accreta.  Pt presents today with fever and chills starting about 1500 this afternoon.  Highest temp was 102.  Pt took Tylenol 1300 mg around 1900.  Pt denies abdominal pain, or breast pain and lochia has been light.  Denies resp or GI c/o's, UTI or PIH s/s.  Chief Complaint  Patient presents with  . Postpartum Complications   The history is provided by the patient.    OB History   Grav Para Term Preterm Abortions TAB SAB Ect Mult Living   8 5 4 1 3  3   5       Past Medical History  Diagnosis Date  . Toxic shock syndrome   . Abscess   . Hypertension   . Eclampsia   . Depression     PPD  . HSV-2 (herpes simplex virus 2) infection   . HELLP (hemolytic anemia/elev liver enzymes/low platelets in pregnancy)   . Breast lump   . Postpartum hemorrhage   . Endometritis   . Knee instability   . IBS (irritable bowel syndrome)     Past Surgical History  Procedure Laterality Date  . Dilation and curettage of uterus    . Incise and drain abcess  1996    under left arm  . Dilation and evacuation N/A 09/01/2013    Procedure: DILATATION AND EVACUATION;  Surgeon: Purcell Nails, MD;  Location: [facility];  Service: Gynecology;  Laterality: N/A;    History reviewed. No pertinent family history.  History  Substance Use Topics  . Smoking status: Never Smoker   . Smokeless tobacco: Not on file  . Alcohol Use: No    Allergies:  Allergies  Allergen Reactions  . Penicillins Hives  . Sulfa Antibiotics Hives    Prescriptions prior to admission  Medication Sig Dispense Refill  . acetaminophen (TYLENOL) 325 MG tablet Take 650 mg by mouth every 6 (six) hours as needed for pain.      Marland Kitchen DIGESTIVE ENZYMES PO Take 1 tablet by mouth daily.        Marland Kitchen docusate sodium (COLACE) 100 MG capsule Take 100 mg by mouth daily.        . folic acid (FOLVITE) 1 MG tablet Take 1 mg by mouth daily.       Marland Kitchen ibuprofen (ADVIL,MOTRIN) 600 MG tablet Take 1 tablet (600 mg total) by mouth every 6 (six) hours.  36 tablet  2  . IRON PO Take 1 tablet by mouth daily.      . Nutritional Supplements (BOOST/FIBER PO) Take 1 tablet by mouth daily.          ROS Physical Exam   Blood pressure 97/44, pulse 137, temperature 102.5 F (39.2 C), temperature source Oral, resp. rate 18, height 5\' 11"  (1.803 m), weight 230 lb (104.327 kg), last menstrual period 01/14/2013, SpO2 100.00%, currently breastfeeding.  Physical Exam  Constitutional: She is oriented to person, place, and time. She appears well-developed and well-nourished.  HENT:  Head: Normocephalic.  Neck: Normal range of motion.  Cardiovascular: Normal rate and regular rhythm.   Respiratory: Effort normal.  GI: Soft. She exhibits no distension and no mass. There is no tenderness. There is no rebound and no guarding.  Musculoskeletal: Normal range of motion.  Neurological: She is alert and oriented to person, place, and time.  Skin: Skin is warm and dry.  Psychiatric: She has a normal mood  and affect. Her behavior is normal.  Breast: soft, non-tender, no tissue breakdown  ED Course  PP day #4 S/p NSVD, D&E PP fever  CBC CMET U/S  C/w Dr. Charlotta Newton Admission to 3rd Floor Med/Surg   Haroldine Laws CNM, MSN 09/04/2013 11:59 PM

## 2013-09-04 NOTE — MAU Note (Signed)
Pt post vag delivery 10/31, PROM, fever today 102.

## 2013-09-05 ENCOUNTER — Inpatient Hospital Stay (HOSPITAL_COMMUNITY): Payer: BC Managed Care – PPO

## 2013-09-05 LAB — COMPREHENSIVE METABOLIC PANEL
Albumin: 2.5 g/dL — ABNORMAL LOW (ref 3.5–5.2)
Alkaline Phosphatase: 116 U/L (ref 39–117)
BUN: 8 mg/dL (ref 6–23)
CO2: 22 mEq/L (ref 19–32)
Calcium: 8.4 mg/dL (ref 8.4–10.5)
Chloride: 104 mEq/L (ref 96–112)
Creatinine, Ser: 0.69 mg/dL (ref 0.50–1.10)
GFR calc non Af Amer: >90 mL/min (ref >90)
Glucose, Bld: 114 mg/dL — ABNORMAL HIGH (ref 70–99)
Potassium: 3.5 mEq/L (ref 3.5–5.1)
Total Bilirubin: 0.5 mg/dL (ref 0.3–1.2)

## 2013-09-05 LAB — CBC WITH DIFFERENTIAL/PLATELET
Basophils Absolute: 0 10*3/uL (ref 0.0–0.1)
Basophils Relative: 0 % (ref 0–1)
Hemoglobin: 8 g/dL — ABNORMAL LOW (ref 12.0–15.0)
MCHC: 33.2 g/dL (ref 30.0–36.0)
Monocytes Relative: 9 % (ref 3–12)
Neutro Abs: 13.6 10*3/uL — ABNORMAL HIGH (ref 1.7–7.7)
Neutrophils Relative %: 83 % — ABNORMAL HIGH (ref 43–77)
Platelets: 165 10*3/uL (ref 150–400)
RDW: 15.7 % — ABNORMAL HIGH (ref 11.5–15.5)

## 2013-09-05 LAB — TYPE AND SCREEN
ABO/RH(D): A POS
Antibody Screen: NEGATIVE
Unit division: 0

## 2013-09-05 LAB — MRSA PCR SCREENING: MRSA by PCR: NEGATIVE

## 2013-09-05 MED ORDER — LACTATED RINGERS IV SOLN
INTRAVENOUS | Status: DC
  Administered 2013-09-05 – 2013-09-06 (×3): via INTRAVENOUS

## 2013-09-05 MED ORDER — IBUPROFEN 600 MG PO TABS
600.0000 mg | ORAL_TABLET | Freq: Once | ORAL | Status: AC
  Administered 2013-09-05: 600 mg via ORAL
  Filled 2013-09-05: qty 1

## 2013-09-05 MED ORDER — ONDANSETRON HCL 4 MG PO TABS: 4.0000 mg | ORAL_TABLET | Freq: Four times a day (QID) | ORAL | Status: DC | PRN

## 2013-09-05 MED ORDER — VANCOMYCIN HCL IN DEXTROSE 1-5 GM/200ML-% IV SOLN
1000.0000 mg | Freq: Two times a day (BID) | INTRAVENOUS | Status: DC
  Administered 2013-09-05 – 2013-09-07 (×5): 1000 mg via INTRAVENOUS
  Filled 2013-09-05 (×7): qty 200

## 2013-09-05 MED ORDER — FERROUS SULFATE 325 (65 FE) MG PO TABS
325.0000 mg | ORAL_TABLET | Freq: Two times a day (BID) | ORAL | Status: DC
  Administered 2013-09-05 – 2013-09-07 (×5): 325 mg via ORAL
  Filled 2013-09-05 (×5): qty 1

## 2013-09-05 MED ORDER — SERTRALINE HCL 25 MG PO TABS
25.0000 mg | ORAL_TABLET | Freq: Every day | ORAL | Status: DC
  Administered 2013-09-05 – 2013-09-07 (×3): 25 mg via ORAL
  Filled 2013-09-05 (×4): qty 1

## 2013-09-05 MED ORDER — LACTATED RINGERS IV BOLUS (SEPSIS)
1000.0000 mL | Freq: Once | INTRAVENOUS | Status: AC
  Administered 2013-09-05: 1000 mL via INTRAVENOUS

## 2013-09-05 MED ORDER — DOXYCYCLINE HYCLATE 100 MG PO TABS
100.0000 mg | ORAL_TABLET | Freq: Two times a day (BID) | ORAL | Status: DC
  Administered 2013-09-05 – 2013-09-07 (×5): 100 mg via ORAL
  Filled 2013-09-05 (×7): qty 1

## 2013-09-05 MED ORDER — PRENATAL MULTIVITAMIN CH
1.0000 | ORAL_TABLET | Freq: Every day | ORAL | Status: DC
  Administered 2013-09-05 – 2013-09-06 (×2): 1 via ORAL
  Filled 2013-09-05 (×3): qty 1

## 2013-09-05 MED ORDER — IBUPROFEN 600 MG PO TABS
600.0000 mg | ORAL_TABLET | Freq: Four times a day (QID) | ORAL | Status: DC | PRN
  Administered 2013-09-05 – 2013-09-07 (×4): 600 mg via ORAL
  Filled 2013-09-05 (×4): qty 1

## 2013-09-05 MED ORDER — DOCUSATE SODIUM 100 MG PO CAPS
100.0000 mg | ORAL_CAPSULE | Freq: Two times a day (BID) | ORAL | Status: DC
  Administered 2013-09-05 – 2013-09-07 (×5): 100 mg via ORAL
  Filled 2013-09-05 (×6): qty 1

## 2013-09-05 MED ORDER — ONDANSETRON HCL 4 MG/2ML IJ SOLN: 4.0000 mg | Freq: Four times a day (QID) | INTRAMUSCULAR | Status: DC | PRN

## 2013-09-05 NOTE — Lactation Note (Signed)
This note was copied from the chart of Tiffany Sharp. Lactation Consultation Note     Follow up consult with this mom readmitted for antibiotics, and baby in NICU, now 53 days old, and 33 5/[redacted] weeks gestation. She was concerned that she had a low milk supply, so I went to her room to help her with pumping. Mom has breast fed 4 other children with a good supply. This is her fort premature baby, and is fighting an infection . I told her she should keep pumping, and that her milk may be delayed due to her not feeling well. She has been febrile today. On exam, mom's breasts are full. And mom was still using the premie setting. With the standard setting,and massage, mom's milk was squiring into the bottles. She expressed between 45-60 mls of transitional milk, which I explained was a very good amount. I advised mom to purchase a hands free bra, so she could massage her breast while pumping. I also advised her to add hand expression after each pumping.      Mom reports tenderness under her left armpit. She had surgery there in the past for an infected area, that caused her to go into toxic shock.  On exam, I felt what seemed to be an enlarged lymph gland. I told mom's nurse, and advised mom to alert her OB when she sees her next. I do not think it is related to lactaiton.   I will follow up with mom tomorrow, and advised her to keep pumping every 3 hours, up to 30 minutes.  Patient Name: Tiffany Sharp [id number] Date: 09/05/2013     Maternal Data    Feeding Feeding Type: Breast Milk with Formula added Nipple Type: Slow - flow Length of feed: 35 min  LATCH Score/Interventions                      Lactation Tools Discussed/Used     Consult Status      Alfred Levins 09/05/2013, 5:04 PM

## 2013-09-05 NOTE — H&P (Signed)
Postpartum H&P  S: 42y [id number] who presents for chills that started this afternoon around 3pm and noted a fever.  She did not have any symptoms prior to today.  She reports minimal lochia, no abdominal pain.  No chest pain/SOB/no cough.  No dysuria or hematuria.  Pt reports no acute complaints other than the chills.  Pt is breast pumping as her baby is in the nursery.  Patient is very tearful and feeling very overwhelmed about current situation.  She has a h/o postpartum depression and feels like she is on that same path.  She denies taking medication for this in the past.  This pregnancy was @ [redacted]w[redacted]d due to PPROM @ [redacted]w[redacted]d with retained placenta requiring D&C.  Pregnancy was complicated by:  Patient Active Problem List   Diagnosis Date Noted  . Preterm delivery 09/02/2013  . GBS (group B Streptococcus carrier), +RV culture, currently pregnant 08/28/2013  . Thrombocytopenia, unspecified 08/28/2013  . HSV (herpes simplex virus) infection 08/25/2013  . H/O HELLP syndrome, currently pregnant 08/25/2013  . H/O PPH (postpartum hemorrhage) 08/25/2013  . Anemia 08/25/2013  . Breast lump 08/25/2013  . Allergy to penicillin 08/25/2013  . Allergy to sulfonamides 08/25/2013  . Rubella non-immune status, antepartum 08/25/2013  . History of postpartum depression, currently pregnant 08/25/2013  . History of eclampsia 08/25/2013  . Advanced maternal age (AMA), 40 years or greater 08/25/2013  . Obesity, unspecified 08/25/2013  . History of toxic shock syndrome 08/25/2013  . History of loop electrical excision procedure (LEEP) 08/25/2013  . H/O LGA (large for gestational age) fetus 08/25/2013  . Preterm premature rupture of membranes in third trimester 08/25/2013  . Breast asymmetry in female 10/12/2012   PMHx: Per records h/o includes: HELLP, PPH, Endometritis, IBS, axillary abscess  PSHx: D&C, drainage of abscess in '97 complicated by Toxic Shock Syndrome  Current Outpatient Prescriptions on File  Prior to Encounter  Medication Sig Dispense Refill  . acetaminophen (TYLENOL) 325 MG tablet Take 650 mg by mouth every 6 (six) hours as needed for pain.      Marland Kitchen DIGESTIVE ENZYMES PO Take 1 tablet by mouth daily.        Marland Kitchen docusate sodium (COLACE) 100 MG capsule Take 100 mg by mouth daily.        . folic acid (FOLVITE) 1 MG tablet Take 1 mg by mouth daily.      Marland Kitchen ibuprofen (ADVIL,MOTRIN) 600 MG tablet Take 1 tablet (600 mg total) by mouth every 6 (six) hours.  36 tablet  2  . IRON PO Take 1 tablet by mouth daily.      . Nutritional Supplements (BOOST/FIBER PO) Take 1 tablet by mouth daily.          Allergies  Allergen Reactions  . Penicillins Hives  . Sulfa Antibiotics Hives   Sochx: No tobacco/EtOH/other drugs Family hx: no breast, uterine, ovarian or colon cancer  O: BP 126/88  Pulse 131  Temp(Src) 100.6 F (38.1 C) (Oral)  Resp 20  Ht 5\' 11"  (1.803 m)  Wt 104.327 kg (230 lb)  BMI 32.09 kg/m2  SpO2 100%  LMP 01/14/2013  Breastfeeding? Yes Tmax: 102.5 @ 2241 HR range: 125-139  Gen: NAD CV: Tachycardic Lungs: CTAB Breast: no nipple discharge appreciated, no masses in right breast, left breast with tender 2-3cm mobile mass in axilla (per pt this is the same spot of her prior abscess)  No induration or fluctuance appreciated.  No erythema or exudate. Abd: soft, non-tender, no distension, no rebound,  no guarding. No CVA tenderness Uterus firm below umbilicus, non-tender to palpation. GU: On speculum exam- cervix visualized- no active bleeding, no mucopurulent drainage.  On bimanual exam- cervix closed, no CMT, no adnexal masses palpated, non-tender to exam Ext: minimal non-pitting edema, no calf tenderness bilaterally  Labs:  CBC    Component Value Date/Time   WBC 17.5* 09/04/2013 2345   RBC 3.73* 09/04/2013 2345   HGB 8.9* 09/04/2013 2345   HGB 12.2 02/28/2013   HCT 26.9* 09/04/2013 2345   HCT 38 02/28/2013   PLT 170 09/04/2013 2345   PLT 162 02/28/2013   MCV 72.1* 09/04/2013  2345   MCH 23.9* 09/04/2013 2345   MCHC 33.1 09/04/2013 2345   RDW 15.7* 09/04/2013 2345   LYMPHSABS 0.8 09/04/2013 2345   MONOABS 1.7* 09/04/2013 2345   EOSABS 0.0 09/04/2013 2345   BASOSABS 0.0 09/04/2013 2345  Results for ALVIA, JABLONSKI (MRN [id number]) as of 09/05/2013 01:49  Ref. Range 09/02/2013 05:10 09/04/2013 23:45  Sodium Latest Range: 135-145 mEq/L  137  Potassium Latest Range: 3.5-5.1 mEq/L  3.5  Chloride Latest Range: 96-112 mEq/L  104  CO2 Latest Range: 19-32 mEq/L  22  BUN Latest Range: 6-23 mg/dL  8  Creatinine Latest Range: 0.50-1.10 mg/dL  3.08  Calcium Latest Range: 8.4-10.5 mg/dL  8.4  GFR calc non Af Amer Latest Range: >90 mL/min  >90  GFR calc Af Amer Latest Range: >90 mL/min  >90  Glucose Latest Range: 70-99 mg/dL  657 (H)  Alkaline Phosphatase Latest Range: 39-117 U/L  116  Albumin Latest Range: 3.5-5.2 g/dL  2.5 (L)  AST Latest Range: 0-37 U/L  37  ALT Latest Range: 0-35 U/L  36 (H)  Total Protein Latest Range: 6.0-8.3 g/dL  5.4 (L)  Total Bilirubin Latest Range: 0.3-1.2 mg/dL  0.5     A/P: 83O [id number] admitted for sepsis- unknown etiology  ID:   -Based on the patient's complicated delivery course, there is concern for endometritis; however, the patient has no uterine tenderness on examination.  [place] ordered -Left axillary mass- cannot rule out as possible abscess.  No fluctuant area present requiring immediate drainage -Will start on IV vancomycin to cover for potential MRSA abscess -UA, urine culture and blood culture ordered -repeat CBC in am -pt has already had 1g of tylenol in past 12hr, will hold tylenol until am -Motrin prn fever  FEN: LR bolus 1 liter than 125cc/hr Strict I/Os Gen diet as tolerated CMP stable  PPX:  encourage ambulation as tolerated, while in bed SCDs  Postpartum care:  motrin prn, will restart tylenol in am Ok to breastpump Colace ordered zofran prn  Postpartum Depression: -Haroldine Laws at bedside reviewing current  management, pt interested in started a medication -will start Zoloft 25mg  daily  Myna Hidalgo, DO [phone] (pager) [phone] (office)

## 2013-09-05 NOTE — Progress Notes (Signed)
Post Partum Day 4  Readmission for fever Subjective:  Well but still feels feverish Lochia are normal. Voiding, ambulating, tolerating normal diet. Breast pumping going well. Baby is doing well in NICU  Objective: Blood pressure 115/56, pulse 110, temperature 99.2 F (37.3 C), temperature source Oral, resp. rate 20, height 5\' 11"  (1.803 m), weight 230 lb (104.327 kg), last menstrual period 01/14/2013, SpO2 100.00%, currently breastfeeding. Last temp 100.1  Physical Exam:  General: normal Lochia: appropriate Uterine Fundus: 0/3 firm non-tender  Extremities: No evidence of DVT seen on physical exam. Edema 1+     Recent Labs  09/04/13 2345 09/05/13 0525  HGB 8.9* 8.0*  HCT 26.9* 24.1*  WBC 16.5 from 17.5 Blood culture pending Urine culture pending  Assessment/Plan: Suspected endometritis Will add Doxycycline PO Will plan D/C after 24 hours afebrile  LOS: 1 day   Deaundra Kutzer A MD 09/05/2013, 2:48 PM

## 2013-09-06 LAB — CBC WITH DIFFERENTIAL/PLATELET
Basophils Absolute: 0 10*3/uL (ref 0.0–0.1)
Hemoglobin: 8.8 g/dL — ABNORMAL LOW (ref 12.0–15.0)
Lymphocytes Relative: 19 % (ref 12–46)
Lymphs Abs: 1.6 10*3/uL (ref 0.7–4.0)
MCV: 72.9 fL — ABNORMAL LOW (ref 78.0–100.0)
Neutro Abs: 5.9 10*3/uL (ref 1.7–7.7)
Neutrophils Relative %: 68 % (ref 43–77)
Platelets: 170 10*3/uL (ref 150–400)
RBC: 3.69 MIL/uL — ABNORMAL LOW (ref 3.87–5.11)
RDW: 15.7 % — ABNORMAL HIGH (ref 11.5–15.5)
WBC: 8.6 10*3/uL (ref 4.0–10.5)

## 2013-09-06 NOTE — Anesthesia Postprocedure Evaluation (Signed)
  Anesthesia Post-op Note  Patient: Tiffany Sharp  Procedure(s) Performed: * No procedures listed *  Patient Location: PACU and Mother/Baby  Anesthesia Type:Epidural  Level of Consciousness: awake, alert , oriented and patient cooperative  Airway and Oxygen Therapy: Patient Spontanous Breathing  Post-op Pain: none  Post-op Assessment: Post-op Vital signs reviewed, Patient's Cardiovascular Status Stable and Respiratory Function Stable  Post-op Vital Signs: Reviewed and stable  Complications: No apparent anesthesia complications

## 2013-09-06 NOTE — Progress Notes (Signed)
Ur chart review completed.  

## 2013-09-06 NOTE — Progress Notes (Signed)
09/06/13 1300  Clinical Encounter Type  Visited With Patient not available  Referral From Nurse   Attempted visits twice today, but pt not in room.  Please page Spiritual Care as needed for support.  Thank you.  [address] [place], [place] [phone]

## 2013-09-06 NOTE — Progress Notes (Signed)
Post Partum Day 5  Readmission for fever Vancomycin #2 Doxycycline #1 Subjective:  Feeling much better! Lochia are normal. Voiding, ambulating, tolerating normal diet. Breast pumping going well. Baby is doing well in NICU  Objective: Blood pressure 127/75, pulse 85, temperature 98.5 F (36.9 C), temperature source Oral, resp. rate 18, height 5\' 11"  (1.803 m), weight 230 lb (104.327 kg), last menstrual period 01/14/2013, SpO2 97.00%, currently breastfeeding. Last temp 100.1  Physical Exam:  General: normal Lochia: appropriate Uterine Fundus: 0/3 firm non-tender  Extremities: No evidence of DVT seen on physical exam. Edema 1+     Recent Labs  09/05/13 0525 09/06/13 0515  HGB 8.0* 8.8*  HCT 24.1* 26.9*  WBC 8.6  from 17.5 Blood culture pending Urine culture pending  Assessment/Plan: Suspected endometritis Will add Doxycycline PO Will plan D/C after 24 hours afebrile Plan D/C home tomorrow with Doxycycline BID for a total of 10 days  LOS: 2 days   Author Hatlestad A MD 09/06/2013, 12:33 PM

## 2013-09-07 ENCOUNTER — Ambulatory Visit: Payer: Self-pay

## 2013-09-07 MED ORDER — DOXYCYCLINE HYCLATE 100 MG PO CAPS: 100.0000 mg | ORAL_CAPSULE | Freq: Two times a day (BID) | ORAL | Status: DC

## 2013-09-07 MED ORDER — HYDROCHLOROTHIAZIDE 25 MG PO TABS: 25.0000 mg | ORAL_TABLET | Freq: Every day | ORAL | Status: DC

## 2013-09-07 NOTE — Lactation Note (Addendum)
This note was copied from the chart of Tiffany Sharp. Lactation Consultation Note    Follow up consult with this mom and baby, in NICU, now 82 days old and [redacted] weeks gestation. Mom had been using a 20 nipple shield to latch  Baby. I assisted mom with cross cradle hold, without shield, and baby latched well. Nipple large for the baby, but she was able to latch just beyond mom's nipple. Mom has easily expressed milk. The baby suckled with strong suck ,while being ng fed. I will follow this family in the NICU.  Patient Name: Tiffany Jalon Blackwelder BJYNW'G Date: 09/07/2013     Maternal Data    Feeding Feeding Type: Breast Milk with Formula added Nipple Type: Slow - flow Length of feed: 30 min  LATCH Score/Interventions                      Lactation Tools Discussed/Used     Consult Status      Alfred Levins 09/07/2013, 4:23 PM

## 2013-09-07 NOTE — OR Nursing (Signed)
Case ended at 31.  Tonette Bihari RN

## 2013-09-08 NOTE — Discharge Summary (Signed)
Discharge Summary for [facility] Post Partum Patient  Tiffany Sharp  DOB:    [birth year] MRN:    [id number] CSN:    [id number]  Date of admission:                  September 04, 2013  Date of discharge:                   September 07, 2013  Admission Diagnosis:  Postpartum day 4  Postpartum endometritis  Anemia  Postpartum depression  Discharge Diagnosis:  same  Procedures this admission:  IV vancomycin  History of Present Illness:  [name] is a 42 y.o. female, [id number], who presents after having a vaginal delivery on September 01, 2013.  The patient ruptured her membranes at 32 weeks and 6 days gestation.  She was hospitalized and given antibiotics.  She began to labor on September 01, 2013.  She delivered a healthy, premature female infant.  The patient received antibiotics during her hospital stay.  Her postpartum course was complicated by a retained placenta that required a uterine curettage.  On the day of this admission the patient reports having fever and chills.  She was noted to be febrile. The patient has been followed at the [facility] division of [facility].   Hospital course:  The patient was admitted for IV vancomycin.  Doxycycline was also given by mouth. The patient's maximum temperature was 102.5.  Her blood cultures are negative to date.  Her white blood cell count was elevated.   The patient quickly felt better after she received antibiotics.  She was afebrile for greater than 24 hours prior to discharge. She was discharged to home doing well.  Discharge Physical Exam:  BP 132/81  Pulse 80  Temp(Src) 97.7 F (36.5 C) (Oral)  Resp 18  Ht 5\' 11"  (1.803 m)  Wt 230 lb (104.327 kg)  BMI 32.09 kg/m2  SpO2 97%  LMP 01/14/2013  Breastfeeding? Yes   General: alert and no distress Resp: clear to auscultation bilaterally Cardio: regular rate and rhythm, S1, S2 normal, no murmur, click, rub  or gallop GI: soft and nontender  Exam deferred.  Discharge Information:  Activity:           pelvic rest Diet:                routine Medications: PNV, Ibuprofen, Colace, Iron, Percocet and doxycycline 100 mg twice each day for 10 days Condition:      stable and improved Instructions:  endometritis Discharge to: home  Follow-up Information   Follow up with [facility] In 2 weeks.   Contact information:   [address], [address] [place] [address]        Janine Limbo 09/08/2013

## 2013-09-11 ENCOUNTER — Ambulatory Visit: Payer: Self-pay

## 2013-09-11 LAB — CULTURE, BLOOD (ROUTINE X 2): Culture: NO GROWTH

## 2013-09-11 NOTE — Lactation Note (Signed)
This note was copied from the chart of Tiffany Sharp. Lactation Consultation Note     Follow up consult with this mom of a NICU baby, Now 34 4/[redacted] weeks gestation, and 80 days old. The baby is doing well with feeding. I will do a pre and post weight with a 3 pm feed tomorrow.   Patient Name: Tiffany Chirstina Haan ZOXWR'U Date: 09/11/2013 Reason for consult: Follow-up assessment;NICU baby   Maternal Data    Feeding Feeding Type: Breast Milk with Formula added Nipple Type: Slow - flow Length of feed: 20 min  LATCH Score/Interventions                      Lactation Tools Discussed/Used     Consult Status Consult Status: Follow-up Date: 09/12/13 (3 pm) Follow-up type: In-patient    Alfred Levins 09/11/2013, 3:12 PM

## 2014-05-21 ENCOUNTER — Ambulatory Visit: Payer: BC Managed Care – PPO | Attending: Orthopedic Surgery | Admitting: Rehabilitation

## 2014-05-21 DIAGNOSIS — M2240 Chondromalacia patellae, unspecified knee: Secondary | ICD-10-CM | POA: Insufficient documentation

## 2014-05-21 DIAGNOSIS — M25569 Pain in unspecified knee: Secondary | ICD-10-CM | POA: Insufficient documentation

## 2014-05-21 DIAGNOSIS — IMO0001 Reserved for inherently not codable concepts without codable children: Secondary | ICD-10-CM | POA: Insufficient documentation

## 2014-05-24 ENCOUNTER — Ambulatory Visit: Payer: BC Managed Care – PPO | Admitting: Rehabilitation

## 2014-05-24 DIAGNOSIS — IMO0001 Reserved for inherently not codable concepts without codable children: Secondary | ICD-10-CM | POA: Diagnosis not present

## 2014-05-28 ENCOUNTER — Ambulatory Visit: Payer: BC Managed Care – PPO | Admitting: Rehabilitation

## 2014-05-28 DIAGNOSIS — IMO0001 Reserved for inherently not codable concepts without codable children: Secondary | ICD-10-CM | POA: Diagnosis not present

## 2014-05-30 ENCOUNTER — Encounter: Payer: 59 | Admitting: Rehabilitation

## 2014-06-04 ENCOUNTER — Ambulatory Visit: Payer: BC Managed Care – PPO | Admitting: Rehabilitation

## 2014-06-06 ENCOUNTER — Ambulatory Visit: Payer: BC Managed Care – PPO | Attending: Orthopedic Surgery | Admitting: Rehabilitation

## 2014-06-06 DIAGNOSIS — M25569 Pain in unspecified knee: Secondary | ICD-10-CM | POA: Diagnosis not present

## 2014-06-06 DIAGNOSIS — IMO0001 Reserved for inherently not codable concepts without codable children: Secondary | ICD-10-CM | POA: Insufficient documentation

## 2014-06-06 DIAGNOSIS — M2240 Chondromalacia patellae, unspecified knee: Secondary | ICD-10-CM | POA: Diagnosis not present

## 2014-06-11 ENCOUNTER — Ambulatory Visit: Payer: BC Managed Care – PPO | Admitting: Rehabilitation

## 2014-06-11 DIAGNOSIS — IMO0001 Reserved for inherently not codable concepts without codable children: Secondary | ICD-10-CM | POA: Diagnosis not present

## 2014-06-13 ENCOUNTER — Ambulatory Visit: Payer: BC Managed Care – PPO | Admitting: Rehabilitation

## 2014-06-18 ENCOUNTER — Ambulatory Visit: Payer: BC Managed Care – PPO | Admitting: Rehabilitation

## 2014-06-18 DIAGNOSIS — IMO0001 Reserved for inherently not codable concepts without codable children: Secondary | ICD-10-CM | POA: Diagnosis not present

## 2014-06-20 ENCOUNTER — Ambulatory Visit: Payer: BC Managed Care – PPO | Admitting: Rehabilitation

## 2014-06-20 DIAGNOSIS — IMO0001 Reserved for inherently not codable concepts without codable children: Secondary | ICD-10-CM | POA: Diagnosis not present

## 2014-06-25 ENCOUNTER — Ambulatory Visit: Payer: BC Managed Care – PPO | Admitting: Rehabilitation

## 2014-09-03 ENCOUNTER — Encounter (HOSPITAL_COMMUNITY): Payer: Self-pay | Admitting: Obstetrics and Gynecology

## 2014-09-04 ENCOUNTER — Other Ambulatory Visit: Payer: Self-pay | Admitting: Obstetrics and Gynecology

## 2014-09-04 DIAGNOSIS — N644 Mastodynia: Secondary | ICD-10-CM

## 2014-09-04 DIAGNOSIS — N6489 Other specified disorders of breast: Secondary | ICD-10-CM

## 2014-09-17 ENCOUNTER — Encounter (INDEPENDENT_AMBULATORY_CARE_PROVIDER_SITE_OTHER): Payer: Self-pay

## 2014-09-17 ENCOUNTER — Ambulatory Visit
Admission: RE | Admit: 2014-09-17 | Discharge: 2014-09-17 | Disposition: A | Discharge status: Home / Self Care | Payer: BC Managed Care – PPO | Source: Ambulatory Visit | Attending: Obstetrics and Gynecology | Admitting: Obstetrics and Gynecology

## 2014-09-17 DIAGNOSIS — N6489 Other specified disorders of breast: Secondary | ICD-10-CM

## 2014-09-17 DIAGNOSIS — N644 Mastodynia: Secondary | ICD-10-CM

## 2014-11-02 LAB — HM PAP SMEAR: HM PAP: NORMAL

## 2015-05-30 ENCOUNTER — Telehealth: Payer: Self-pay | Admitting: Behavioral Health

## 2015-05-30 ENCOUNTER — Encounter: Payer: Self-pay | Admitting: Behavioral Health

## 2015-05-30 NOTE — Telephone Encounter (Signed)
Pre-Visit Call completed with patient and chart updated.   Pre-Visit Info documented in Specialty Comments under [employer].    

## 2015-06-03 ENCOUNTER — Ambulatory Visit (INDEPENDENT_AMBULATORY_CARE_PROVIDER_SITE_OTHER): Payer: BLUE CROSS/BLUE SHIELD | Admitting: Family

## 2015-06-03 ENCOUNTER — Encounter: Payer: Self-pay | Admitting: Family

## 2015-06-03 VITALS — BP 110/78 | HR 71 | Temp 97.8°F | Resp 16 | Ht 70.5 in | Wt 233.8 lb

## 2015-06-03 DIAGNOSIS — M25552 Pain in left hip: Secondary | ICD-10-CM

## 2015-06-03 DIAGNOSIS — M25559 Pain in unspecified hip: Secondary | ICD-10-CM | POA: Insufficient documentation

## 2015-06-03 DIAGNOSIS — E663 Overweight: Secondary | ICD-10-CM | POA: Insufficient documentation

## 2015-06-03 DIAGNOSIS — Z0000 Encounter for general adult medical examination without abnormal findings: Secondary | ICD-10-CM

## 2015-06-03 DIAGNOSIS — Z8659 Personal history of other mental and behavioral disorders: Secondary | ICD-10-CM

## 2015-06-03 DIAGNOSIS — F909 Attention-deficit hyperactivity disorder, unspecified type: Secondary | ICD-10-CM

## 2015-06-03 DIAGNOSIS — Z8759 Personal history of other complications of pregnancy, childbirth and the puerperium: Secondary | ICD-10-CM

## 2015-06-03 LAB — HEPATIC FUNCTION PANEL
ALT: 14 U/L (ref 0–35)
AST: 15 U/L (ref 0–37)
Albumin: 3.9 g/dL (ref 3.5–5.2)
Alkaline Phosphatase: 74 U/L (ref 39–117)
Bilirubin, Direct: 0.2 mg/dL (ref 0.0–0.3)
Total Bilirubin: 0.8 mg/dL (ref 0.2–1.2)
Total Protein: 6.6 g/dL (ref 6.0–8.3)

## 2015-06-03 LAB — CBC WITH DIFFERENTIAL/PLATELET
Basophils Absolute: 0 10*3/uL (ref 0.0–0.1)
Basophils Relative: 0.4 % (ref 0.0–3.0)
Eosinophils Absolute: 0.2 10*3/uL (ref 0.0–0.7)
Eosinophils Relative: 2.6 % (ref 0.0–5.0)
HCT: 40.7 % (ref 36.0–46.0)
Hemoglobin: 13.3 g/dL (ref 12.0–15.0)
LYMPHS ABS: 1.8 10*3/uL (ref 0.7–4.0)
LYMPHS PCT: 28.7 % (ref 12.0–46.0)
MCHC: 32.5 g/dL (ref 30.0–36.0)
MCV: 72 fl — ABNORMAL LOW (ref 78.0–100.0)
MONOS PCT: 7.2 % (ref 3.0–12.0)
Monocytes Absolute: 0.5 10*3/uL (ref 0.1–1.0)
NEUTROS PCT: 61.1 % (ref 43.0–77.0)
Neutro Abs: 3.9 10*3/uL (ref 1.4–7.7)
Platelets: 177 10*3/uL (ref 150.0–400.0)
RBC: 5.66 Mil/uL — ABNORMAL HIGH (ref 3.87–5.11)
RDW: 15.9 % — ABNORMAL HIGH (ref 11.5–15.5)
WBC: 6.4 10*3/uL (ref 4.0–10.5)

## 2015-06-03 LAB — URINALYSIS, ROUTINE W REFLEX MICROSCOPIC
Hgb urine dipstick: NEGATIVE
Ketones, ur: NEGATIVE
LEUKOCYTES UA: NEGATIVE
Nitrite: NEGATIVE
RBC / HPF: NONE SEEN (ref 0–?)
SPECIFIC GRAVITY, URINE: 1.025 (ref 1.000–1.030)
Total Protein, Urine: NEGATIVE
URINE GLUCOSE: NEGATIVE
UROBILINOGEN UA: 0.2 (ref 0.0–1.0)
pH: 5.5 (ref 5.0–8.0)

## 2015-06-03 LAB — LIPID PANEL
CHOL/HDL RATIO: 3
Cholesterol: 133 mg/dL (ref 0–200)
HDL: 43.5 mg/dL (ref >39.00)
LDL CALC: 78 mg/dL (ref 0–99)
NonHDL: 89.54
TRIGLYCERIDES: 56 mg/dL (ref 0.0–149.0)
VLDL: 11.2 mg/dL (ref 0.0–40.0)

## 2015-06-03 LAB — BASIC METABOLIC PANEL
BUN: 8 mg/dL (ref 6–23)
CALCIUM: 8.9 mg/dL (ref 8.4–10.5)
CO2: 22 meq/L (ref 19–32)
CREATININE: 0.6 mg/dL (ref 0.40–1.20)
Chloride: 108 mEq/L (ref 96–112)
GFR: 139.15 mL/min (ref >60.00)
Glucose, Bld: 107 mg/dL — ABNORMAL HIGH (ref 70–99)
POTASSIUM: 3.9 meq/L (ref 3.5–5.1)
SODIUM: 140 meq/L (ref 135–145)

## 2015-06-03 LAB — TSH: TSH: 1.94 u[IU]/mL (ref 0.35–4.50)

## 2015-06-03 NOTE — Assessment & Plan Note (Signed)
Advised pt to follow up with ortho.

## 2015-06-03 NOTE — Progress Notes (Signed)
Pre visit review using our clinic review tool, if applicable. No additional management support is needed unless otherwise documented below in the visit note. 

## 2015-06-03 NOTE — Assessment & Plan Note (Signed)
We discussed exercising in the pool and limiting calories using an app such as my fitness pal.  Goal weight loss is 1-2 pounds/week.

## 2015-06-03 NOTE — Assessment & Plan Note (Signed)
This is managed by Dr Cathie Beams.

## 2015-06-03 NOTE — Assessment & Plan Note (Signed)
Currently stable.

## 2015-06-03 NOTE — Patient Instructions (Signed)
Please complete lab work prior to leaving. Schedule a complete physical at the front desk. Welcome to [facility]!

## 2015-06-03 NOTE — Progress Notes (Signed)
Subjective:    Patient ID: Tiffany Sharp, female    DOB: 07-10-1971, 44 y.o.   MRN: [id number]  HPI  Tiffany Sharp is a 44 yr old female who presents today to establish care.  She presents today with chief complaint of left groin pain.  Symptoms have been present x 3 months. Reports that symptoms have been intermittent in nature until 4 days ago when symptoms became constant.  Reports left hip pain.  She sees PA at [place].    She has hx of Depression- Pt reports hx of post partum depression.  Reports hx of ADHD.  Recently started Evekeo, tried vyvanse prior.  vyvanse helped but she felt "loopy."  She is seeing Dr. Jeannene Patella Sharp.    Also has hx of HTN- only during pregnancy.   BP Readings from Last 3 Encounters:  06/03/15 110/78  09/07/13 132/81  09/03/13 104/58   She expresses frustration with difficulty losing weight.  Hip and knee pain limits her ability to exercise.     Review of Systems  Constitutional: Negative for unexpected weight change.  HENT: Negative for hearing loss and rhinorrhea.   Eyes: Negative for visual disturbance.  Respiratory: Negative for cough.   Cardiovascular: Negative for leg swelling.  Gastrointestinal: Negative for nausea, diarrhea and constipation.  Genitourinary: Negative for dysuria and frequency.  Musculoskeletal:       Bilateral knee pain  Skin: Negative for rash.  Neurological: Negative for headaches.  Hematological: Negative for adenopathy.  Psychiatric/Behavioral:       See HPI       Past Medical History  Diagnosis Date  . Toxic shock syndrome 1997    started with an abscess right axilla  . Abscess   . Hypertension   . Eclampsia   . Depression     PPD  . HSV-2 (herpes simplex virus 2) infection   . HELLP (hemolytic anemia/elev liver enzymes/low platelets in pregnancy)   . Breast lump     had biopsy, benign  . Postpartum hemorrhage   . Endometritis   . Knee instability   . IBS (irritable bowel syndrome)     History    Social History  . Marital Status: Married    Spouse Name: N/A  . Number of Children: N/A  . Years of Education: N/A   Occupational History  . Not on file.   Social History Main Topics  . Smoking status: Never Smoker   . Smokeless tobacco: Not on file  . Alcohol Use: No  . Drug Use: No  . Sexual Activity: Not Currently    Birth Control/ Protection: None   Other Topics Concern  . 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 Dilation and curettage of uterus    . Incise and drain abcess  1996    under left arm  . Dilation and evacuation N/A 09/01/2013    Procedure: DILATATION AND EVACUATION;  Surgeon: Delice Lesch, MD;  Location: [facility];  Service: Gynecology;  Laterality: N/A;  . Breast biopsy  2013    benign    Family History  Problem Relation Age of Onset  . Cancer Mother     rare form of melanoma  . Hypertension Father   .  Heart disease Father     Allergies  Allergen Reactions  . Penicillins Hives  . Sulfa Antibiotics Hives    Current Outpatient Prescriptions on File Prior to Visit  Medication Sig Dispense Refill  . DIGESTIVE ENZYMES PO Take 1 tablet by mouth daily.      Marland Kitchen docusate sodium (COLACE) 100 MG capsule Take 100 mg by mouth daily.       No current facility-administered medications on file prior to visit.    BP 110/78 mmHg  Pulse 71  Temp(Src) 97.8 F (36.6 C) (Oral)  Resp 16  Ht 5' 10.5" (1.791 m)  Wt 233 lb 12.8 oz (106.051 kg)  BMI 33.06 kg/m2  SpO2 98%  LMP 05/27/2015    Objective:   Physical Exam  Constitutional: She is oriented to person, place, and time. She appears well-developed and well-nourished.  HENT:  Head: Normocephalic and atraumatic.  Right Ear: Tympanic membrane and ear canal normal.  Left Ear: Tympanic membrane and ear canal normal.   Cardiovascular: Normal rate, regular rhythm and normal heart sounds.   No murmur heard. Pulmonary/Chest: Effort normal and breath sounds normal. No respiratory distress. She has no wheezes.  Abdominal: She exhibits no distension. There is no tenderness. There is no rebound and no guarding.  Musculoskeletal: She exhibits no edema.  Left groin pain with left hip abduction  Neurological: She is alert and oriented to person, place, and time.  Psychiatric: She has a normal mood and affect. Her behavior is normal. Judgment and thought content normal.          Assessment & Plan:

## 2015-06-05 ENCOUNTER — Other Ambulatory Visit (INDEPENDENT_AMBULATORY_CARE_PROVIDER_SITE_OTHER): Payer: BLUE CROSS/BLUE SHIELD

## 2015-06-05 ENCOUNTER — Telehealth: Payer: Self-pay | Admitting: Family

## 2015-06-05 ENCOUNTER — Encounter: Payer: Self-pay | Admitting: Family

## 2015-06-05 DIAGNOSIS — R739 Hyperglycemia, unspecified: Secondary | ICD-10-CM

## 2015-06-05 LAB — HEMOGLOBIN A1C: Hgb A1c MFr Bld: 5.8 % (ref 4.6–6.5)

## 2015-06-05 NOTE — Telephone Encounter (Signed)
See mychart.  

## 2015-08-14 ENCOUNTER — Other Ambulatory Visit: Payer: Self-pay

## 2015-08-14 DIAGNOSIS — Z1231 Encounter for screening mammogram for malignant neoplasm of breast: Secondary | ICD-10-CM

## 2015-09-20 ENCOUNTER — Ambulatory Visit
Admission: RE | Admit: 2015-09-20 | Discharge: 2015-09-20 | Disposition: A | Discharge status: Home / Self Care | Payer: BLUE CROSS/BLUE SHIELD | Source: Ambulatory Visit

## 2015-09-20 DIAGNOSIS — Z1231 Encounter for screening mammogram for malignant neoplasm of breast: Secondary | ICD-10-CM

## 2015-10-17 ENCOUNTER — Telehealth: Payer: Self-pay | Admitting: Family

## 2015-10-17 NOTE — Telephone Encounter (Signed)
Pt says that she had her flu shot at [employer]. She wasn't sure of the date.

## 2015-10-17 NOTE — Telephone Encounter (Signed)
Documented 10/17/15.

## 2015-12-23 NOTE — Progress Notes (Signed)
Cardiology Office Note:    Date:  12/24/2015   ID:  Tiffany Sharp, DOB 02-15-1971, MRN [id number]  PCP:  Nance Pear., NP  Cardiologist:  New - Dr. Virl Axe   Electrophysiologist:  n/a  Chief Complaint  Patient presents with  . Palpitations    History of Present Illness:     Tiffany Sharp is a 45 y.o. female with a hx of depression, pregnancy induced HTN, ADHD.    Her husband is a Theme park manager who is friends with Dr. Caryl Comes.  She recently contacted Dr. Caryl Comes to discuss symptoms of palpitation and dyspnea.  She was therefore set up for evaluation today.  She notes symptoms x 1 week.  They seem to be more frequent.  She notes all symptoms at rest.  She feels her heart flutter.  Rate seems rapid. She then has discomfort described as tightness or pressure.  She feels dyspneic after this. She seems to have dyspnea at other times. However, she denies exertional chest discomfort or DOE. She denies pleuritic chest pain or recent travel.  She denies orthopnea, PND, edema.  Denies syncope. Denies postural dizziness. Denies sudden weight gain.  No significant medication changes.  She denies excessive caffeine use.  Denies excessive stress or lack of sleep.  She denies any OTC medication use.     Past Medical History  Diagnosis Date  . Toxic shock syndrome ([place]) 1997    started with an abscess right axilla  . Abscess   . Pregnancy induced hypertension     no meds outside of pregnancy  . Eclampsia   . Depression     PPD - post partum depression  . HSV-2 (herpes simplex virus 2) infection   . HELLP (hemolytic anemia/elev liver enzymes/low platelets in pregnancy)   . Breast lump     had biopsy, benign  . Postpartum hemorrhage   . Endometritis   . Knee instability   . IBS (irritable bowel syndrome)   . Murmur     told this as a child - no specifict testing ever done    Past Surgical History  Procedure Laterality Date  . Dilation and curettage of uterus    . Incise and drain  abcess  1996    under left arm  . Dilation and evacuation N/A 09/01/2013    Procedure: DILATATION AND EVACUATION;  Surgeon: Delice Lesch, MD;  Location: [facility];  Service: Gynecology;  Laterality: N/A;  . Breast biopsy  2013    benign    Current Medications: Outpatient Prescriptions Prior to Visit  Medication Sig Dispense Refill  . co-enzyme Q-10 30 MG capsule Take 300 mg by mouth daily.    . Cyanocobalamin (B-12 PO) Take 1 each by mouth daily. Mix in drink    . DIGESTIVE ENZYMES PO Take 1 tablet by mouth daily.      Marland Kitchen docusate sodium (COLACE) 100 MG capsule Take 100 mg by mouth daily.      Marland Kitchen etodolac (LODINE) 400 MG tablet Take 400 mg by mouth 2 (two) times daily. Reported on 12/24/2015    . EVEKEO 10 MG TABS Take 10 mg by mouth daily. Reported on 12/24/2015  0   No facility-administered medications prior to visit.     Allergies:   Penicillins and Sulfa antibiotics   Social History   Social History  . Marital Status: Married    Spouse Name: N/A  . Number of Children: N/A  . 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 Smoking status: Never Smoker   . Smokeless tobacco: None  . Alcohol Use: No  . Drug Use: No  . Sexual Activity: Not Currently    Birth Control/ Protection: None   Other Topics Concern  . 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 There is no history of Heart attack.   ROS:   Please see the history of present illness.    Review of Systems  Cardiovascular: Positive for chest pain and irregular heartbeat.  Respiratory: Positive for shortness of breath.   All other  systems reviewed and are negative.   Physical Exam:    VS:  BP 124/80 mmHg  Pulse 82  Ht 5\' 11"  (1.803 m)  Wt 229 lb 3.2 oz (103.964 kg)  BMI 31.98 kg/m2  SpO2 98%   GEN: Well nourished, well developed, in no acute distress HEENT: normal Neck: no JVD, no masses Cardiac: Normal S1/S2, RRR; early, brief 1/6 systolic murmur RUSB, no rubs, or gallops, no edema;  no carotid bruits,   Respiratory:  clear to auscultation bilaterally; no wheezing, rhonchi or rales GI: soft, nontender  MS: no deformity or atrophy Skin: warm and dry  Neuro:   no focal deficits  Psych: Alert and oriented x 3, normal affect  Wt Readings from Last 3 Encounters:  12/24/15 229 lb 3.2 oz (103.964 kg)  06/03/15 233 lb 12.8 oz (106.051 kg)  09/04/13 230 lb (104.327 kg)      Studies/Labs Reviewed:     EKG:  EKG is  ordered today.  The ekg ordered today demonstrates NSR, HR 81, normal axis, QTC 439 ms  Recent Labs: 06/03/2015: ALT 14; BUN 8; Creatinine, Ser 0.60; Hemoglobin 13.3; Platelets 177.0; Potassium 3.9; Sodium 140; TSH 1.94   Recent Lipid Panel    Component Value Date/Time   CHOL 133 06/03/2015 0808   TRIG 56.0 06/03/2015 0808   HDL 43.50 06/03/2015 0808   CHOLHDL 3 06/03/2015 0808   VLDL 11.2 06/03/2015 0808   LDLCALC 78 06/03/2015 0808    Additional studies/ records that were reviewed today include:   None    ASSESSMENT:     1. Shortness of breath   2. Palpitations   3. History of pregnancy induced hypertension   4. Attention deficit hyperactivity disorder (ADHD), unspecified ADHD type   5. Other chest pain     PLAN:     In order of problems listed above:  1. Shortness of breath - Etiology not clear.  SOB is episodic and sometimes seems to occur with palpitations. She denies exertional dyspnea. She does not appear to be volume overloaded.  Her ECG is normal.  She has a benign sounding murmur on exam.  She has palpitations that occur daily.  She was also seen by Dr. Virl Axe  today.   -  Arrange Echocardiogram  -  Arrange 48 Hr Holter  -  Labs today: DDimer, BNP, BMET, Mg2+, TSH  -  FU with Dr. Virl Axe prn (depending on results of testing)  2. Palpitations - Suspicious  for premature contractions.  Obtain labs and monitor as noted.    3. Pregnancy Induced HTN - BP is controlled. She has not had to take BP meds outside of pregnancy.  We have asked her to keep monitoring her BP and FU with PCP.  4. ADD - Stimulant use is not new.  Do not suspect medications are causing her symptoms at this point.    5. Chest pain - Atypical.  ECG is normal.  We recommend proceeding with Echo and monitoring first.  If symptoms persist, we can consider plain ETT.      Medication Adjustments/Labs and Tests Ordered: Current medicines are reviewed at length with the patient today.  Concerns regarding medicines are outlined above.  Medication changes, Labs and Tests ordered today are outlined in the Patient Instructions noted below. Patient Instructions  Medication Instructions:  Your physician recommends that you continue on your current medications as directed. Please refer to the Current Medication list given to you today.  Labwork: TODAY BMET, MAGNESIUM LEVEL, TSH, BNP, D-DIMER  Testing/Procedures: 1. Your physician has recommended that you wear a 48 HOUR holter monitor. Holter monitors are medical devices that record the heart's electrical activity. Doctors most often use these monitors to diagnose arrhythmias. Arrhythmias are problems with the speed or rhythm of the heartbeat. The monitor is a small, portable device. You can wear one while you do your normal daily activities. This is usually used to diagnose what is causing palpitations/syncope (passing out).  2. Your physician has requested that you have an echocardiogram. Echocardiography is a painless test that uses sound waves to create images of your heart. It provides your doctor with information about the size and  shape of your heart and how well your heart's chambers and valves are working. This procedure takes approximately one hour. There are no restrictions for this procedure.  Follow-Up: AS NEEDED WITH DR. Caryl Comes  Any Other Special Instructions Will Be Listed Below (If Applicable).  If you need a refill on your cardiac medications before your next appointment, please call your pharmacy.   Signed, Richardson Dopp, PA-C  12/24/2015 3:15 PM    [facility] [address], [place], [place]  [address] Phone: [phone]; Fax: [phone]

## 2015-12-24 ENCOUNTER — Encounter: Payer: Self-pay | Admitting: Physician Assistant

## 2015-12-24 ENCOUNTER — Ambulatory Visit (INDEPENDENT_AMBULATORY_CARE_PROVIDER_SITE_OTHER): Payer: BLUE CROSS/BLUE SHIELD | Admitting: Physician Assistant

## 2015-12-24 VITALS — BP 124/80 | HR 82 | Ht 71.0 in | Wt 229.2 lb

## 2015-12-24 DIAGNOSIS — R0602 Shortness of breath: Secondary | ICD-10-CM

## 2015-12-24 DIAGNOSIS — Z8759 Personal history of other complications of pregnancy, childbirth and the puerperium: Secondary | ICD-10-CM

## 2015-12-24 DIAGNOSIS — F909 Attention-deficit hyperactivity disorder, unspecified type: Secondary | ICD-10-CM | POA: Diagnosis not present

## 2015-12-24 DIAGNOSIS — R002 Palpitations: Secondary | ICD-10-CM | POA: Diagnosis not present

## 2015-12-24 DIAGNOSIS — R0789 Other chest pain: Secondary | ICD-10-CM

## 2015-12-24 NOTE — Patient Instructions (Addendum)
Medication Instructions:  Your physician recommends that you continue on your current medications as directed. Please refer to the Current Medication list given to you today.  Labwork: TODAY BMET, MAGNESIUM LEVEL, TSH, BNP, D-DIMER  Testing/Procedures: 1. Your physician has recommended that you wear a 48 HOUR holter monitor. Holter monitors are medical devices that record the heart's electrical activity. Doctors most often use these monitors to diagnose arrhythmias. Arrhythmias are problems with the speed or rhythm of the heartbeat. The monitor is a small, portable device. You can wear one while you do your normal daily activities. This is usually used to diagnose what is causing palpitations/syncope (passing out).  2. Your physician has requested that you have an echocardiogram. Echocardiography is a painless test that uses sound waves to create images of your heart. It provides your doctor with information about the size and shape of your heart and how well your heart's chambers and valves are working. This procedure takes approximately one hour. There are no restrictions for this procedure.  Follow-Up: AS NEEDED WITH DR. Caryl Comes  Any Other Special Instructions Will Be Listed Below (If Applicable).  If you need a refill on your cardiac medications before your next appointment, please call your pharmacy.

## 2015-12-25 ENCOUNTER — Ambulatory Visit (HOSPITAL_COMMUNITY): Payer: BLUE CROSS/BLUE SHIELD | Attending: Physician Assistant

## 2015-12-25 ENCOUNTER — Other Ambulatory Visit: Payer: Self-pay | Admitting: *Deleted

## 2015-12-25 ENCOUNTER — Encounter: Payer: Self-pay | Admitting: Physician Assistant

## 2015-12-25 ENCOUNTER — Ambulatory Visit (INDEPENDENT_AMBULATORY_CARE_PROVIDER_SITE_OTHER): Payer: BLUE CROSS/BLUE SHIELD

## 2015-12-25 ENCOUNTER — Other Ambulatory Visit: Payer: Self-pay

## 2015-12-25 DIAGNOSIS — R002 Palpitations: Secondary | ICD-10-CM | POA: Diagnosis present

## 2015-12-25 DIAGNOSIS — R0602 Shortness of breath: Secondary | ICD-10-CM

## 2015-12-25 DIAGNOSIS — I253 Aneurysm of heart: Secondary | ICD-10-CM | POA: Diagnosis not present

## 2015-12-25 DIAGNOSIS — Z9889 Other specified postprocedural states: Secondary | ICD-10-CM

## 2015-12-25 DIAGNOSIS — I517 Cardiomegaly: Secondary | ICD-10-CM | POA: Diagnosis not present

## 2015-12-25 LAB — BASIC METABOLIC PANEL
BUN: 11 mg/dL (ref 7–25)
CALCIUM: 9.1 mg/dL (ref 8.6–10.2)
CO2: 24 mmol/L (ref 20–31)
Chloride: 102 mmol/L (ref 98–110)
Creat: 0.64 mg/dL (ref 0.50–1.10)
GLUCOSE: 91 mg/dL (ref 65–99)
POTASSIUM: 4.5 mmol/L (ref 3.5–5.3)
SODIUM: 140 mmol/L (ref 135–146)

## 2015-12-25 LAB — TSH: TSH: 1.28 m[IU]/L

## 2015-12-25 LAB — D-DIMER, QUANTITATIVE

## 2015-12-25 LAB — MAGNESIUM: MAGNESIUM: 2.1 mg/dL (ref 1.5–2.5)

## 2015-12-26 ENCOUNTER — Telehealth: Payer: Self-pay | Admitting: *Deleted

## 2015-12-26 NOTE — Telephone Encounter (Signed)
Pt notified of lab results by phone . Pt is agreeable to coming into the office for D-Dimer and BNP to be done tomorrow 2/24. These labs were supposed to be done yesterday.

## 2015-12-27 ENCOUNTER — Other Ambulatory Visit (INDEPENDENT_AMBULATORY_CARE_PROVIDER_SITE_OTHER): Payer: BLUE CROSS/BLUE SHIELD | Admitting: *Deleted

## 2015-12-27 ENCOUNTER — Telehealth: Payer: Self-pay | Admitting: *Deleted

## 2015-12-27 ENCOUNTER — Ambulatory Visit (INDEPENDENT_AMBULATORY_CARE_PROVIDER_SITE_OTHER): Payer: BLUE CROSS/BLUE SHIELD | Admitting: Family

## 2015-12-27 VITALS — BP 128/80 | HR 94 | Temp 98.0°F | Ht 71.0 in | Wt 229.2 lb

## 2015-12-27 DIAGNOSIS — R002 Palpitations: Secondary | ICD-10-CM | POA: Diagnosis not present

## 2015-12-27 DIAGNOSIS — R0602 Shortness of breath: Secondary | ICD-10-CM | POA: Diagnosis not present

## 2015-12-27 DIAGNOSIS — Z9889 Other specified postprocedural states: Secondary | ICD-10-CM

## 2015-12-27 LAB — BRAIN NATRIURETIC PEPTIDE: BRAIN NATRIURETIC PEPTIDE: 5 pg/mL (ref <100)

## 2015-12-27 NOTE — Progress Notes (Signed)
Pre visit review using our clinic review tool, if applicable. No additional management support is needed unless otherwise documented below in the visit note. 

## 2015-12-27 NOTE — Telephone Encounter (Signed)
Pt has been notified of echo results and findings by phone with verbal understanding. Pt aware Dr. Caryl Comes to also look at echo and that we will cb if there are any changes to be made. Pt asked if lab results in yet, I said yes but not read. Pt said ok.

## 2015-12-27 NOTE — Progress Notes (Signed)
Subjective:    Patient ID: Tiffany Sharp, female    DOB: 01/19/1971, 45 y.o.   MRN: [id number]  HPI   Pt went ot ED on the 17th for palpitations/sob. Reports that she has nasal congestion.  She did have cardiology consult on 12/24/15.   Palpitations are intermittent.  Palpitations started last Monday.  Reports that she continues to have palpitations on an intermittent basis.     Review of Systems Denies CP/swelling, weight gain.   BP Readings from Last 3 Encounters:  12/27/15 128/80  12/24/15 124/80  06/03/15 110/78   Past Medical History  Diagnosis Date  . Toxic shock syndrome ([place]) 1997    started with an abscess right axilla  . Abscess   . Pregnancy induced hypertension     no meds outside of pregnancy  . Eclampsia   . Depression     PPD - post partum depression  . HSV-2 (herpes simplex virus 2) infection   . HELLP (hemolytic anemia/elev liver enzymes/low platelets in pregnancy)   . Breast lump     had biopsy, benign  . Postpartum hemorrhage   . Endometritis   . Knee instability   . IBS (irritable bowel syndrome)   . Murmur     told this as a child - no specifict testing ever done  . History of echocardiogram     Echo 2/17: EF 60-65%, normal wall motion, mild RVE, atrial septal aneurysm (findings consistent with increased left atrial pressure)    Social History   Social History  . Marital Status: Married    Spouse Name: N/A  . Number of Children: N/A  . 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 Smoking status: Never Smoker   . Smokeless tobacco: Not on file  . Alcohol Use: No  . Drug Use: No  . Sexual Activity: Not Currently    Birth Control/ Protection: None   Other Topics Concern  . 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 Dilation and curettage of uterus    . Incise and drain abcess  1996    under left arm  . Dilation and evacuation N/A 09/01/2013    Procedure: DILATATION AND EVACUATION;  Surgeon: Delice Lesch, MD;  Location: [facility];  Service: Gynecology;  Laterality: N/A;  . Breast biopsy  2013    benign    Family History  Problem Relation Age of Onset  . Cancer Mother     rare form of melanoma (hands)  . Hypertension Father   . Heart disease Father     chf, no CAD  . Transient ischemic attack Father   . Heart attack Neg Hx     Allergies  Allergen Reactions  . Penicillins Hives  . Sulfa Antibiotics Hives    Current Outpatient Prescriptions on File Prior to Visit  Medication Sig Dispense Refill  . Amphetamine ER (DYANAVEL XR) 2.5 MG/ML SUER Take 2.5 mg by mouth daily.     Marland Kitchen co-enzyme Q-10 30 MG capsule Take 300 mg by mouth daily.    . Cyanocobalamin (B-12 PO) Take 1 each by mouth daily. Mix in drink    .  diclofenac (VOLTAREN) 75 MG EC tablet Take 75 mg by mouth 2 (two) times daily as needed for moderate pain (PAIN).     Marland Kitchen DIGESTIVE ENZYMES PO Take 1 tablet by mouth daily.      Marland Kitchen docusate sodium (COLACE) 100 MG capsule Take 100 mg by mouth daily.       No current facility-administered medications on file prior to visit.    BP 128/80 mmHg  Pulse 94  Temp(Src) 98 F (36.7 C) (Oral)  Ht 5\' 11"  (1.803 m)  Wt 229 lb 3.2 oz (103.964 kg)  BMI 31.98 kg/m2  SpO2 98%        Objective:   Physical Exam  Constitutional: She is oriented to person, place, and time. She appears well-developed and well-nourished.  HENT:  Head: Normocephalic and atraumatic.  Cardiovascular: Normal rate, regular rhythm and normal heart sounds.   No murmur heard. Pulmonary/Chest: Effort normal and breath sounds normal. No respiratory distress. She has no wheezes.  Neurological: She is alert and oriented to person, place, and time.  Skin: Skin is  warm and dry.  Psychiatric: She has a normal mood and affect. Her behavior is normal. Judgment and thought content normal.          Assessment & Plan:  Palpitations- pt is turning in her holter monitor today. I suspect cause is anxiety and PVC's.  She is scheduled to have BNP and D dimer checked by cardiology.  15 min spent with pt today. >50% of this time was spent counseling patient on anxiety and causes for palpitations.

## 2015-12-28 LAB — D-DIMER, QUANTITATIVE (NOT AT ARMC): D DIMER QUANT: 0.53 ug{FEU}/mL — AB (ref 0.00–0.48)

## 2015-12-30 ENCOUNTER — Telehealth: Payer: Self-pay | Admitting: *Deleted

## 2015-12-30 ENCOUNTER — Encounter (HOSPITAL_BASED_OUTPATIENT_CLINIC_OR_DEPARTMENT_OTHER): Payer: Self-pay

## 2015-12-30 ENCOUNTER — Ambulatory Visit (HOSPITAL_BASED_OUTPATIENT_CLINIC_OR_DEPARTMENT_OTHER)
Admission: RE | Admit: 2015-12-30 | Discharge: 2015-12-30 | Disposition: A | Discharge status: Home / Self Care | Payer: BLUE CROSS/BLUE SHIELD | Source: Ambulatory Visit | Attending: Physician Assistant | Admitting: Physician Assistant

## 2015-12-30 DIAGNOSIS — R7989 Other specified abnormal findings of blood chemistry: Secondary | ICD-10-CM

## 2015-12-30 DIAGNOSIS — R791 Abnormal coagulation profile: Secondary | ICD-10-CM | POA: Diagnosis not present

## 2015-12-30 DIAGNOSIS — R0602 Shortness of breath: Secondary | ICD-10-CM

## 2015-12-30 MED ORDER — IOHEXOL 350 MG/ML SOLN
100.0000 mL | Freq: Once | INTRAVENOUS | Status: AC | PRN
  Administered 2015-12-30: 100 mL via INTRAVENOUS

## 2015-12-30 NOTE — Telephone Encounter (Signed)
Pt has been notified of lab results and that she needs to have a CTA due to elevated D-dimer by phone. Pt advised to have done this week per Brynda Rim. PA and that CT dept will call to schedule. Pt agreeable to plan of care.

## 2015-12-30 NOTE — Telephone Encounter (Signed)
Pt advised per Dr. Caryl Comes he would like to have CTA done before Wed which pt has already been scheduled. Pt states she cannot do today in our office because she has to her pick her child up from school at 2:20, ask if could be done at [place]. I then called [place] and s/w Hoyle Sauer who said yes pt could come over this after noon for her CT A and she could see order in Epic. Hoyle Sauer advised to pleas have pt wait after CT is done while they call report into [place], [place]. Pt advised of all information and instructions as well with verbal understanding.

## 2015-12-31 ENCOUNTER — Telehealth: Payer: Self-pay | Admitting: *Deleted

## 2015-12-31 NOTE — Telephone Encounter (Signed)
Pt notified no PE and to continue on current regimen. Pt said thank you for the good news.

## 2016-01-01 ENCOUNTER — Other Ambulatory Visit: Payer: BLUE CROSS/BLUE SHIELD

## 2016-01-08 ENCOUNTER — Telehealth: Payer: Self-pay | Admitting: *Deleted

## 2016-01-08 NOTE — Telephone Encounter (Signed)
I notified pt tonight per Brynda Rim. PA and Dr. Caryl Comes that the elevated pressure LA pressure is insignificant and does not require any further testing. Pt said thank you.

## 2016-03-17 DIAGNOSIS — F902 Attention-deficit hyperactivity disorder, combined type: Secondary | ICD-10-CM | POA: Diagnosis not present

## 2016-04-08 DIAGNOSIS — M25562 Pain in left knee: Secondary | ICD-10-CM | POA: Diagnosis not present

## 2016-04-08 DIAGNOSIS — M1612 Unilateral primary osteoarthritis, left hip: Secondary | ICD-10-CM | POA: Diagnosis not present

## 2016-04-08 DIAGNOSIS — M25561 Pain in right knee: Secondary | ICD-10-CM | POA: Diagnosis not present

## 2016-05-22 ENCOUNTER — Telehealth: Payer: Self-pay | Admitting: Family

## 2016-05-22 DIAGNOSIS — M542 Cervicalgia: Secondary | ICD-10-CM | POA: Diagnosis not present

## 2016-05-22 DIAGNOSIS — Z6831 Body mass index (BMI) 31.0-31.9, adult: Secondary | ICD-10-CM | POA: Diagnosis not present

## 2016-05-22 DIAGNOSIS — M62838 Other muscle spasm: Secondary | ICD-10-CM | POA: Diagnosis not present

## 2016-05-22 NOTE — Telephone Encounter (Signed)
Relation to [id number] Call back number:[phone]   Reason for call:  Patient states she needs her CPE before 8/31 due to insurance incentive, advised patient next available is not until 07/03/16 and advised she would be put on wait list. Patient would like to be scheduled sooner. Please advise

## 2016-05-25 NOTE — Telephone Encounter (Signed)
If the 1 45 and 2pm slots are still open on 05/27/16, you may combine those to get her in earlier. She should not eat anything for 6 hours prior to that but she can drink water / black coffee, diet drinks up until her appt.

## 2016-05-25 NOTE — Telephone Encounter (Signed)
Patient scheduled physical appointment for 06/02/2016

## 2016-06-02 ENCOUNTER — Ambulatory Visit (INDEPENDENT_AMBULATORY_CARE_PROVIDER_SITE_OTHER): Payer: BLUE CROSS/BLUE SHIELD | Admitting: Family

## 2016-06-02 ENCOUNTER — Other Ambulatory Visit (INDEPENDENT_AMBULATORY_CARE_PROVIDER_SITE_OTHER): Payer: BLUE CROSS/BLUE SHIELD

## 2016-06-02 ENCOUNTER — Encounter: Payer: Self-pay | Admitting: Family

## 2016-06-02 ENCOUNTER — Telehealth: Payer: Self-pay | Admitting: Family

## 2016-06-02 VITALS — BP 110/86 | HR 71 | Temp 98.6°F | Resp 16 | Ht 70.5 in | Wt 226.8 lb

## 2016-06-02 DIAGNOSIS — M1990 Unspecified osteoarthritis, unspecified site: Secondary | ICD-10-CM

## 2016-06-02 DIAGNOSIS — D649 Anemia, unspecified: Secondary | ICD-10-CM

## 2016-06-02 DIAGNOSIS — Z0000 Encounter for general adult medical examination without abnormal findings: Secondary | ICD-10-CM

## 2016-06-02 DIAGNOSIS — Z808 Family history of malignant neoplasm of other organs or systems: Secondary | ICD-10-CM

## 2016-06-02 LAB — CBC WITH DIFFERENTIAL/PLATELET
Basophils Relative: 0.6 % (ref 0.0–3.0)
EOS ABS: 0.2 10*3/uL (ref 0.0–0.7)
Eosinophils Relative: 4.1 % (ref 0.0–5.0)
HCT: 37.3 % (ref 36.0–46.0)
HEMOGLOBIN: 11.8 g/dL — AB (ref 12.0–15.0)
LYMPHS ABS: 1.6 10*3/uL (ref 0.7–4.0)
Lymphocytes Relative: 29.4 % (ref 12.0–46.0)
MCHC: 31.6 g/dL (ref 30.0–36.0)
MCV: 71.7 fl — ABNORMAL LOW (ref 78.0–100.0)
MONO ABS: 0.3 10*3/uL (ref 0.1–1.0)
MONOS PCT: 6.1 % (ref 3.0–12.0)
NEUTROS PCT: 59.8 % (ref 43.0–77.0)
Neutro Abs: 3.3 10*3/uL (ref 1.4–7.7)
Platelets: 179 10*3/uL (ref 150.0–400.0)
RBC: 5.2 Mil/uL — AB (ref 3.87–5.11)
RDW: 16.1 % — ABNORMAL HIGH (ref 11.5–15.5)
WBC: 5.5 10*3/uL (ref 4.0–10.5)

## 2016-06-02 LAB — HEPATIC FUNCTION PANEL
ALK PHOS: 66 U/L (ref 39–117)
ALT: 19 U/L (ref 0–35)
AST: 17 U/L (ref 0–37)
Albumin: 3.8 g/dL (ref 3.5–5.2)
BILIRUBIN DIRECT: 0.1 mg/dL (ref 0.0–0.3)
BILIRUBIN TOTAL: 0.6 mg/dL (ref 0.2–1.2)
Total Protein: 6.4 g/dL (ref 6.0–8.3)

## 2016-06-02 LAB — URINALYSIS, ROUTINE W REFLEX MICROSCOPIC
Bilirubin Urine: NEGATIVE
HGB URINE DIPSTICK: NEGATIVE
Ketones, ur: NEGATIVE
Leukocytes, UA: NEGATIVE
Nitrite: NEGATIVE
RBC / HPF: NONE SEEN (ref 0–?)
SPECIFIC GRAVITY, URINE: 1.02 (ref 1.000–1.030)
TOTAL PROTEIN, URINE-UPE24: NEGATIVE
URINE GLUCOSE: NEGATIVE
Urobilinogen, UA: 0.2 (ref 0.0–1.0)
WBC, UA: NONE SEEN (ref 0–?)
pH: 6.5 (ref 5.0–8.0)

## 2016-06-02 LAB — BASIC METABOLIC PANEL
BUN: 14 mg/dL (ref 6–23)
CALCIUM: 9 mg/dL (ref 8.4–10.5)
CO2: 26 mEq/L (ref 19–32)
CREATININE: 0.65 mg/dL (ref 0.40–1.20)
Chloride: 108 mEq/L (ref 96–112)
GFR: 126.3 mL/min (ref >60.00)
GLUCOSE: 102 mg/dL — AB (ref 70–99)
Potassium: 4.5 mEq/L (ref 3.5–5.1)
SODIUM: 140 meq/L (ref 135–145)

## 2016-06-02 LAB — LIPID PANEL
CHOL/HDL RATIO: 3
Cholesterol: 128 mg/dL (ref 0–200)
HDL: 46 mg/dL (ref >39.00)
LDL CALC: 71 mg/dL (ref 0–99)
NonHDL: 81.75
Triglycerides: 53 mg/dL (ref 0.0–149.0)
VLDL: 10.6 mg/dL (ref 0.0–40.0)

## 2016-06-02 LAB — IBC PANEL
IRON: 44 ug/dL (ref 42–145)
SATURATION RATIOS: 13 % — AB (ref 20.0–50.0)
Transferrin: 241 mg/dL (ref 212.0–360.0)

## 2016-06-02 LAB — TSH: TSH: 1.68 u[IU]/mL (ref 0.35–4.50)

## 2016-06-02 MED ORDER — FAMOTIDINE 20 MG PO TABS: 20.0000 mg | ORAL_TABLET | Freq: Two times a day (BID) | ORAL | 3 refills | Status: DC

## 2016-06-02 NOTE — Assessment & Plan Note (Signed)
She is on diclofenac per ortho which she plans to stay on long term. Will rx pepcid for GI prophylaxis.

## 2016-06-02 NOTE — Progress Notes (Signed)
Subjective:    Patient ID: Tiffany Sharp, female    DOB: 25-May-1971, 45 y.o.   MRN: [id number]  HPI   [name] is a 45 yr old female who presents today for cpx.  Immunizations:  Tetanus up to date Diet: trying Wt Readings from Last 3 Encounters:  06/02/16 226 lb 12.8 oz (102.9 kg)  12/27/15 229 lb 3.2 oz (104 kg)  12/24/15 229 lb 3.2 oz (104 kg)  Exercise: walks (has bad knees) 15000 steps/day Pap Smear: 1/17 Mammogram: 09/23/15 Vision: up to date Dental: due   Review of Systems  Constitutional: Negative for unexpected weight change.  HENT:       Reports decline in her hearing, mother wears hearing aids  Reports increased congestion at night  Eyes: Negative for visual disturbance.  Respiratory: Negative for cough and shortness of breath.   Cardiovascular: Negative for chest pain and leg swelling.  Gastrointestinal: Negative for blood in stool, constipation and diarrhea.  Genitourinary: Negative for dysuria, frequency and hematuria.  Musculoskeletal:       Bilateral knee/left hip pain-diclofenac is helping  Skin: Negative for rash.  Neurological: Negative for headaches.  Hematological:       Reports mild cervical LAD which she attributes to sinus drainage  Psychiatric/Behavioral:       Denies depression/anxiety sxs   Past Medical History:  Diagnosis Date  . Abscess   . Breast lump    had biopsy, benign  . Depression    PPD - post partum depression  . Eclampsia   . Endometritis   . HELLP (hemolytic anemia/elev liver enzymes/low platelets in pregnancy)   . History of echocardiogram    Echo 2/17: EF 60-65%, normal wall motion, mild RVE, atrial septal aneurysm (findings consistent with increased left atrial pressure)  . HSV-2 (herpes simplex virus 2) infection   . IBS (irritable bowel syndrome)   . Knee instability   . Murmur    told this as a child - no specifict testing ever done  . Postpartum hemorrhage   . Pregnancy induced hypertension    no meds  outside of pregnancy  . [place] ([place]) [place]   started with an abscess right axilla     Social History   Social History  . Marital status: Married    Spouse name: N/A  . Number of children: N/A  . 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 Smoking status: Never Smoker  . Smokeless tobacco: Never Used  . Alcohol use No  . Drug use: No  . Sexual activity: Not Currently    Birth control/ protection: None   Other Topics Concern  . 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 BREAST BIOPSY  2013   benign  . DILATION AND CURETTAGE OF UTERUS    . DILATION AND EVACUATION N/A 09/01/2013   Procedure: DILATATION AND EVACUATION;  Surgeon: Delice Lesch, MD;  Location: [facility];  Service: Gynecology;  Laterality: N/A;  . INCISE AND [place]   under left arm    Family History  Problem Relation Age of Onset  .  Cancer Mother     rare form of melanoma (hands)  . Hypertension Father   . Heart disease Father     chf, no CAD  . Transient ischemic attack Father   . Heart attack Neg Hx     Allergies  Allergen Reactions  . Penicillins Hives  . Sulfa Antibiotics Hives    Current Outpatient Prescriptions on File Prior to Visit  Medication Sig Dispense Refill  . Amphetamine ER (DYANAVEL XR) 2.5 MG/ML SUER Take 2.5 mg by mouth daily.     Marland Kitchen co-enzyme Q-10 30 MG capsule Take 300 mg by mouth daily.    . Cyanocobalamin (B-12 PO) Take 1 each by mouth daily. Mix in drink    . diclofenac (VOLTAREN) 75 MG EC tablet Take 75 mg by mouth 2 (two) times daily as needed for moderate pain (PAIN).     Marland Kitchen DIGESTIVE ENZYMES PO Take 1 tablet by mouth daily.      Marland Kitchen docusate sodium (COLACE) 100 MG capsule  Take 100 mg by mouth daily.       No current facility-administered medications on file prior to visit.     BP 110/86   Pulse 71   Temp 98.6 F (37 C) (Oral)   Resp 16   Ht 5' 10.5" (1.791 m)   Wt 226 lb 12.8 oz (102.9 kg)   LMP 05/27/2016   SpO2 98% Comment: room air  BMI 32.08 kg/m       Objective:   Physical Exam  Physical Exam  Constitutional: She is oriented to person, place, and time. She appears well-developed and well-nourished. No distress.  HENT:  Head: Normocephalic and atraumatic.  Right Ear: Tympanic membrane and ear canal normal.  Left Ear: Tympanic membrane and ear canal normal.  Mouth/Throat: Oropharynx is clear and moist.  Eyes: Pupils are equal, round, and reactive to light. No scleral icterus.  Neck: Normal range of motion. No thyromegaly present.  Cardiovascular: Normal rate and regular rhythm.   No murmur heard. Pulmonary/Chest: Effort normal and breath sounds normal. No respiratory distress. He has no wheezes. She has no rales. She exhibits no tenderness.  Abdominal: Soft. Bowel sounds are normal. She exhibits no distension and no mass. There is no tenderness. There is no rebound and no guarding.  Musculoskeletal: She exhibits no edema.  Lymphadenopathy:    She has no cervical adenopathy.  Neurological: She is alert and oriented to person, place, and time. She has normal patellar reflexes. She exhibits normal muscle tone. Coordination normal.  Skin: Skin is warm and dry.  Psychiatric: She has a normal mood and affect. Her behavior is normal. Judgment and thought content normal.  Breasts: Examined lying Right: Without masses, retractions, discharge or axillary adenopathy.  Left: Without masses, retractions, discharge or axillary adenopathy.           Assessment & Plan:         Assessment & Plan:  Family history of Melanoma- will refer to dermatology for routine surveillance.  She declines audiology referral at this time.

## 2016-06-02 NOTE — Telephone Encounter (Signed)
Add on form faxed to lab  

## 2016-06-02 NOTE — Progress Notes (Signed)
Pre visit review using our clinic review tool, if applicable. No additional management support is needed unless otherwise documented below in the visit note. 

## 2016-06-02 NOTE — Telephone Encounter (Signed)
Could you please ask lab to add on serum iron and tibc? Dx anemia?

## 2016-06-02 NOTE — Patient Instructions (Signed)
Please complete lab work prior to leaving. Keep up the great work with walking. Continue to work on [employer], exercise and weight loss.

## 2016-06-02 NOTE — Assessment & Plan Note (Signed)
Discussed healthy diet, exercise, weight loss.  Pap/mammogram up to date.  Complete lab work prior to leaving.

## 2016-06-03 ENCOUNTER — Telehealth: Payer: Self-pay | Admitting: Family

## 2016-06-03 MED ORDER — MULTI-VITAMIN/MINERALS PO TABS: 1.0000 | ORAL_TABLET | Freq: Every day | ORAL | Status: DC

## 2016-06-03 NOTE — Telephone Encounter (Signed)
See mychart.  

## 2016-06-09 DIAGNOSIS — S139XXA Sprain of joints and ligaments of unspecified parts of neck, initial encounter: Secondary | ICD-10-CM | POA: Diagnosis not present

## 2016-06-16 ENCOUNTER — Ambulatory Visit: Payer: BLUE CROSS/BLUE SHIELD | Attending: Hospitalist | Admitting: Physical Therapy

## 2016-06-16 DIAGNOSIS — M542 Cervicalgia: Secondary | ICD-10-CM | POA: Diagnosis not present
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
## 2016-06-24 ENCOUNTER — Ambulatory Visit: Payer: BLUE CROSS/BLUE SHIELD

## 2016-06-24 DIAGNOSIS — M542 Cervicalgia: Secondary | ICD-10-CM | POA: Diagnosis not present

## 2016-06-24 NOTE — Therapy (Signed)
[facility] [address]  [place], [place], [address] Phone: [phone]   Fax:  [phone]  Physical Therapy Treatment  Patient Details  Name: Tiffany Sharp MRN: [id number] Date of Birth: 30-Jun-1971 Referring Provider: Lamonte Richer, MD  Encounter Date: 06/24/2016      PT End of Session - 06/24/16 1657    Visit Number 2   Number of Visits 8   Date for PT Re-Evaluation 07/17/16   PT Start Time 1402   PT Stop Time 1455   PT Time Calculation (min) 53 min   Activity Tolerance Patient tolerated treatment well   Behavior During Therapy [facility] for tasks assessed/performed      Past Medical History:  Diagnosis Date  . Abscess   . Breast lump    had biopsy, benign  . Depression    PPD - post partum depression  . Eclampsia   . Endometritis   . HELLP (hemolytic anemia/elev liver enzymes/low platelets in pregnancy)   . History of echocardiogram    Echo 2/17: EF 60-65%, normal wall motion, mild RVE, atrial septal aneurysm (findings consistent with increased left atrial pressure)  . HSV-2 (herpes simplex virus 2) infection   . IBS (irritable bowel syndrome)   . Knee instability   . Murmur    told this as a child - no specifict testing ever done  . Postpartum hemorrhage   . Pregnancy induced hypertension    no meds outside of pregnancy  . Toxic shock syndrome ([place]) 1997   started with an abscess right axilla    Past Surgical History:  Procedure Laterality Date  . BREAST BIOPSY  2013   benign  . DILATION AND CURETTAGE OF UTERUS    . DILATION AND EVACUATION N/A 09/01/2013   Procedure: DILATATION AND EVACUATION;  Surgeon: Delice Lesch, MD;  Location: [facility];  Service: Gynecology;  Laterality: N/A;  . INCISE AND [place]   under left arm    There were no vitals filed for this visit.      Subjective Assessment - 06/24/16 1407    Subjective Pt. reports her initial pain in the B neck is a 4/10  today.  Pt. reports she had a bad night last night and this made the pain worse.     Patient Stated Goals "some exercises to do at home so I can improve my condition"   Currently in Pain? Yes   Pain Score 4    Pain Location Neck   Pain Orientation Mid   Pain Descriptors / Indicators Aching   Pain Type Acute pain   Pain Onset 1 to 4 weeks ago   Pain Frequency Intermittent   Multiple Pain Sites No         Today's Treatment   Therex: UBE: level 1.0, 2 min forward/2 min back  HEP review: Chin tucks 10x5" Scapular retraction 10x5" Seated UT stretch x30" Seated LS stretch x30"  Manual: B medial scapular border STM R medial scap. border TPR B UT STM   Therex: Seated with 1/2 foam bolster along spine:        B shoulder ER x 10 reps B Scapular Retraction with green TB 3" x 10 rpes B scapular retraction with extension to neutral with red TB 3" 10 reps    Modalities: Moist heat to B UT/neck: supine, 12 min  E-stim IFC to B UT/neck: 80-150Hz , intensity to pt. tolerance, supine, 12 min  PT Long Term Goals - 06/24/16 1810      PT LONG TERM GOAL #1   Title Independent with HEP by 07/17/16   Status On-going     PT LONG TERM GOAL #2   Title B shoulder strength >/= 4+/5 w/o pain by 07/17/16   Status On-going     PT LONG TERM GOAL #3   Title B cervical rotation AROM WFL w/o pain to improve ease of checking blind spot while driving by [id number]   Status On-going     PT LONG TERM GOAL #4   Title Pt will report ability to work on laptop for >1 hr w/o limitation from neck pain by 07/17/16   Status On-going               Plan - 06/24/16 1657    Clinical Impression Statement Pt. reports her initial pain in the B neck is a 4/10 today.  Pt. reports she had a bad night last night and this made the pain worse.  Pt. with frequent 4/10 neck pain reported throughout therex.  HEP review and scapular strengthening activity were focus of today treatment; pt. tolerated  these somewhat well however with increased neck pain following.  E-stim. And moist heat applied to neck and B UT following therex today however limited pain relief reported with this.     PT Treatment/Interventions Patient/family education;Therapeutic exercise;Neuromuscular re-education;Manual techniques;Taping;Dry needling;Moist Heat;Electrical Stimulation;Ultrasound;Iontophoresis 4mg /ml Dexamethasone;Traction;ADLs/Self Care Home Management   PT Next Visit Plan Postural training; Scapular stablization; Cervical ROM; Manual therapy & modalities PRN      Patient will benefit from skilled therapeutic intervention in order to improve the following deficits and impairments:  Pain, Postural dysfunction, Impaired flexibility, Decreased range of motion, Decreased strength, Increased muscle spasms  Visit Diagnosis: Cervicalgia     Problem List Patient Active Problem List   Diagnosis Date Noted  . Preventative health care 06/02/2016  . Osteoarthritis 06/02/2016  . Hyperglycemia 06/05/2015  . ADHD (attention deficit hyperactivity disorder) 06/03/2015  . Hip pain 06/03/2015  . Overweight 06/03/2015  . Preterm delivery 09/02/2013  . GBS (group B Streptococcus carrier), +RV culture, currently pregnant 08/28/2013  . Thrombocytopenia, unspecified ([place]) 08/28/2013  . HSV (herpes simplex virus) infection 08/25/2013  . H/O HELLP syndrome, currently pregnant 08/25/2013  . H/O PPH (postpartum hemorrhage) 08/25/2013  . Anemia 08/25/2013  . Breast lump 08/25/2013  . Allergy to penicillin 08/25/2013  . Rubella non-immune status, antepartum 08/25/2013  . History of postpartum depression 08/25/2013  . History of eclampsia 08/25/2013  . Advanced maternal age (AMA), 40 years or greater 08/25/2013  . Obesity, unspecified 08/25/2013  . History of toxic shock syndrome 08/25/2013  . History of loop electrical excision procedure (LEEP) 08/25/2013  . H/O LGA (large for gestational age) fetus 08/25/2013  .  Preterm premature rupture of membranes in third trimester 08/25/2013  . Breast asymmetry in female 10/12/2012    Bess Harvest, PTA 06/24/2016, 6:10 PM  [facility] [address]  [place], [place], [address] Phone: [phone]   Fax:  [phone]  Name: Tiffany Sharp MRN: [id number] Date of Birth: [birth year]

## 2016-06-25 ENCOUNTER — Ambulatory Visit: Payer: BLUE CROSS/BLUE SHIELD | Admitting: Physical Therapy

## 2016-06-25 DIAGNOSIS — M542 Cervicalgia: Secondary | ICD-10-CM
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
## 2016-06-29 ENCOUNTER — Ambulatory Visit: Payer: BLUE CROSS/BLUE SHIELD | Admitting: Physical Therapy

## 2016-06-29 DIAGNOSIS — M542 Cervicalgia: Secondary | ICD-10-CM

## 2016-06-29 NOTE — Therapy (Signed)
[facility] [address]  [place], [place], [address] Phone: [phone]   Fax:  [phone]  Physical Therapy Treatment  Patient Details  Name: Tiffany Sharp MRN: [id number] Date of Birth: 03-24-1971 Referring Provider: Lamonte Richer, MD  Encounter Date: 06/29/2016      PT End of Session - 06/29/16 0937    Visit Number 4   Number of Visits 8   Date for PT Re-Evaluation 07/17/16   PT Start Time 0937   PT Stop Time 1016   PT Time Calculation (min) 39 min   Activity Tolerance Patient tolerated treatment well   Behavior During Therapy [facility] for tasks assessed/performed      Past Medical History:  Diagnosis Date  . Abscess   . Breast lump    had biopsy, benign  . Depression    PPD - post partum depression  . Eclampsia   . Endometritis   . HELLP (hemolytic anemia/elev liver enzymes/low platelets in pregnancy)   . History of echocardiogram    Echo 2/17: EF 60-65%, normal wall motion, mild RVE, atrial septal aneurysm (findings consistent with increased left atrial pressure)  . HSV-2 (herpes simplex virus 2) infection   . IBS (irritable bowel syndrome)   . Knee instability   . Murmur    told this as a child - no specifict testing ever done  . Postpartum hemorrhage   . Pregnancy induced hypertension    no meds outside of pregnancy  . Toxic shock syndrome ([place]) 1997   started with an abscess right axilla    Past Surgical History:  Procedure Laterality Date  . BREAST BIOPSY  2013   benign  . DILATION AND CURETTAGE OF UTERUS    . DILATION AND EVACUATION N/A 09/01/2013   Procedure: DILATATION AND EVACUATION;  Surgeon: Delice Lesch, MD;  Location: [facility];  Service: Gynecology;  Laterality: N/A;  . INCISE AND [place]   under left arm    There were no vitals filed for this visit.      Subjective Assessment - 06/29/16 0937    Subjective Pt denies pain today, reporting just tightness.   Patient Stated Goals "some exercises to do at home so I can improve my condition"   Currently in Pain? No/denies   Pain Onset 1 to 4 weeks ago           Today's Treatment  TherEx UBE: level 2.0 fwd/back x 2' each B Shoulder Row with blue TB 20x3" B Scapular Retraction + Shoulder Extension to neutral with green TB 20x3" (attempted with blue TB but resistance too great) Doorway Pec stretch - low/mid/high x30" each Standing against 1/2 FR on wall:    B Shoulder Horiz ABD with green TB 15x3"    B Shoulder ER with green TB 15x3"    Alt Shoulder flexion + Opp Shoulder extension with green TB 15x3' Prone over green (65cm) Pball:    I x10    T x10    W x10    Y x10 BATCA Low Row 20# x15 Wall Push-up x10           PT Education - 06/29/16 1017    Education provided Yes   Education Details Prone Scapular retraction exercises; Shoulder rows advanced to blue TB; Standing 1/2 FR exercises to be completed standing in doorframe   Person(s) Educated Patient   Methods Explanation;Demonstration;Handout   Comprehension Verbalized understanding;Returned demonstration  PT Long Term Goals - 06/24/16 1810      PT LONG TERM GOAL #1   Title Independent with HEP by 07/17/16   Status On-going     PT LONG TERM GOAL #2   Title B shoulder strength >/= 4+/5 w/o pain by 07/17/16   Status On-going     PT LONG TERM GOAL #3   Title B cervical rotation AROM WFL w/o pain to improve ease of checking blind spot while driving by [id number]   Status On-going     PT LONG TERM GOAL #4   Title Pt will report ability to work on laptop for >1 hr w/o limitation from neck pain by 07/17/16   Status On-going               Plan - 06/29/16 1017    Clinical Impression Statement Pt noting improvement with PT, with decreasing pain and mostly just stiffness lately. Pt reports improving awareness of posture but admits to continued difficulty maintaining good posture while working from home  on computer. Pt expressing concerns about the cost of PT after Sept 1 as her deductible will start over, therefore discussed option to try transitioning to HEP with 30 day hold. Pt in agreement with this plan, therefore will plan to assess goal status and review/update HEP at next visit (some updates completed today) and assess appropriatenss to transition to HEP.   PT Treatment/Interventions Patient/family education;Therapeutic exercise;Neuromuscular re-education;Manual techniques;Taping;Dry needling;Moist Heat;Electrical Stimulation;Ultrasound;Iontophoresis 4mg /ml Dexamethasone;Traction;ADLs/Self Care Home Management   PT Next Visit Plan ROM/MMT/Goal assessment; Review/update HEP PRN for probable transition to HEP; Postural training; Scapular stablization; Cervical ROM; Manual therapy & modalities PRN   Consulted and Agree with Plan of Care Patient      Patient will benefit from skilled therapeutic intervention in order to improve the following deficits and impairments:  Pain, Postural dysfunction, Impaired flexibility, Decreased range of motion, Decreased strength, Increased muscle spasms  Visit Diagnosis: Cervicalgia     Problem List Patient Active Problem List   Diagnosis Date Noted  . Preventative health care 06/02/2016  . Osteoarthritis 06/02/2016  . Hyperglycemia 06/05/2015  . ADHD (attention deficit hyperactivity disorder) 06/03/2015  . Hip pain 06/03/2015  . Overweight 06/03/2015  . Preterm delivery 09/02/2013  . GBS (group B Streptococcus carrier), +RV culture, currently pregnant 08/28/2013  . Thrombocytopenia, unspecified ([place]) 08/28/2013  . HSV (herpes simplex virus) infection 08/25/2013  . H/O HELLP syndrome, currently pregnant 08/25/2013  . H/O PPH (postpartum hemorrhage) 08/25/2013  . Anemia 08/25/2013  . Breast lump 08/25/2013  . Allergy to penicillin 08/25/2013  . Rubella non-immune status, antepartum 08/25/2013  . History of postpartum depression 08/25/2013  .  History of eclampsia 08/25/2013  . Advanced maternal age (AMA), 40 years or greater 08/25/2013  . Obesity, unspecified 08/25/2013  . History of toxic shock syndrome 08/25/2013  . History of loop electrical excision procedure (LEEP) 08/25/2013  . H/O LGA (large for gestational age) fetus 08/25/2013  . Preterm premature rupture of membranes in third trimester 08/25/2013  . Breast asymmetry in female 10/12/2012    Percival Spanish, PT, MPT 06/29/2016, 10:31 AM  [facility] [address]  [place], [place], [address] Phone: [phone]   Fax:  [phone]  Name: Tiffany Sharp MRN: [id number] Date of Birth: 1971-10-31

## 2016-07-01 ENCOUNTER — Ambulatory Visit: Payer: BLUE CROSS/BLUE SHIELD | Admitting: Physical Therapy

## 2016-07-01 DIAGNOSIS — M542 Cervicalgia: Secondary | ICD-10-CM

## 2016-07-01 NOTE — Therapy (Addendum)
[facility] [address]  [place], [place], [address] Phone: [phone]   Fax:  [phone]  Physical Therapy Treatment  Patient Details  Name: Tiffany Sharp MRN: [id number] Date of Birth: 09-May-1971 Referring Provider: Lamonte Richer, MD  Encounter Date: 07/01/2016      PT End of Session - 07/01/16 1019    Visit Number 5   Number of Visits 8   Date for PT Re-Evaluation 07/17/16   PT Start Time 1019   PT Stop Time 1046   PT Time Calculation (min) 27 min   Activity Tolerance Patient tolerated treatment well   Behavior During Therapy [facility] for tasks assessed/performed      Past Medical History:  Diagnosis Date  . Abscess   . Breast lump    had biopsy, benign  . Depression    PPD - post partum depression  . Eclampsia   . Endometritis   . HELLP (hemolytic anemia/elev liver enzymes/low platelets in pregnancy)   . History of echocardiogram    Echo 2/17: EF 60-65%, normal wall motion, mild RVE, atrial septal aneurysm (findings consistent with increased left atrial pressure)  . HSV-2 (herpes simplex virus 2) infection   . IBS (irritable bowel syndrome)   . Knee instability   . Murmur    told this as a child - no specifict testing ever done  . Postpartum hemorrhage   . Pregnancy induced hypertension    no meds outside of pregnancy  . Toxic shock syndrome ([place]) 1997   started with an abscess right axilla    Past Surgical History:  Procedure Laterality Date  . BREAST BIOPSY  2013   benign  . DILATION AND CURETTAGE OF UTERUS    . DILATION AND EVACUATION N/A 09/01/2013   Procedure: DILATATION AND EVACUATION;  Surgeon: Delice Lesch, MD;  Location: [facility];  Service: Gynecology;  Laterality: N/A;  . INCISE AND [place]   under left arm    There were no vitals filed for this visit.      Subjective Assessment - 07/01/16 1019    Subjective Pt would like today to be her last day of PT. No new  c/o or concerns.   Patient Stated Goals "some exercises to do at home so I can improve my condition"   Currently in Pain? No/denies   Pain Onset 1 to 4 weeks ago            [facility] PT Assessment - 07/01/16 1019      Assessment   Medical Diagnosis Cervical strain   Onset Date/Surgical Date 05/19/16   Hand Dominance Right   Next MD Visit none scheduled     Observation/Other Assessments   Focus on Therapeutic Outcomes (FOTO)  Neck - 66% (34% limitation)     AROM   AROM Assessment Site Cervical   Cervical Flexion 50   Cervical Extension 57   Cervical - Right Side Bend 41   Cervical - Left Side Bend 39   Cervical - Right Rotation 68   Cervical - Left Rotation 58     Strength   Strength Assessment Site Shoulder   Right Shoulder Flexion 4+/5   Right Shoulder ABduction 4+/5   Right Shoulder Internal Rotation 4+/5   Right Shoulder External Rotation 4+/5   Left Shoulder Flexion 4+/5   Left Shoulder ABduction 4+/5   Left Shoulder Internal Rotation 4+/5   Left Shoulder External Rotation 4+/5  Today's Treatment  TherEx UBE -  level 3.0 fwd/back x 2' each Seated UT stretch x30" Seated LS stretch x30"  Review of postural education, esp at home laptop work station  Overview of recommended continued HEP           PT Long Term Goals - 07/01/16 1033      PT LONG TERM GOAL #1   Title Independent with HEP by 07/17/16   Status Achieved     PT LONG TERM GOAL #2   Title B shoulder strength >/= 4+/5 w/o pain by 07/17/16   Status Achieved     PT LONG TERM GOAL #3   Title B cervical rotation AROM WFL w/o pain to improve ease of checking blind spot while driving by 6/54/65   Status Achieved     PT LONG TERM GOAL #4   Title Pt will report ability to work on laptop for >1 hr w/o limitation from neck pain by 07/17/16   Status Achieved  Pt still notes sensation of neck "strain" but no pain               Plan - 07/01/16 1043    Clinical Impression  Statement Pt has demostrated excellent progress with PT. No recent pain reported, although does continue to note some stiffness, esp when working at computer, but continues to report having to consciously think about posture. Cervical ROM now [facility] in all planes with most significant improvement noted in B rotation with pt now reporting increased ease checking blindspot while driving. B shoulder strength now symmetrical at 4+/5. All goals met for this episode and pt would like to try transitioning to HEP, therefore pt placed on hold for 30 days in the event that issues arise with HEP. If no need to return within 30 days, will proceed with discharge.   PT Treatment/Interventions Patient/family education;Therapeutic exercise;Neuromuscular re-education;Manual techniques;Taping;Dry needling;Moist Heat;Electrical Stimulation;Ultrasound;Iontophoresis 12m/ml Dexamethasone;Traction;ADLs/Self Care Home Management   PT Next Visit Plan 30 day hold   Consulted and Agree with Plan of Care Patient      Patient will benefit from skilled therapeutic intervention in order to improve the following deficits and impairments:  Pain, Postural dysfunction, Impaired flexibility, Decreased range of motion, Decreased strength, Increased muscle spasms  Visit Diagnosis: Cervicalgia     Problem List Patient Active Problem List   Diagnosis Date Noted  . Preventative health care 06/02/2016  . Osteoarthritis 06/02/2016  . Hyperglycemia 06/05/2015  . ADHD (attention deficit hyperactivity disorder) 06/03/2015  . Hip pain 06/03/2015  . Overweight 06/03/2015  . Preterm delivery 09/02/2013  . GBS (group B Streptococcus carrier), +RV culture, currently pregnant 08/28/2013  . Thrombocytopenia, unspecified ([place] 08/28/2013  . HSV (herpes simplex virus) infection 08/25/2013  . H/O HELLP syndrome, currently pregnant 08/25/2013  . H/O PPH (postpartum hemorrhage) 08/25/2013  . Anemia 08/25/2013  . Breast lump 08/25/2013  .  Allergy to penicillin 08/25/2013  . Rubella non-immune status, antepartum 08/25/2013  . History of postpartum depression 08/25/2013  . History of eclampsia 08/25/2013  . Advanced maternal age (AMA), 40 years or greater 08/25/2013  . Obesity, unspecified 08/25/2013  . History of toxic shock syndrome 08/25/2013  . History of loop electrical excision procedure (LEEP) 08/25/2013  . H/O LGA (large for gestational age) fetus 08/25/2013  . Preterm premature rupture of membranes in third trimester 08/25/2013  . Breast asymmetry in female 10/12/2012    JPercival Spanish PT, MPT 07/01/2016, 10:54 AM  [facility] [phone]  [address]  [place], [place], [address] Phone: [phone]   Fax:  [phone]  Name: Tiffany Sharp MRN: [id number] Date of Birth: 01-01-1971  PHYSICAL THERAPY DISCHARGE SUMMARY  Visits from Start of Care: 5  Current functional level related to goals / functional outcomes:   Refer to above clinical impression for status as of last PT visit on 07/01/16. Pt was placed on 30 day hold and has not needed to return in >30 days, therefore will proceed with D/C from PT with all goals met.   Remaining deficits:   As above.   Education / Equipment:   HEP, postural education Plan: Patient agrees to discharge.  Patient goals were met. Patient is being discharged due to meeting the stated rehab goals.  ?????    Percival Spanish, PT, MPT 08/17/16, 4:20 PM  [facility] [address]  [place], [place], [address] Phone: [phone]   Fax:  [phone]

## 2016-07-02 ENCOUNTER — Encounter: Payer: BLUE CROSS/BLUE SHIELD | Admitting: Physical Therapy

## 2016-07-02 DIAGNOSIS — F902 Attention-deficit hyperactivity disorder, combined type: Secondary | ICD-10-CM | POA: Diagnosis not present

## 2016-07-03 ENCOUNTER — Encounter: Payer: BLUE CROSS/BLUE SHIELD | Admitting: Family

## 2016-07-08 DIAGNOSIS — N760 Acute vaginitis: Secondary | ICD-10-CM | POA: Diagnosis not present

## 2016-08-11 DIAGNOSIS — F902 Attention-deficit hyperactivity disorder, combined type: Secondary | ICD-10-CM | POA: Diagnosis not present

## 2016-09-11 DIAGNOSIS — Z23 Encounter for immunization: Secondary | ICD-10-CM | POA: Diagnosis not present

## 2016-09-28 ENCOUNTER — Emergency Department (HOSPITAL_BASED_OUTPATIENT_CLINIC_OR_DEPARTMENT_OTHER)
Admission: EM | Admit: 2016-09-28 | Discharge: 2016-09-28 | Disposition: A | Discharge status: Home / Self Care | Payer: BLUE CROSS/BLUE SHIELD | Attending: Emergency Medicine | Admitting: Emergency Medicine

## 2016-09-28 ENCOUNTER — Telehealth: Payer: Self-pay | Admitting: Family

## 2016-09-28 ENCOUNTER — Emergency Department (HOSPITAL_BASED_OUTPATIENT_CLINIC_OR_DEPARTMENT_OTHER): Payer: BLUE CROSS/BLUE SHIELD

## 2016-09-28 ENCOUNTER — Encounter (HOSPITAL_BASED_OUTPATIENT_CLINIC_OR_DEPARTMENT_OTHER): Payer: Self-pay | Admitting: Emergency Medicine

## 2016-09-28 DIAGNOSIS — F909 Attention-deficit hyperactivity disorder, unspecified type: Secondary | ICD-10-CM | POA: Insufficient documentation

## 2016-09-28 DIAGNOSIS — R079 Chest pain, unspecified: Secondary | ICD-10-CM | POA: Diagnosis not present

## 2016-09-28 DIAGNOSIS — Z79899 Other long term (current) drug therapy: Secondary | ICD-10-CM | POA: Insufficient documentation

## 2016-09-28 DIAGNOSIS — R0789 Other chest pain: Secondary | ICD-10-CM | POA: Diagnosis present

## 2016-09-28 LAB — CBC
HEMATOCRIT: 36.6 % (ref 36.0–46.0)
Hemoglobin: 11.9 g/dL — ABNORMAL LOW (ref 12.0–15.0)
MCH: 22.9 pg — ABNORMAL LOW (ref 26.0–34.0)
MCHC: 32.5 g/dL (ref 30.0–36.0)
MCV: 70.4 fL — ABNORMAL LOW (ref 78.0–100.0)
Platelets: 155 10*3/uL (ref 150–400)
RBC: 5.2 MIL/uL — ABNORMAL HIGH (ref 3.87–5.11)
RDW: 15.7 % — AB (ref 11.5–15.5)
WBC: 8.4 10*3/uL (ref 4.0–10.5)

## 2016-09-28 LAB — BASIC METABOLIC PANEL
Anion gap: 6 (ref 5–15)
BUN: 17 mg/dL (ref 6–20)
CALCIUM: 8.3 mg/dL — AB (ref 8.9–10.3)
CO2: 22 mmol/L (ref 22–32)
Chloride: 110 mmol/L (ref 101–111)
Creatinine, Ser: 0.61 mg/dL (ref 0.44–1.00)
GFR calc Af Amer: >60 mL/min (ref >60)
GLUCOSE: 120 mg/dL — AB (ref 65–99)
Potassium: 3.9 mmol/L (ref 3.5–5.1)
Sodium: 138 mmol/L (ref 135–145)

## 2016-09-28 LAB — TROPONIN I

## 2016-09-28 LAB — D-DIMER, QUANTITATIVE: D-Dimer, Quant: 0.51 ug/mL-FEU — ABNORMAL HIGH (ref 0.00–0.50)

## 2016-09-28 MED ORDER — ALUM & MAG HYDROXIDE-SIMETH 200-200-20 MG/5ML PO SUSP
30.0000 mL | Freq: Once | ORAL | Status: AC
  Administered 2016-09-28: 30 mL via ORAL
  Filled 2016-09-28: qty 30

## 2016-09-28 NOTE — ED Triage Notes (Signed)
Patient reports that she has had some pain to her right shoulder for a "few" weeks. She reports that last night she started to notice that it was raditating to her chest and she was having some SOB with the shoulder tightness.

## 2016-09-28 NOTE — Telephone Encounter (Signed)
Per chart, pt went to the ER as advised.   

## 2016-09-28 NOTE — ED Provider Notes (Signed)
[facility] Provider Note   CSN: [id number] Arrival date & time: 09/28/16  1017     History   Chief Complaint Chief Complaint  Patient presents with  . Chest Pain    HPI Tiffany Sharp is a 45 y.o. female.  HPI Patient reports occasional right shoulder pain over the past several weeks he reports that last night she began having some radiation into her chest and she describes this as a shortness of breath.  No prior history of cardiac disease.  She denies a history of hypertension, hyperlipidemia, diabetes.  No family history of early cardiac disease.  She states her discomfort is been constant for the entire morning.  After speaking with her family recommended she come to the ER for evaluation.  No history DVT or pulmonary embolism.  No family history of VTE disease.  Does not worsen with position or movement.   Past Medical History:  Diagnosis Date  . Abscess   . Breast lump    had biopsy, benign  . Depression    PPD - post partum depression  . Eclampsia   . Endometritis   . HELLP (hemolytic anemia/elev liver enzymes/low platelets in pregnancy)   . History of echocardiogram    Echo 2/17: EF 60-65%, normal wall motion, mild RVE, atrial septal aneurysm (findings consistent with increased left atrial pressure)  . HSV-2 (herpes simplex virus 2) infection   . IBS (irritable bowel syndrome)   . Knee instability   . Murmur    told this as a child - no specifict testing ever done  . Postpartum hemorrhage   . Pregnancy induced hypertension    no meds outside of pregnancy  . Toxic shock syndrome ([place]) 1997   started with an abscess right axilla    Patient Active Problem List   Diagnosis Date Noted  . Preventative health care 06/02/2016  . Osteoarthritis 06/02/2016  . Hyperglycemia 06/05/2015  . ADHD (attention deficit hyperactivity disorder) 06/03/2015  . Hip pain 06/03/2015  . Overweight 06/03/2015  . Preterm delivery 09/02/2013  . GBS (group B  Streptococcus carrier), +RV culture, currently pregnant 08/28/2013  . Thrombocytopenia, unspecified 08/28/2013  . HSV (herpes simplex virus) infection 08/25/2013  . H/O HELLP syndrome, currently pregnant 08/25/2013  . H/O PPH (postpartum hemorrhage) 08/25/2013  . Anemia 08/25/2013  . Breast lump 08/25/2013  . Allergy to penicillin 08/25/2013  . Rubella non-immune status, antepartum 08/25/2013  . History of postpartum depression 08/25/2013  . History of eclampsia 08/25/2013  . Advanced maternal age (AMA), 40 years or greater 08/25/2013  . Obesity, unspecified 08/25/2013  . History of toxic shock syndrome 08/25/2013  . History of loop electrical excision procedure (LEEP) 08/25/2013  . H/O LGA (large for gestational age) fetus 08/25/2013  . Preterm premature rupture of membranes in third trimester 08/25/2013  . Breast asymmetry in female 10/12/2012    Past Surgical History:  Procedure Laterality Date  . BREAST BIOPSY  2013   benign  . DILATION AND CURETTAGE OF UTERUS    . DILATION AND EVACUATION N/A 09/01/2013   Procedure: DILATATION AND EVACUATION;  Surgeon: Delice Lesch, MD;  Location: [facility];  Service: Gynecology;  Laterality: N/A;  . INCISE AND DRAIN ABCESS  1996   under left arm    OB History    Gravida Para Term Preterm AB Living   8 5 4 1 3 5    SAB TAB Ectopic Multiple Live Births   3       5  Home Medications    Prior to Admission medications   Medication Sig Start Date End Date Taking? Authorizing Provider  Amphetamine ER (DYANAVEL XR) 2.5 MG/ML SUER Take 2.5 mg by mouth daily.  11/15/15   Historical Provider, MD  co-enzyme Q-10 30 MG capsule Take 300 mg by mouth daily.    Historical Provider, MD  Cyanocobalamin (B-12 PO) Take 1 each by mouth daily. Mix in drink    Historical Provider, MD  diclofenac (VOLTAREN) 75 MG EC tablet Take 75 mg by mouth 2 (two) times daily as needed for moderate pain (PAIN).  12/06/15   Historical Provider, MD  DIGESTIVE ENZYMES  PO Take 1 tablet by mouth daily.      Historical Provider, MD  docusate sodium (COLACE) 100 MG capsule Take 100 mg by mouth daily.      Historical Provider, MD  famotidine (PEPCID) 20 MG tablet Take 1 tablet (20 mg total) by mouth 2 (two) times daily. 06/02/16   Debbrah Alar, NP  lisdexamfetamine (VYVANSE) 20 MG capsule Take 20 mg by mouth daily with lunch.    Historical Provider, MD  Multiple Vitamins-Minerals (MULTIVITAMIN WITH MINERALS) tablet Take 1 tablet by mouth daily. 06/03/16   Debbrah Alar, NP    Family History Family History  Problem Relation Age of Onset  . Cancer Mother     rare form of melanoma (hands)  . Hypertension Father   . Heart disease Father     chf, no CAD  . Transient ischemic attack Father   . Heart attack Neg Hx     Social History Social History  Substance Use Topics  . Smoking status: Never Smoker  . Smokeless tobacco: Never Used  . Alcohol use No     Allergies   Penicillins and Sulfa antibiotics   Review of Systems Review of Systems  All other systems reviewed and are negative.    Physical Exam Updated Vital Signs BP 132/94   Pulse 72   Temp 98.3 F (36.8 C) (Oral)   Resp 16   Ht 5\' 11"  (1.803 m)   Wt 220 lb (99.8 kg)   LMP 09/07/2016   SpO2 100%   BMI 30.68 kg/m   Physical Exam  Constitutional: She is oriented to person, place, and time. She appears well-developed and well-nourished. No distress.  HENT:  Head: Normocephalic and atraumatic.  Eyes: EOM are normal.  Neck: Normal range of motion.  Cardiovascular: Normal rate, regular rhythm and normal heart sounds.   Pulmonary/Chest: Effort normal and breath sounds normal.  Abdominal: Soft. She exhibits no distension. There is no tenderness.  Musculoskeletal: Normal range of motion. She exhibits no edema.  Neurological: She is alert and oriented to person, place, and time.  Skin: Skin is warm and dry.  Psychiatric: She has a normal mood and affect. Judgment normal.    Nursing note and vitals reviewed.    ED Treatments / Results  Labs (all labs ordered are listed, but only abnormal results are displayed) Labs Reviewed  BASIC METABOLIC PANEL - Abnormal; Notable for the following:       Result Value   Glucose, Bld 120 (*)    Calcium 8.3 (*)    All other components within normal limits  CBC - Abnormal; Notable for the following:    RBC 5.20 (*)    Hemoglobin 11.9 (*)    MCV 70.4 (*)    MCH 22.9 (*)    RDW 15.7 (*)    All other components within normal limits  D-DIMER, QUANTITATIVE (NOT AT [facility]) - Abnormal; Notable for the following:    D-Dimer, Quant 0.51 (*)    All other components within normal limits  TROPONIN I    EKG  EKG Interpretation  Date/Time:  Monday September 28 2016 10:27:41 EST Ventricular Rate:  78 PR Interval:    QRS Duration: 96 QT Interval:  402 QTC Calculation: 458 R Axis:   38 Text Interpretation:  Sinus rhythm No old tracing to compare Confirmed by Lahna Nath  MD, Lennette Bihari ([id number]) on 09/28/2016 10:33:09 AM       Radiology Dg Chest 2 View  Result Date: 09/28/2016 CLINICAL DATA:  Chest pain. EXAM: CHEST  2 VIEW COMPARISON:  CT 12/30/2015. FINDINGS: Mediastinum and hilar structures normal. Lungs are clear. No pleural effusion or pneumothorax. Heart size normal. IMPRESSION: No acute cardiopulmonary disease. Electronically Signed   By: Marcello Moores  Register   On: 09/28/2016 10:56    Procedures Procedures (including critical care time)  Medications Ordered in ED Medications  alum & mag hydroxide-simeth (MAALOX/MYLANTA) 200-200-20 MG/5ML suspension 30 mL (30 mLs Oral Given 09/28/16 1207)     Initial Impression / Assessment and Plan / ED Course  I have reviewed the triage vital signs and the nursing notes.  Pertinent labs & imaging results that were available during my care of the patient were reviewed by me and considered in my medical decision making (see chart for details).  Clinical Course     Nonspecific  symptoms.  Doubt ACS.  Doubt PE.  Workup without significant abnormality.  Primary care and outpatient cardiology follow-up.  She understands return the ER for new or worsening symptoms.  She could undergo stress testing.  Nonspecific chest pain.  May represent gastroesophageal reflux disease.  Final Clinical Impressions(s) / ED Diagnoses   Final diagnoses:  Chest pain, unspecified type    New Prescriptions New Prescriptions   No medications on file     Jola Schmidt, MD 09/28/16 1247

## 2016-09-28 NOTE — Telephone Encounter (Signed)
Patient Name: Tiffany Sharp  DOB: Nov 14, 1970    Initial Comment Caller says, chest pain    Nurse Assessment  Nurse: Leilani Merl, RN, [place] Date/Time (Eastern Time): 09/28/2016 9:41:38 AM  Confirm and document reason for call. If symptomatic, describe symptoms. You must click the next button to save text entered. ---Caller states that she has been having pain in her right shoulder for a couple of weeks that this morning has moved to her chest over the last 3-4 days.  Does the patient have any new or worsening symptoms? ---Yes  Will a triage be completed? ---Yes  Related visit to physician within the last 2 weeks? ---No  Does the PT have any chronic conditions? (i.e. diabetes, asthma, etc.) ---Yes  List chronic conditions. ---See MR  Is the patient pregnant or possibly pregnant? (Ask all females between the ages of 84-55) ---No  Is this a behavioral health or substance abuse call? ---No     Guidelines    Guideline Title Affirmed Question Affirmed Notes  Chest Pain Pain also present in shoulder(s) or arm(s) or jaw (Exception: pain is clearly made worse by movement)    Final Disposition User   Go to ED Now [place], RN, [place]    Referrals  [facility] - ED   Disagree/Comply: Comply

## 2016-10-05 DIAGNOSIS — Z1231 Encounter for screening mammogram for malignant neoplasm of breast: Secondary | ICD-10-CM | POA: Diagnosis not present

## 2016-11-02 LAB — HM PAP SMEAR: HM Pap smear: NORMAL

## 2016-11-02 LAB — HM MAMMOGRAPHY: HM MAMMO: NORMAL (ref 0–4)

## 2017-01-20 DIAGNOSIS — H524 Presbyopia: Secondary | ICD-10-CM | POA: Diagnosis not present

## 2017-02-22 DIAGNOSIS — Z124 Encounter for screening for malignant neoplasm of cervix: Secondary | ICD-10-CM | POA: Diagnosis not present

## 2017-02-22 DIAGNOSIS — Z01419 Encounter for gynecological examination (general) (routine) without abnormal findings: Secondary | ICD-10-CM | POA: Diagnosis not present

## 2017-02-22 DIAGNOSIS — Z6831 Body mass index (BMI) 31.0-31.9, adult: Secondary | ICD-10-CM | POA: Diagnosis not present

## 2017-02-22 DIAGNOSIS — R87618 Other abnormal cytological findings on specimens from cervix uteri: Secondary | ICD-10-CM | POA: Diagnosis not present

## 2017-02-22 LAB — HM PAP SMEAR: HM Pap smear: NEGATIVE

## 2017-09-08 DIAGNOSIS — M1612 Unilateral primary osteoarthritis, left hip: Secondary | ICD-10-CM | POA: Diagnosis not present

## 2017-09-08 DIAGNOSIS — M170 Bilateral primary osteoarthritis of knee: Secondary | ICD-10-CM | POA: Diagnosis not present

## 2017-10-08 ENCOUNTER — Ambulatory Visit (INDEPENDENT_AMBULATORY_CARE_PROVIDER_SITE_OTHER): Payer: BLUE CROSS/BLUE SHIELD | Admitting: Family

## 2017-10-08 ENCOUNTER — Encounter: Payer: Self-pay | Admitting: Family

## 2017-10-08 VITALS — BP 109/70 | HR 69 | Temp 98.5°F | Resp 16 | Ht 70.0 in | Wt 221.0 lb

## 2017-10-08 DIAGNOSIS — Z0001 Encounter for general adult medical examination with abnormal findings: Secondary | ICD-10-CM

## 2017-10-08 DIAGNOSIS — R0789 Other chest pain: Secondary | ICD-10-CM | POA: Diagnosis not present

## 2017-10-08 DIAGNOSIS — R51 Headache: Secondary | ICD-10-CM

## 2017-10-08 DIAGNOSIS — Z0000 Encounter for general adult medical examination without abnormal findings: Secondary | ICD-10-CM

## 2017-10-08 DIAGNOSIS — R519 Headache, unspecified: Secondary | ICD-10-CM

## 2017-10-08 LAB — URINALYSIS, ROUTINE W REFLEX MICROSCOPIC
HGB URINE DIPSTICK: NEGATIVE
KETONES UR: NEGATIVE
Leukocytes, UA: NEGATIVE
NITRITE: NEGATIVE
SPECIFIC GRAVITY, URINE: 1.02 (ref 1.000–1.030)
TOTAL PROTEIN, URINE-UPE24: NEGATIVE
URINE GLUCOSE: NEGATIVE
UROBILINOGEN UA: 0.2 (ref 0.0–1.0)
pH: 6.5 (ref 5.0–8.0)

## 2017-10-08 LAB — CBC WITH DIFFERENTIAL/PLATELET
BASOS PCT: 0.4 % (ref 0.0–3.0)
Basophils Absolute: 0 10*3/uL (ref 0.0–0.1)
EOS PCT: 2.1 % (ref 0.0–5.0)
Eosinophils Absolute: 0.1 10*3/uL (ref 0.0–0.7)
HEMATOCRIT: 41.8 % (ref 36.0–46.0)
HEMOGLOBIN: 13.2 g/dL (ref 12.0–15.0)
LYMPHS PCT: 25.9 % (ref 12.0–46.0)
Lymphs Abs: 1.6 10*3/uL (ref 0.7–4.0)
MCHC: 31.7 g/dL (ref 30.0–36.0)
MCV: 74 fl — AB (ref 78.0–100.0)
MONO ABS: 0.5 10*3/uL (ref 0.1–1.0)
Monocytes Relative: 8 % (ref 3.0–12.0)
Neutro Abs: 3.9 10*3/uL (ref 1.4–7.7)
Neutrophils Relative %: 63.6 % (ref 43.0–77.0)
Platelets: 181 10*3/uL (ref 150.0–400.0)
RBC: 5.65 Mil/uL — ABNORMAL HIGH (ref 3.87–5.11)
RDW: 16 % — AB (ref 11.5–15.5)
WBC: 6.2 10*3/uL (ref 4.0–10.5)

## 2017-10-08 LAB — BASIC METABOLIC PANEL
BUN: 13 mg/dL (ref 6–23)
CHLORIDE: 104 meq/L (ref 96–112)
CO2: 29 mEq/L (ref 19–32)
Calcium: 9 mg/dL (ref 8.4–10.5)
Creatinine, Ser: 0.73 mg/dL (ref 0.40–1.20)
GFR: 109.82 mL/min (ref >60.00)
Glucose, Bld: 99 mg/dL (ref 70–99)
POTASSIUM: 4.3 meq/L (ref 3.5–5.1)
Sodium: 139 mEq/L (ref 135–145)

## 2017-10-08 LAB — LIPID PANEL
CHOLESTEROL: 121 mg/dL (ref 0–200)
HDL: 46.2 mg/dL (ref >39.00)
LDL Cholesterol: 65 mg/dL (ref 0–99)
NonHDL: 75.01
TRIGLYCERIDES: 48 mg/dL (ref 0.0–149.0)
Total CHOL/HDL Ratio: 3
VLDL: 9.6 mg/dL (ref 0.0–40.0)

## 2017-10-08 LAB — TSH: TSH: 1.67 u[IU]/mL (ref 0.35–4.50)

## 2017-10-08 LAB — HEPATIC FUNCTION PANEL
ALBUMIN: 4.1 g/dL (ref 3.5–5.2)
ALT: 15 U/L (ref 0–35)
AST: 15 U/L (ref 0–37)
Alkaline Phosphatase: 80 U/L (ref 39–117)
Bilirubin, Direct: 0.2 mg/dL (ref 0.0–0.3)
TOTAL PROTEIN: 6.6 g/dL (ref 6.0–8.3)
Total Bilirubin: 0.9 mg/dL (ref 0.2–1.2)

## 2017-10-08 NOTE — Progress Notes (Signed)
Subjective:    Patient ID: Tiffany Sharp, female    DOB: Oct 29, 1971, 46 y.o.   MRN: [id number]  HPI  Patient presents today for complete physical.  Immunizations: tdap 2014 flu shot 09/02/17 Diet: reports diet is healthy Exercise: walks 15000 steps a day Pap Smear: 1/18 Mammogram: 1/18  Dental: due Vision: up to date Wt Readings from Last 3 Encounters:  10/08/17 221 lb (100.2 kg)  09/28/16 220 lb (99.8 kg)  06/02/16 226 lb 12.8 oz (102.9 kg)   Headaches- reports that she has had HA's almost every day x 1 month. Usually she does not take anything for it.  describes a fontal and occipital pressure.  Denies nausea/light sensitivity or phonophobia. Tylenol helps. Not sleeping well.  Drinks energy drinks.   B12/green tea extracts (2-3 a day)-usually drinks these in the afternoon.  Atypical chest pain- occurs at rest.    Review of Systems  Constitutional: Negative for unexpected weight change.  HENT: Negative for rhinorrhea and sinus pressure.        Hearing "not great" declines audiology  Eyes: Negative for visual disturbance.  Respiratory: Negative for cough.   Cardiovascular: Negative for leg swelling.  Gastrointestinal: Negative for blood in stool, constipation and diarrhea.  Genitourinary: Negative for dysuria, frequency, hematuria and menstrual problem.  Musculoskeletal: Negative for myalgias.       Some knee pain/hip pain- seeing ortho (Dr. Lynann Bologna)  Skin: Negative for rash.  Hematological: Negative for adenopathy.  Psychiatric/Behavioral:       Denies depression/anxiety       Past Medical History:  Diagnosis Date  . Abscess   . Breast lump    had biopsy, benign  . Depression    PPD - post partum depression  . Eclampsia   . Endometritis   . HELLP (hemolytic anemia/elev liver enzymes/low platelets in pregnancy)   . History of echocardiogram    Echo 2/17: EF 60-65%, normal wall motion, mild RVE, atrial septal aneurysm (findings consistent with increased left  atrial pressure)  . HSV-2 (herpes simplex virus 2) infection   . IBS (irritable bowel syndrome)   . Knee instability   . Murmur    told this as a child - no specifict testing ever done  . Postpartum hemorrhage   . Pregnancy induced hypertension    no meds outside of pregnancy  . Toxic shock syndrome ([place]) 1997   started with an abscess right axilla     Social History   Socioeconomic History  . Marital status: Married    Spouse name: Not on file  . Number of children: Not on file  . Years of education: Not on file  . Highest education level: Not on file  Social Needs  . Financial resource strain: Not on file  . Food insecurity - worry: Not on file  . Food insecurity - inability: Not on file  . Transportation needs - medical: Not on file  . Transportation needs - non-medical: Not on file  Occupational History  . 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 Smoking status: Never Smoker  . Smokeless tobacco: Never Used  Substance and Sexual Activity  . Alcohol use: No  . Drug use: No  . Sexual activity: Not Currently    Birth control/protection: None  Other Topics Concern  . 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 BREAST BIOPSY  2013   benign  . DILATION AND CURETTAGE OF UTERUS    . DILATION AND EVACUATION N/A 09/01/2013   Procedure: DILATATION AND EVACUATION;  Surgeon: Delice Lesch, MD;  Location: [facility];  Service: Gynecology;  Laterality: N/A;  . INCISE AND [place]   under left arm    Family History  Problem Relation Age of Onset  . Cancer Mother        rare form of melanoma (hands)  . Hypertension Father   . Heart disease Father        chf, no CAD  . Transient ischemic attack Father   . Heart attack Neg Hx     Allergies    Allergen Reactions  . Penicillins Hives  . Sulfa Antibiotics Hives    Current Outpatient Medications on File Prior to Visit  Medication Sig Dispense Refill  . Amphetamine ER (DYANAVEL XR) 2.5 MG/ML SUER Take 2.5 mg by mouth daily.     Marland Kitchen BORIC ACID EX Place 1 tablet vaginally as needed. After menstruation    . co-enzyme Q-10 30 MG capsule Take 300 mg by mouth daily.    . Cyanocobalamin (B-12 PO) Take 1 each by mouth daily. Mix in drink    . DIGESTIVE ENZYMES PO Take 1 tablet by mouth daily.      Marland Kitchen docusate sodium (COLACE) 100 MG capsule Take 100 mg by mouth daily.      Marland Kitchen etodolac (LODINE) 500 MG tablet Take 1 tablet by mouth 2 (two) times daily.  5  . famotidine (PEPCID) 20 MG tablet Take 1 tablet (20 mg total) by mouth 2 (two) times daily. 60 tablet 3  . lisdexamfetamine (VYVANSE) 20 MG capsule Take 20 mg by mouth daily with lunch.    . Multiple Vitamins-Minerals (MULTIVITAMIN WITH MINERALS) tablet Take 1 tablet by mouth daily.     No current facility-administered medications on file prior to visit.     BP 109/70 (BP Location: Left Arm, Cuff Size: Large)   Pulse 69   Temp 98.5 F (36.9 C) (Oral)   Resp 16   Ht 5\' 10"  (1.778 m)   Wt 221 lb (100.2 kg)   LMP 10/01/2017   SpO2 98%   BMI 31.71 kg/m    Objective:   Physical Exam  Physical Exam  Constitutional: She is oriented to person, place, and time. She appears well-developed and well-nourished. No distress.  HENT:  Head: Normocephalic and atraumatic.  Right Ear: Tympanic membrane and ear canal normal.  Left Ear: Tympanic membrane and ear canal normal.  Mouth/Throat: Oropharynx is clear and moist.  Eyes: Pupils are equal, round, and reactive to light. No scleral icterus.  Neck: Normal range of motion. No thyromegaly present.  Cardiovascular: Normal rate and regular rhythm.   No murmur heard. Pulmonary/Chest: Effort normal and breath sounds normal. No respiratory distress. He has no wheezes. She has no rales. She  exhibits no tenderness.  Abdominal: Soft. Bowel sounds are normal. She exhibits no distension and no mass. There is no tenderness. There is no rebound and no guarding.  Musculoskeletal: She exhibits no edema.  Lymphadenopathy:    She has no cervical adenopathy.  Neurological: She is alert and oriented to person, place, and time. She has normal patellar reflexes. She exhibits normal muscle tone. Coordination normal.  Skin: Skin is warm  and dry.  Psychiatric: She has a normal mood and affect. Her behavior is normal. Judgment and thought content normal.  Breasts: Examined lying Right: Without masses, retractions, discharge or axillary adenopathy.  Left: Without masses, retractions, discharge or axillary adenopathy. Pelvic:  deferred        Assessment & Plan:         Assessment & Plan:   Preventative care- encouraged pt to continue healthy diet, exercise and adequate sleep.    Atypical chest pain-EKG tracing is personally reviewed.  EKG notes NSR.  No acute changes.  Will refer for exercise stress test.  She is advised to go to emergency department if she develops recurrent chest pain.  Headaches-I suspect this is related to stress lack of sleep and overuse of caffeine.  I have advised her to wean back on her caffeine consumption and try not to consume caffeine after lunch time.  I have also encouraged her to ensure at least 7 hours sleep minimum.

## 2017-10-08 NOTE — Patient Instructions (Signed)
Please continue healthy diet and regular exercise. He will be contacted about your referral for exercise stress test. If you develop chest pain which does not resolve within a few minutes please proceed to the emergency department. Try to avoid caffeine after 12 noon each day and ensure at least 7 hours of sleep each night. Please schedule routine follow-up with your dentist. Complete lab work prior to leaving. Follow-up in 3 months.

## 2017-11-04 DIAGNOSIS — Z1231 Encounter for screening mammogram for malignant neoplasm of breast: Secondary | ICD-10-CM | POA: Diagnosis not present

## 2017-11-04 DIAGNOSIS — Z6832 Body mass index (BMI) 32.0-32.9, adult: Secondary | ICD-10-CM | POA: Diagnosis not present

## 2017-11-04 DIAGNOSIS — Z01419 Encounter for gynecological examination (general) (routine) without abnormal findings: Secondary | ICD-10-CM | POA: Diagnosis not present

## 2017-11-14 DIAGNOSIS — H6503 Acute serous otitis media, bilateral: Secondary | ICD-10-CM | POA: Diagnosis not present

## 2017-11-14 DIAGNOSIS — J0100 Acute maxillary sinusitis, unspecified: Secondary | ICD-10-CM | POA: Diagnosis not present

## 2017-11-14 DIAGNOSIS — J40 Bronchitis, not specified as acute or chronic: Secondary | ICD-10-CM | POA: Diagnosis not present

## 2017-11-14 DIAGNOSIS — H6983 Other specified disorders of Eustachian tube, bilateral: Secondary | ICD-10-CM | POA: Diagnosis not present

## 2017-12-15 DIAGNOSIS — M25552 Pain in left hip: Secondary | ICD-10-CM | POA: Diagnosis not present

## 2017-12-15 DIAGNOSIS — M25561 Pain in right knee: Secondary | ICD-10-CM | POA: Diagnosis not present

## 2017-12-15 DIAGNOSIS — M25562 Pain in left knee: Secondary | ICD-10-CM | POA: Diagnosis not present

## 2017-12-16 ENCOUNTER — Telehealth: Payer: Self-pay | Admitting: Family

## 2017-12-16 NOTE — Telephone Encounter (Signed)
Copied from [place] [id number]. Topic: Inquiry >> Dec 16, 2017 11:11 AM Neva Seat wrote: Dr. Paralee Cancel w/ [facility] gave pt orders for lab work needing to be done. Pt wants to know if she can have the labs done there at the office. Please call pt to discuss.

## 2017-12-17 NOTE — Telephone Encounter (Signed)
It is best if she goes to the lab dr. Alvan Dame recommends so that the results will come back to  [facility].

## 2017-12-17 NOTE — Telephone Encounter (Signed)
Notified pt and she voices understanding. She states she has an appt with a lab to have this completed but we are closer and more convenient. She will keep outside lab appt for [facility].

## 2017-12-20 DIAGNOSIS — M25552 Pain in left hip: Secondary | ICD-10-CM | POA: Diagnosis not present

## 2017-12-20 DIAGNOSIS — H6982 Other specified disorders of Eustachian tube, left ear: Secondary | ICD-10-CM | POA: Diagnosis not present

## 2017-12-29 DIAGNOSIS — M25552 Pain in left hip: Secondary | ICD-10-CM | POA: Diagnosis not present

## 2017-12-29 DIAGNOSIS — M25562 Pain in left knee: Secondary | ICD-10-CM | POA: Diagnosis not present

## 2017-12-29 DIAGNOSIS — M25561 Pain in right knee: Secondary | ICD-10-CM | POA: Diagnosis not present

## 2017-12-29 DIAGNOSIS — M7062 Trochanteric bursitis, left hip: Secondary | ICD-10-CM | POA: Diagnosis not present

## 2018-01-04 DIAGNOSIS — R768 Other specified abnormal immunological findings in serum: Secondary | ICD-10-CM | POA: Diagnosis not present

## 2018-01-04 DIAGNOSIS — M25559 Pain in unspecified hip: Secondary | ICD-10-CM | POA: Diagnosis not present

## 2018-01-04 DIAGNOSIS — M2550 Pain in unspecified joint: Secondary | ICD-10-CM | POA: Diagnosis not present

## 2018-01-04 DIAGNOSIS — R6889 Other general symptoms and signs: Secondary | ICD-10-CM | POA: Diagnosis not present

## 2018-01-19 DIAGNOSIS — M25552 Pain in left hip: Secondary | ICD-10-CM | POA: Diagnosis not present

## 2018-01-26 DIAGNOSIS — M25552 Pain in left hip: Secondary | ICD-10-CM | POA: Diagnosis not present

## 2018-02-04 DIAGNOSIS — M2560 Stiffness of unspecified joint, not elsewhere classified: Secondary | ICD-10-CM | POA: Diagnosis not present

## 2018-02-04 DIAGNOSIS — M545 Low back pain: Secondary | ICD-10-CM | POA: Diagnosis not present

## 2018-02-04 DIAGNOSIS — M9903 Segmental and somatic dysfunction of lumbar region: Secondary | ICD-10-CM | POA: Diagnosis not present

## 2018-02-04 DIAGNOSIS — R293 Abnormal posture: Secondary | ICD-10-CM | POA: Diagnosis not present

## 2018-02-07 DIAGNOSIS — R293 Abnormal posture: Secondary | ICD-10-CM | POA: Diagnosis not present

## 2018-02-07 DIAGNOSIS — M545 Low back pain: Secondary | ICD-10-CM | POA: Diagnosis not present

## 2018-02-07 DIAGNOSIS — M9903 Segmental and somatic dysfunction of lumbar region: Secondary | ICD-10-CM | POA: Diagnosis not present

## 2018-02-07 DIAGNOSIS — M2560 Stiffness of unspecified joint, not elsewhere classified: Secondary | ICD-10-CM | POA: Diagnosis not present

## 2018-03-14 DIAGNOSIS — R293 Abnormal posture: Secondary | ICD-10-CM | POA: Diagnosis not present

## 2018-03-14 DIAGNOSIS — M9903 Segmental and somatic dysfunction of lumbar region: Secondary | ICD-10-CM | POA: Diagnosis not present

## 2018-03-14 DIAGNOSIS — M2560 Stiffness of unspecified joint, not elsewhere classified: Secondary | ICD-10-CM | POA: Diagnosis not present

## 2018-03-14 DIAGNOSIS — M545 Low back pain: Secondary | ICD-10-CM | POA: Diagnosis not present

## 2018-03-23 DIAGNOSIS — M2560 Stiffness of unspecified joint, not elsewhere classified: Secondary | ICD-10-CM | POA: Diagnosis not present

## 2018-03-23 DIAGNOSIS — R293 Abnormal posture: Secondary | ICD-10-CM | POA: Diagnosis not present

## 2018-03-23 DIAGNOSIS — M9903 Segmental and somatic dysfunction of lumbar region: Secondary | ICD-10-CM | POA: Diagnosis not present

## 2018-03-23 DIAGNOSIS — M545 Low back pain: Secondary | ICD-10-CM | POA: Diagnosis not present

## 2018-03-30 DIAGNOSIS — R293 Abnormal posture: Secondary | ICD-10-CM | POA: Diagnosis not present

## 2018-03-30 DIAGNOSIS — M545 Low back pain: Secondary | ICD-10-CM | POA: Diagnosis not present

## 2018-03-30 DIAGNOSIS — M9903 Segmental and somatic dysfunction of lumbar region: Secondary | ICD-10-CM | POA: Diagnosis not present

## 2018-03-30 DIAGNOSIS — M2560 Stiffness of unspecified joint, not elsewhere classified: Secondary | ICD-10-CM | POA: Diagnosis not present

## 2018-04-06 DIAGNOSIS — R293 Abnormal posture: Secondary | ICD-10-CM | POA: Diagnosis not present

## 2018-04-06 DIAGNOSIS — M2560 Stiffness of unspecified joint, not elsewhere classified: Secondary | ICD-10-CM | POA: Diagnosis not present

## 2018-04-06 DIAGNOSIS — M545 Low back pain: Secondary | ICD-10-CM | POA: Diagnosis not present

## 2018-04-06 DIAGNOSIS — M9903 Segmental and somatic dysfunction of lumbar region: Secondary | ICD-10-CM | POA: Diagnosis not present

## 2018-05-29 ENCOUNTER — Telehealth: Payer: Self-pay | Admitting: Family

## 2018-05-29 NOTE — Telephone Encounter (Signed)
-----   Message from Howie Ill sent at 05/26/2018 11:50 AM EDT ----- Regarding: Exercise Tolerance Test  Patient wants to know if she needs to have this test done? I contacted the patient and she said this was ordered months ago and does not need it any more.   Thanks  Romie Minus

## 2018-05-29 NOTE — Telephone Encounter (Signed)
Could you please contact patient and ask her if she is still having intermittent chest pain?  If so, then I will re-order her exercise stress test.

## 2018-05-30 DIAGNOSIS — R102 Pelvic and perineal pain: Secondary | ICD-10-CM | POA: Diagnosis not present

## 2018-05-30 DIAGNOSIS — N3281 Overactive bladder: Secondary | ICD-10-CM | POA: Diagnosis not present

## 2018-05-30 NOTE — Telephone Encounter (Signed)
We can hold off for now and discuss further at her follow up.

## 2018-05-30 NOTE — Telephone Encounter (Signed)
Spoke with pt. Pt states she did not cancel the stress test she just wasn't sure if she still needed to have it because she has not been seen by [place] since 10/2017 and this was the first time she was contacted by anyone to schedule the test. She states that she no longer has chest pain. She does have some "GYN concerns that is being worked up at this time and she would like to discuss this with PCP". Pt was due for a 3 month follow up after last visit but did not schedule. Appt has been made for 06/15/18 at 9am as pt wanted to complete an u/s before seeing PCP. Pt wants to know if she still needs to proceed with stress test?

## 2018-05-30 NOTE — Telephone Encounter (Signed)
Notified pt and she is agreeable to plan.

## 2018-06-08 DIAGNOSIS — R102 Pelvic and perineal pain: Secondary | ICD-10-CM | POA: Diagnosis not present

## 2018-06-09 DIAGNOSIS — R102 Pelvic and perineal pain: Secondary | ICD-10-CM | POA: Diagnosis not present

## 2018-06-09 DIAGNOSIS — N3281 Overactive bladder: Secondary | ICD-10-CM | POA: Diagnosis not present

## 2018-06-15 ENCOUNTER — Ambulatory Visit: Payer: BLUE CROSS/BLUE SHIELD | Admitting: Family

## 2018-06-15 ENCOUNTER — Encounter: Payer: Self-pay | Admitting: Family

## 2018-06-15 VITALS — BP 129/75 | HR 75 | Temp 98.4°F | Ht 70.5 in | Wt 224.0 lb

## 2018-06-15 DIAGNOSIS — M5416 Radiculopathy, lumbar region: Secondary | ICD-10-CM

## 2018-06-15 DIAGNOSIS — R51 Headache: Secondary | ICD-10-CM

## 2018-06-15 DIAGNOSIS — R0789 Other chest pain: Secondary | ICD-10-CM | POA: Diagnosis not present

## 2018-06-15 DIAGNOSIS — R519 Headache, unspecified: Secondary | ICD-10-CM

## 2018-06-15 MED ORDER — MELOXICAM 7.5 MG PO TABS: 7.5000 mg | ORAL_TABLET | Freq: Every day | ORAL | 0 refills | Status: DC

## 2018-06-15 NOTE — Patient Instructions (Signed)
Stop Duexis, start meloxicam. Complete x-ray on the first floor.  You should be contacted about your referral to Physical therapy.

## 2018-06-15 NOTE — Progress Notes (Signed)
Subjective:    Patient ID: Tiffany Sharp, female    DOB: 15-Jun-1971, 47 y.o.   MRN: [id number]  HPI  Tiffany Sharp is a 47 yr old female who presents today for follow up.  Headaches-  Last visit we discussed working on increasing sleep and decreasig caffeine consumption. Reports near resolution of headaches.    Atypical chest pain- had normal EKG last visit and was referred for ETT.  Denies further chest pain.   L hip pain- reports progressive worsening of her hip pain.  Reports some days if she turns the wrong way she is "out of commission." Reports that in May r. Olin sent her to a rheumatologist (saw Tiffany Sharp).  Reports that autoimmune disease was ruled out.  Reports that MRI showed more arthritis.  Had ESI into the joint.  Pain was back in 2 weeks.  Returned to her OB last week because she wanted to rule out something pelvic.  Was told only a very small fibroid and everything looks.  OB told her that it might be FM.    Reports that she had a stabbing pain in her left groin when she tried to turn around in the car. Reports that by the end of the day it hurts to sit upright in a chair.   "feels like it is in my tailbone."   Using Duexis without improvement.   Review of Systems See HPI  Past Medical History:  Diagnosis Date  . Abscess   . Breast lump    had biopsy, benign  . Depression    PPD - post partum depression  . Eclampsia   . Endometritis   . HELLP (hemolytic anemia/elev liver enzymes/low platelets in pregnancy)   . History of echocardiogram    Echo 2/17: EF 60-65%, normal wall motion, mild RVE, atrial septal aneurysm (findings consistent with increased left atrial pressure)  . HSV-2 (herpes simplex virus 2) infection   . IBS (irritable bowel syndrome)   . Knee instability   . Murmur    told this as a child - no specifict testing ever done  . Postpartum hemorrhage   . Pregnancy induced hypertension    no meds outside of pregnancy  . Toxic shock syndrome ([place])  1997   started with an abscess right axilla     Social History   Socioeconomic History  . Marital status: Married    Spouse name: Not on file  . Number of children: Not on file  . Years of education: Not on file  . Highest education level: Not on file  Occupational History  . 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 Financial resource strain: Not on file  . Food insecurity:    Worry: Not on file    Inability: Not on file  . Transportation needs:    Medical: Not on file    Non-medical: Not on file  Tobacco Use  . Smoking status: Never Smoker  . Smokeless tobacco: Never Used  Substance and Sexual Activity  . Alcohol use: No  . Drug use: No  . Sexual activity: Not Currently    Birth control/protection: None  Lifestyle  . Physical activity:    Days per week: Not on file    Minutes per session: Not on file  . Stress: Not on file  Relationships  . Social connections:    Talks on phone: Not on file    Gets together: Not on file    Attends religious service: Not on  file    Active member of club or organization: Not on file    Attends meetings of clubs or organizations: Not on file    Relationship status: Not on file  . Intimate partner violence:    Fear of current or ex partner: Not on file    Emotionally abused: Not on file    Physically abused: Not on file    Forced sexual activity: Not on file  Other Topics Concern  . 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 BREAST BIOPSY  2013   benign  . DILATION AND CURETTAGE OF UTERUS    . DILATION AND EVACUATION N/A 09/01/2013   Procedure: DILATATION AND EVACUATION;  Surgeon: Delice Lesch, MD;  Location: [facility];  Service: Gynecology;  Laterality: N/A;  . INCISE AND [place]     under left arm    Family History  Problem Relation Age of Onset  . Cancer Mother        rare form of melanoma (hands)  . Hypertension Father   . Heart disease Father        chf, no CAD  . Transient ischemic attack Father   . Heart attack Neg Hx     Allergies  Allergen Reactions  . Penicillins Hives  . Sulfa Antibiotics Hives    Current Outpatient Medications on File Prior to Visit  Medication Sig Dispense Refill  . BORIC ACID EX Place 1 tablet vaginally as needed. After menstruation    . co-enzyme Q-10 30 MG capsule Take 300 mg by mouth daily.    . Cyanocobalamin (B-12 PO) Take 1 each by mouth daily. Mix in drink    . DIGESTIVE ENZYMES PO Take 1 tablet by mouth daily.      Marland Kitchen docusate sodium (COLACE) 100 MG capsule Take 100 mg by mouth daily.      . famotidine (PEPCID) 20 MG tablet Take 1 tablet (20 mg total) by mouth 2 (two) times daily. 60 tablet 3  . lisdexamfetamine (VYVANSE) 20 MG capsule Take 20 mg by mouth daily with lunch.    . Multiple Vitamins-Minerals (MULTIVITAMIN WITH MINERALS) tablet Take 1 tablet by mouth daily.     No current facility-administered medications on file prior to visit.     BP 129/75 (BP Location: Right Arm, Patient Position: Sitting, Cuff Size: Large)   Pulse 75   Temp 98.4 F (36.9 C) (Oral)   Ht 5' 10.5" (1.791 m)   Wt 224 lb (101.6 kg)   SpO2 97%   BMI 31.69 kg/m       Objective:   Physical Exam  Constitutional: She is oriented to person, place, and time. She appears well-developed and well-nourished.  Cardiovascular: Normal rate, regular rhythm and normal heart sounds.  No murmur heard. Pulmonary/Chest: Effort normal and breath sounds normal. No respiratory distress. She has no wheezes.  Musculoskeletal:       Cervical back: She exhibits no tenderness.       Thoracic back: She exhibits no tenderness.       Lumbar back: She exhibits no tenderness.  Neurological: She is alert and oriented to person, place, and time.  Skin:  Skin is warm  and dry.  Psychiatric: She has a normal mood and affect. Her behavior is normal. Judgment and thought content normal.          Assessment & Plan:  Headaches- resolved.  Monitor.  Lumbar radiculopathy- symptoms most consistent with left lumbar radiculopathy. Advised pt as follows:    D/c duexis,start meloxicam. Refer for PT.  Follow up in 6 weeks.   Atypical chest pain- resolved. Declines ETT at this time. She feels that her symptoms were stress related.

## 2018-06-20 ENCOUNTER — Ambulatory Visit (HOSPITAL_BASED_OUTPATIENT_CLINIC_OR_DEPARTMENT_OTHER)
Admission: RE | Admit: 2018-06-20 | Discharge: 2018-06-20 | Disposition: A | Discharge status: Home / Self Care | Payer: BLUE CROSS/BLUE SHIELD | Source: Ambulatory Visit | Attending: Family | Admitting: Family

## 2018-06-20 DIAGNOSIS — M4186 Other forms of scoliosis, lumbar region: Secondary | ICD-10-CM | POA: Diagnosis not present

## 2018-06-20 DIAGNOSIS — M5416 Radiculopathy, lumbar region: Secondary | ICD-10-CM | POA: Insufficient documentation

## 2018-07-27 ENCOUNTER — Ambulatory Visit: Payer: BLUE CROSS/BLUE SHIELD | Admitting: Family

## 2018-07-27 DIAGNOSIS — Z0289 Encounter for other administrative examinations: Secondary | ICD-10-CM

## 2018-08-16 ENCOUNTER — Encounter: Payer: Self-pay | Admitting: Family

## 2018-08-18 DIAGNOSIS — Z23 Encounter for immunization: Secondary | ICD-10-CM | POA: Diagnosis not present

## 2019-09-22 ENCOUNTER — Ambulatory Visit: Payer: Self-pay | Admitting: *Deleted

## 2019-09-22 DIAGNOSIS — R079 Chest pain, unspecified: Secondary | ICD-10-CM | POA: Diagnosis not present

## 2019-09-22 NOTE — Telephone Encounter (Signed)
Pt called stating she is having chest pain for 2 wks. Please advise.  Feels like heartburn- antiacids not making it go away.  Call to patient- patient states she has pain on the left side of her chest- she states the pain last 1-2 minutes and then dissipates. Patient has been eating TUMS for this and it is not getting better. Patient does have history of reflux- but she states she is concerned. Advised UC for check of heart and call back next week for follow up appointment. She agrees to go.   Reason for Disposition . [1] Chest pain lasting < 5 minutes AND [2] NO chest pain or cardiac symptoms (e.g., breathing difficulty, sweating) now  Answer Assessment - Initial Assessment Questions 1. LOCATION: "Where does it hurt?"       On left at heart 2. RADIATION: "Does the pain go anywhere else?" (e.g., into neck, jaw, arms, back)     no 3. ONSET: "When did the chest pain begin?" (Minutes, hours or days)      2 weeks 4. PATTERN "Does the pain come and go, or has it been constant since it started?"  "Does it get worse with exertion?"      Comes and goes- feels several times a day- soreness seems to stay 5. DURATION: "How long does it last" (e.g., seconds, minutes, hours)     Minute or two 6. SEVERITY: "How bad is the pain?"  (e.g., Scale 1-10; mild, moderate, or severe)    - MILD (1-3): doesn't interfere with normal activities     - MODERATE (4-7): interferes with normal activities or awakens from sleep    - SEVERE (8-10): excruciating pain, unable to do any normal activities       mild 7. CARDIAC RISK FACTORS: "Do you have any history of heart problems or risk factors for heart disease?" (e.g., angina, prior heart attack; diabetes, high blood pressure, high cholesterol, smoker, or strong family history of heart disease)     Hx heart murmer, father- CHF 8. PULMONARY RISK FACTORS: "Do you have any history of lung disease?"  (e.g., blood clots in lung, asthma, emphysema, birth control pills)      no 9. CAUSE: "What do you think is causing the chest pain?"     GERD/reflux- patient is using her digestive enzymes and has not had problem since pregancy 10. OTHER SYMPTOMS: "Do you have any other symptoms?" (e.g., dizziness, nausea, vomiting, sweating, fever, difficulty breathing, cough)       no 11. PREGNANCY: "Is there any chance you are pregnant?" "When was your last menstrual period?"       No- vasectomy as birth control  Protocols used: CHEST PAIN-A-AH

## 2019-09-25 ENCOUNTER — Emergency Department (HOSPITAL_BASED_OUTPATIENT_CLINIC_OR_DEPARTMENT_OTHER): Payer: BC Managed Care – PPO

## 2019-09-25 ENCOUNTER — Emergency Department (HOSPITAL_BASED_OUTPATIENT_CLINIC_OR_DEPARTMENT_OTHER)
Admission: EM | Admit: 2019-09-25 | Discharge: 2019-09-25 | Disposition: A | Discharge status: Home / Self Care | Payer: BC Managed Care – PPO | Attending: Emergency Medicine | Admitting: Emergency Medicine

## 2019-09-25 ENCOUNTER — Ambulatory Visit: Payer: Self-pay | Admitting: *Deleted

## 2019-09-25 ENCOUNTER — Other Ambulatory Visit: Payer: Self-pay

## 2019-09-25 ENCOUNTER — Encounter (HOSPITAL_BASED_OUTPATIENT_CLINIC_OR_DEPARTMENT_OTHER): Payer: Self-pay

## 2019-09-25 DIAGNOSIS — Z79899 Other long term (current) drug therapy: Secondary | ICD-10-CM | POA: Diagnosis not present

## 2019-09-25 DIAGNOSIS — R0789 Other chest pain: Secondary | ICD-10-CM | POA: Diagnosis not present

## 2019-09-25 DIAGNOSIS — R079 Chest pain, unspecified: Secondary | ICD-10-CM | POA: Diagnosis not present

## 2019-09-25 DIAGNOSIS — R072 Precordial pain: Secondary | ICD-10-CM | POA: Diagnosis not present

## 2019-09-25 LAB — CBC
HCT: 43.9 % (ref 36.0–46.0)
Hemoglobin: 13.2 g/dL (ref 12.0–15.0)
MCH: 22.3 pg — ABNORMAL LOW (ref 26.0–34.0)
MCHC: 30.1 g/dL (ref 30.0–36.0)
MCV: 74.2 fL — ABNORMAL LOW (ref 80.0–100.0)
Platelets: 167 10*3/uL (ref 150–400)
RBC: 5.92 MIL/uL — ABNORMAL HIGH (ref 3.87–5.11)
RDW: 14.8 % (ref 11.5–15.5)
WBC: 5.4 10*3/uL (ref 4.0–10.5)
nRBC: 0 % (ref 0.0–0.2)

## 2019-09-25 LAB — BASIC METABOLIC PANEL
Anion gap: 6 (ref 5–15)
BUN: 12 mg/dL (ref 6–20)
CO2: 27 mmol/L (ref 22–32)
Calcium: 9.4 mg/dL (ref 8.9–10.3)
Chloride: 105 mmol/L (ref 98–111)
Creatinine, Ser: 0.62 mg/dL (ref 0.44–1.00)
GFR calc Af Amer: >60 mL/min (ref >60)
GFR calc non Af Amer: >60 mL/min (ref >60)
Glucose, Bld: 96 mg/dL (ref 70–99)
Potassium: 3.9 mmol/L (ref 3.5–5.1)
Sodium: 138 mmol/L (ref 135–145)

## 2019-09-25 LAB — TROPONIN I (HIGH SENSITIVITY)
Troponin I (High Sensitivity): 3 ng/L (ref <18)
Troponin I (High Sensitivity): 3 ng/L (ref <18)

## 2019-09-25 LAB — PREGNANCY, URINE: Preg Test, Ur: NEGATIVE

## 2019-09-25 MED ORDER — SUCRALFATE 1 G PO TABS: 1.0000 g | ORAL_TABLET | Freq: Three times a day (TID) | ORAL | 0 refills | Status: DC

## 2019-09-25 MED ORDER — ALUM & MAG HYDROXIDE-SIMETH 200-200-20 MG/5ML PO SUSP
30.0000 mL | Freq: Once | ORAL | Status: AC
  Administered 2019-09-25: 30 mL via ORAL
  Filled 2019-09-25: qty 30

## 2019-09-25 MED ORDER — KETOROLAC TROMETHAMINE 30 MG/ML IJ SOLN
30.0000 mg | Freq: Once | INTRAMUSCULAR | Status: AC
  Administered 2019-09-25: 30 mg via INTRAVENOUS
  Filled 2019-09-25: qty 1

## 2019-09-25 MED ORDER — FAMOTIDINE 20 MG PO TABS: 20.0000 mg | ORAL_TABLET | Freq: Two times a day (BID) | ORAL | 0 refills | Status: DC

## 2019-09-25 MED ORDER — LIDOCAINE VISCOUS HCL 2 % MT SOLN
15.0000 mL | Freq: Once | OROMUCOSAL | Status: AC
  Administered 2019-09-25: 16:00:00 15 mL via ORAL
  Filled 2019-09-25: qty 15

## 2019-09-25 MED ORDER — NITROGLYCERIN 0.4 MG SL SUBL
0.4000 mg | SUBLINGUAL_TABLET | SUBLINGUAL | Status: DC | PRN
  Administered 2019-09-25: 0.4 mg via SUBLINGUAL
  Filled 2019-09-25: qty 1

## 2019-09-25 MED FILL — SUCRALFATE 1 GM TABLET: 1 | 7 days supply | Qty: 28 | Fill #0

## 2019-09-25 MED FILL — SM ACID REDUCER 20 MG TAB: 20 | 13 days supply | Qty: 25 | Fill #0

## 2019-09-25 NOTE — Telephone Encounter (Signed)
  Reason for Disposition . [1] Chest pain lasts > 5 minutes AND [2] occurred in past 3 days (72 hours)  Answer Assessment - Initial Assessment Questions 1. LOCATION: "Where does it hurt?"       Middle of chest 2. RADIATION: "Does the pain go anywhere else?" (e.g., into neck, jaw, arms, back)     no 3. ONSET: "When did the chest pain begin?" (Minutes, hours or days)     09/22/2019 4. PATTERN "Does the pain come and go, or has it been constant since it started?"  "Does it get worse with exertion?"      constant 5. DURATION: "How long does it last" (e.g., seconds, minutes, hours)     days 6. SEVERITY: "How bad is the pain?"  (e.g., Scale 1-10; mild, moderate, or severe)    - MILD (1-3): doesn't interfere with normal activities     - MODERATE (4-7): interferes with normal activities or awakens from sleep    - SEVERE (8-10): excruciating pain, unable to do any normal activities      1-2 out of 10 7. CARDIAC RISK FACTORS: "Do you have any history of heart problems or risk factors for heart disease?" (e.g., angina, prior heart attack; diabetes, high blood pressure, high cholesterol, smoker, or strong family history of heart disease)     Heart murmur; family history of heart disease 8. PULMONARY RISK FACTORS: "Do you have any history of lung disease?"  (e.g., blood clots in lung, asthma, emphysema, birth control pills)   Not sure 9. CAUSE: "What do you think is causing the chest pain?"     Not sure 10. OTHER SYMPTOMS: "Do you have any other symptoms?" (e.g., dizziness, nausea, vomiting, sweating, fever, difficulty breathing, cough)       no 11. PREGNANCY: "Is there any chance you are pregnant?" "When was your last menstrual period?"       No husband had vasectomy; LMP 07/2019  Protocols used: CHEST PAIN-A-AH

## 2019-09-25 NOTE — Telephone Encounter (Addendum)
Pt called with complaints of chest pain (middle of her chest) which started over a week ago; originally it was on the left side, and it felt like she pulled a muscle because she was moving boxes the week prior; at that time the pain was described as soreness; she says that it feel "kinda like congestion", and feels like "I have a weight sitting on my chest; she went to [facility] ([facility]) but was sent to the ED for labs; she did go to the ED but was told it was over $1000 to draw labs; she says that she had a ekg at [facility] and it was normal; the pt has been taking anti-acids (Tums) but her discomfort is still present; burping does help; recommendations made per nurse triage protocol; the pt verbalized understanding, but says that she does have the money to be seen in the ED; the pt sees Debbrah Alar, [facility]; spoke [place] and she agrees the pt needs to be evaluated in the ED; pt given information, but she does not want to go to ED for evaluation, she wants to have labs done; also advised pt that she will be seen in the ED regardless of her ability to pay  the pt can be contacted at [phone]; will route to office for final disposition.

## 2019-09-25 NOTE — Discharge Instructions (Addendum)
Your work-up today was reassuring.  Start taking Pepcid twice daily with meals for the next 2 weeks.  Take Carafate with meals and at night for the next week.  Avoid spicy foods, fried foods, acidic foods, or alcohol.  You can take 1 to 2 tablets of Tylenol every 6 hours as needed for pain.  Do not exceed more than 4000 mg of Tylenol daily.  Follow-up with primary care provider or cardiologist for reevaluation of symptoms.  Return to the emergency department if any concerning signs or symptoms develop such as severe chest pains, shortness of breath, persistent vomiting, or loss of consciousness.

## 2019-09-25 NOTE — ED Notes (Signed)
Pt sts post-NTG that she feels "more aware" of chest discomfort; sts she does feel her HR is faster.

## 2019-09-25 NOTE — ED Provider Notes (Signed)
[facility] Provider Note   CSN: [id number] Arrival date & time: 09/25/19  1059     History   Chief Complaint Chief Complaint  Patient presents with  . Chest Pain    HPI Tiffany Sharp is a 48 y.o. female with history of IBS, heart murmur, HELLP syndrome and toxic shock syndrome presents for evaluation of acute onset, intermittent but persistent chest pains for 1 week.  Reports symptoms initially began in the left side of the chest but over the last 2 or 3 days has been substernal.  She describes it as a mild chest pressure, coming and going.  It is not exertional or pleuritic in nature.  Initially thought that she was experiencing acid reflux but she had little to no improvement with antacids and the pain has been persistent.  Has felt mildly short of breath at times usually after meals.  Denies nausea, vomiting, abdominal pain, diaphoresis, lightheadedness, or syncope.  She is a non-smoker denies recreational drug use or excessive alcohol intake.  She does consume 1 or 2 caffeine beverages daily.  Denies recent travel or surgeries, hemoptysis, prior history of DVT or PE, leg swelling, or OCP/hormone replacement therapy.     The history is provided by the patient.    Past Medical History:  Diagnosis Date  . Abscess   . Breast lump    had biopsy, benign  . Depression    PPD - post partum depression  . Eclampsia   . Endometritis   . HELLP (hemolytic anemia/elev liver enzymes/low platelets in pregnancy)   . History of echocardiogram    Echo 2/17: EF 60-65%, normal wall motion, mild RVE, atrial septal aneurysm (findings consistent with increased left atrial pressure)  . HSV-2 (herpes simplex virus 2) infection   . IBS (irritable bowel syndrome)   . Knee instability   . Murmur    told this as a child - no specifict testing ever done  . Postpartum hemorrhage   . Pregnancy induced hypertension    no meds outside of pregnancy  . Toxic shock  syndrome ([place]) 1997   started with an abscess right axilla    Patient Active Problem List   Diagnosis Date Noted  . Preventative health care 06/02/2016  . Osteoarthritis 06/02/2016  . Hyperglycemia 06/05/2015  . ADHD (attention deficit hyperactivity disorder) 06/03/2015  . Hip pain 06/03/2015  . Overweight 06/03/2015  . Preterm delivery 09/02/2013  . GBS (group B Streptococcus carrier), +RV culture, currently pregnant 08/28/2013  . Thrombocytopenia, unspecified ([place]) 08/28/2013  . HSV (herpes simplex virus) infection 08/25/2013  . H/O HELLP syndrome, currently pregnant 08/25/2013  . H/O PPH (postpartum hemorrhage) 08/25/2013  . Anemia 08/25/2013  . Breast lump 08/25/2013  . Allergy to penicillin 08/25/2013  . Rubella non-immune status, antepartum 08/25/2013  . History of postpartum depression 08/25/2013  . History of eclampsia 08/25/2013  . Advanced maternal age (AMA), 40 years or greater 08/25/2013  . Obesity, unspecified 08/25/2013  . History of toxic shock syndrome 08/25/2013  . History of loop electrical excision procedure (LEEP) 08/25/2013  . H/O LGA (large for gestational age) fetus 08/25/2013  . Preterm premature rupture of membranes in third trimester 08/25/2013  . Breast asymmetry in female 10/12/2012    Past Surgical History:  Procedure Laterality Date  . BREAST BIOPSY  2013   benign  . DILATION AND CURETTAGE OF UTERUS    . DILATION AND EVACUATION N/A 09/01/2013   Procedure: DILATATION AND EVACUATION;  Surgeon: Levada Dy  Birdie Sons, MD;  Location: [facility];  Service: Gynecology;  Laterality: N/A;  . INCISE AND [place]   under left arm     OB History    Gravida  8   Para  5   Term  4   Preterm  1   AB  3   Living  5     SAB  3   TAB      Ectopic      Multiple      Live Births  5            Home Medications    Prior to Admission medications   Medication Sig Start Date End Date Taking? Authorizing Provider  BORIC ACID EX Place  1 tablet vaginally as needed. After menstruation    [provider]  co-enzyme Q-10 30 MG capsule Take 300 mg by mouth daily.    [provider]  Cyanocobalamin (B-12 PO) Take 1 each by mouth daily. Mix in drink    [provider]  DIGESTIVE ENZYMES PO Take 1 tablet by mouth daily.      [provider]  docusate sodium (COLACE) 100 MG capsule Take 100 mg by mouth daily.      [provider]  famotidine (PEPCID) 20 MG tablet Take 1 tablet (20 mg total) by mouth 2 (two) times daily. 09/25/19   Risa Auman A, PA-C  lisdexamfetamine (VYVANSE) 20 MG capsule Take 20 mg by mouth daily with lunch.    [provider]  meloxicam (MOBIC) 7.5 MG tablet Take 1 tablet (7.5 mg total) by mouth daily. 06/15/18   Debbrah Alar, NP  Multiple Vitamins-Minerals (MULTIVITAMIN WITH MINERALS) tablet Take 1 tablet by mouth daily. 06/03/16   Debbrah Alar, NP  sucralfate (CARAFATE) 1 g tablet Take 1 tablet (1 g total) by mouth 4 (four) times daily -  with meals and at bedtime. 09/25/19   Renita Papa, PA-C    Family History Family History  Problem Relation Age of Onset  . Cancer Mother        rare form of melanoma (hands)  . Hypertension Father   . Heart disease Father        chf, no CAD  . Transient ischemic attack Father   . Heart attack Neg Hx     Social History Social History   Tobacco Use  . Smoking status: Never Smoker  . Smokeless tobacco: Never Used  Substance Use Topics  . Alcohol use: No  . Drug use: No     Allergies   Penicillins and Sulfa antibiotics   Review of Systems Review of Systems  Constitutional: Negative for chills, diaphoresis and fever.  Respiratory: Positive for shortness of breath. Negative for cough.   Cardiovascular: Positive for chest pain. Negative for palpitations and leg swelling.  Gastrointestinal: Negative for abdominal pain, nausea and vomiting.  Neurological: Negative for light-headedness.  All  other systems reviewed and are negative.    Physical Exam Updated Vital Signs BP (!) 154/68   Pulse 67   Temp 98.1 F (36.7 C) (Oral)   Resp 18   Ht 5\' 11"  (1.803 m)   Wt 99.8 kg   LMP 08/25/2019   SpO2 100%   BMI 30.68 kg/m   Physical Exam Vitals signs and nursing note reviewed.  Constitutional:      General: She is not in acute distress.    Appearance: She is well-developed.  HENT:     Head:  Normocephalic and atraumatic.  Eyes:     General:        Right eye: No discharge.        Left eye: No discharge.     Conjunctiva/sclera: Conjunctivae normal.  Neck:     Vascular: No JVD.     Trachea: No tracheal deviation.  Cardiovascular:     Rate and Rhythm: Normal rate and regular rhythm.     Pulses:          Carotid pulses are 2+ on the right side and 2+ on the left side.      Radial pulses are 2+ on the right side and 2+ on the left side.       Dorsalis pedis pulses are 2+ on the right side and 2+ on the left side.       Posterior tibial pulses are 2+ on the right side and 2+ on the left side.     Comments: 2+ radial and DP/PT pulses bilaterally, Homans sign absent bilaterally, no lower extremity edema, no palpable cords, compartments are soft  Pulmonary:     Effort: Pulmonary effort is normal. No tachypnea or respiratory distress.     Breath sounds: Normal breath sounds.  Chest:     Chest wall: No tenderness.  Abdominal:     General: There is no distension.     Palpations: Abdomen is soft.  Musculoskeletal:     Right lower leg: She exhibits no tenderness. No edema.     Left lower leg: She exhibits no tenderness. No edema.  Skin:    General: Skin is warm and dry.     Findings: No erythema.  Neurological:     Mental Status: She is alert.  Psychiatric:        Behavior: Behavior normal.      ED Treatments / Results  Labs (all labs ordered are listed, but only abnormal results are displayed) Labs Reviewed  CBC - Abnormal; Notable for the following components:       Result Value   RBC 5.92 (*)    MCV 74.2 (*)    MCH 22.3 (*)    All other components within normal limits  BASIC METABOLIC PANEL  PREGNANCY, URINE  TROPONIN I (HIGH SENSITIVITY)  TROPONIN I (HIGH SENSITIVITY)    EKG EKG Interpretation  Date/Time:  Monday September 25 2019 11:03:46 EST Ventricular Rate:  69 PR Interval:  170 QRS Duration: 90 QT Interval:  384 QTC Calculation: 411 R Axis:   47 Text Interpretation: Normal sinus rhythm Normal ECG No significant change since last tracing Confirmed by Isla Pence [phone]) on 09/25/2019 1:07:59 PM   Radiology Dg Chest Port 1 View  Result Date: 09/25/2019 CLINICAL DATA:  Chest pain EXAM: PORTABLE CHEST 1 VIEW COMPARISON:  September 28, 2016 FINDINGS: Lungs are clear. Heart size and pulmonary vascularity are normal. No adenopathy. No pneumothorax. No bone lesions. IMPRESSION: No edema or consolidation.  Cardiac silhouette within normal limits. Electronically Signed   By: Lowella Grip III M.D.   On: 09/25/2019 12:52    Procedures Procedures (including critical care time)  Medications Ordered in ED Medications  ketorolac (TORADOL) 30 MG/ML injection 30 mg (30 mg Intravenous Given 09/25/19 1339)  alum & mag hydroxide-simeth (MAALOX/MYLANTA) 200-200-20 MG/5ML suspension 30 mL (30 mLs Oral Given 09/25/19 1613)    And  lidocaine (XYLOCAINE) 2 % viscous mouth solution 15 mL (15 mLs Oral Given 09/25/19 1613)     Initial Impression / Assessment and Plan / ED  Course  I have reviewed the triage vital signs and the nursing notes.  Pertinent labs & imaging results that were available during my care of the patient were reviewed by me and considered in my medical decision making (see chart for details).        Patient presenting for evaluation of intermittent progressively worsening substernal chest pressure for 1 week.  She is afebrile, intermittently hypertensive in the ED.  Vital signs otherwise stable.  She is nontoxic in  appearance.  The pain is not pleuritic, exertional, or reproducible on palpation.  Her EKG shows no acute ischemic abnormalities.  Chest x-ray shows no acute cardiopulmonary abnormalities.  Lab work reviewed by me shows no leukocytosis, no anemia, no metabolic derangements, no renal insufficiency.  Serial troponins are negative.  Doubt PE in the absence of risk factors and she is PERC negative.  Doubt dissection, cardiac tamponade, esophageal rupture, pneumonia, or pneumothorax.  She had no improvement in her pain after administration of Toradol or sublingual nitroglycerin but did have improvement in her pain after administration of a GI cocktail so I suspect that her pain could be related to GERD.  She tells me that she has started intermittent fasting but has been eating a diet that does contain a fair amount of citrus fruits and tomato-containing products.  Discussed dietary modifications, will start course of Pepcid and Carafate.  Recommend follow-up with PCP and cardiology on an outpatient basis.  Discussed strict ED return precautions. Patient verbalized understanding of and agreement with plan and is safe for discharge home at this time.    Final Clinical Impressions(s) / ED Diagnoses   Final diagnoses:  Atypical chest pain    ED Discharge Orders         Ordered    famotidine (PEPCID) 20 MG tablet  2 times daily     09/25/19 1637    sucralfate (CARAFATE) 1 g tablet  3 times daily with meals & bedtime     09/25/19 1637           Renita Papa, PA-C 09/26/19 1027    Isla Pence, MD 09/26/19 1042

## 2019-09-25 NOTE — ED Triage Notes (Signed)
Pt reports CP X 1 week reports that she has been using antiacid medications with no relief.

## 2019-09-25 NOTE — ED Notes (Signed)
Attempted IV access x 3- unable to obtain

## 2019-09-26 NOTE — Telephone Encounter (Signed)
Noted. Pt went to ER.

## 2019-10-04 DIAGNOSIS — M25552 Pain in left hip: Secondary | ICD-10-CM | POA: Diagnosis not present

## 2019-11-11 DIAGNOSIS — G8929 Other chronic pain: Secondary | ICD-10-CM | POA: Insufficient documentation

## 2019-11-17 DIAGNOSIS — Z1231 Encounter for screening mammogram for malignant neoplasm of breast: Secondary | ICD-10-CM | POA: Diagnosis not present

## 2019-11-17 DIAGNOSIS — Z6833 Body mass index (BMI) 33.0-33.9, adult: Secondary | ICD-10-CM | POA: Diagnosis not present

## 2019-11-17 DIAGNOSIS — Z01419 Encounter for gynecological examination (general) (routine) without abnormal findings: Secondary | ICD-10-CM | POA: Diagnosis not present

## 2020-01-01 ENCOUNTER — Ambulatory Visit: Payer: BC Managed Care – PPO | Attending: Internal Medicine

## 2020-01-01 DIAGNOSIS — Z23 Encounter for immunization: Secondary | ICD-10-CM | POA: Insufficient documentation

## 2020-01-01 NOTE — Progress Notes (Signed)
   Covid-19 Vaccination Clinic  Name:  [facility]    MRN: [id number] DOB: 1971/02/26  01/01/2020  Ms. [name] was observed post Covid-19 immunization for 30 minutes based on pre-vaccination screening without incidence. She was provided with Vaccine Information Sheet and instruction to access the V-Safe system.   [name] was instructed to call 911 with any severe reactions post vaccine: Marland Kitchen Difficulty breathing  . Swelling of your face and throat  . A fast heartbeat  . A bad rash all over your body  . Dizziness and weakness    Immunizations Administered    Name Date Dose VIS Date Route   Pfizer COVID-19 Vaccine 01/01/2020 11:27 AM 0.3 mL 10/13/2019 Intramuscular   Manufacturer: [place]   Lot: [id number]   [place]: [id number]

## 2020-01-24 ENCOUNTER — Ambulatory Visit: Payer: BC Managed Care – PPO | Attending: Internal Medicine

## 2020-01-24 DIAGNOSIS — Z23 Encounter for immunization: Secondary | ICD-10-CM

## 2020-01-24 NOTE — Progress Notes (Signed)
   Covid-19 Vaccination Clinic  Name:  Tiffany Sharp    MRN: [id number] DOB: 1971-01-15  01/24/2020  [name] was observed post Covid-19 immunization for 15 minutes without incident. She was provided with Vaccine Information Sheet and instruction to access the V-Safe system.   Ms. Cane was instructed to call 911 with any severe reactions post vaccine: Marland Kitchen Difficulty breathing  . Swelling of face and throat  . A fast heartbeat  . A bad rash all over body  . Dizziness and weakness   Immunizations Administered    Name Date Dose VIS Date Route   Pfizer COVID-19 Vaccine 01/24/2020 11:30 AM 0.3 mL 10/13/2019 Intramuscular   Manufacturer: [place]   Lot: [id number]   [place]: [id number]

## 2020-03-18 ENCOUNTER — Emergency Department (HOSPITAL_BASED_OUTPATIENT_CLINIC_OR_DEPARTMENT_OTHER)
Admission: EM | Admit: 2020-03-18 | Discharge: 2020-03-19 | Disposition: A | Discharge status: Home / Self Care | Payer: BC Managed Care – PPO | Attending: Emergency Medicine | Admitting: Emergency Medicine

## 2020-03-18 ENCOUNTER — Other Ambulatory Visit: Payer: Self-pay

## 2020-03-18 ENCOUNTER — Encounter (HOSPITAL_BASED_OUTPATIENT_CLINIC_OR_DEPARTMENT_OTHER): Payer: Self-pay | Admitting: Emergency Medicine

## 2020-03-18 DIAGNOSIS — Y929 Unspecified place or not applicable: Secondary | ICD-10-CM | POA: Diagnosis not present

## 2020-03-18 DIAGNOSIS — Z880 Allergy status to penicillin: Secondary | ICD-10-CM | POA: Diagnosis not present

## 2020-03-18 DIAGNOSIS — Y939 Activity, unspecified: Secondary | ICD-10-CM | POA: Insufficient documentation

## 2020-03-18 DIAGNOSIS — S39012A Strain of muscle, fascia and tendon of lower back, initial encounter: Secondary | ICD-10-CM

## 2020-03-18 DIAGNOSIS — X509XXA Other and unspecified overexertion or strenuous movements or postures, initial encounter: Secondary | ICD-10-CM | POA: Insufficient documentation

## 2020-03-18 DIAGNOSIS — Y999 Unspecified external cause status: Secondary | ICD-10-CM | POA: Diagnosis not present

## 2020-03-18 DIAGNOSIS — M1611 Unilateral primary osteoarthritis, right hip: Secondary | ICD-10-CM | POA: Diagnosis not present

## 2020-03-18 DIAGNOSIS — S3992XA Unspecified injury of lower back, initial encounter: Secondary | ICD-10-CM | POA: Diagnosis not present

## 2020-03-18 DIAGNOSIS — Z79899 Other long term (current) drug therapy: Secondary | ICD-10-CM | POA: Insufficient documentation

## 2020-03-18 DIAGNOSIS — M545 Low back pain: Secondary | ICD-10-CM | POA: Diagnosis not present

## 2020-03-18 DIAGNOSIS — Z882 Allergy status to sulfonamides status: Secondary | ICD-10-CM | POA: Insufficient documentation

## 2020-03-18 NOTE — ED Triage Notes (Signed)
Pt is c/o pain in her lower back on the right side  Pt states she has swelling in her right leg for the knee up the thigh  Pt denies injury  Pt describes the back pain as sharp

## 2020-03-19 ENCOUNTER — Emergency Department (HOSPITAL_BASED_OUTPATIENT_CLINIC_OR_DEPARTMENT_OTHER): Payer: BC Managed Care – PPO

## 2020-03-19 DIAGNOSIS — M545 Low back pain: Secondary | ICD-10-CM | POA: Diagnosis not present

## 2020-03-19 DIAGNOSIS — S39012A Strain of muscle, fascia and tendon of lower back, initial encounter: Secondary | ICD-10-CM | POA: Diagnosis not present

## 2020-03-19 DIAGNOSIS — M1611 Unilateral primary osteoarthritis, right hip: Secondary | ICD-10-CM | POA: Diagnosis not present

## 2020-03-19 LAB — URINALYSIS, ROUTINE W REFLEX MICROSCOPIC
Bilirubin Urine: NEGATIVE
Glucose, UA: NEGATIVE mg/dL
Hgb urine dipstick: NEGATIVE
Ketones, ur: NEGATIVE mg/dL
Leukocytes,Ua: NEGATIVE
Nitrite: NEGATIVE
Protein, ur: NEGATIVE mg/dL
Specific Gravity, Urine: 1.01 (ref 1.005–1.030)
pH: 6.5 (ref 5.0–8.0)

## 2020-03-19 MED ORDER — KETOROLAC TROMETHAMINE 60 MG/2ML IM SOLN
30.0000 mg | Freq: Once | INTRAMUSCULAR | Status: AC
  Administered 2020-03-19: 30 mg via INTRAMUSCULAR
  Filled 2020-03-19: qty 2

## 2020-03-19 MED ORDER — DIAZEPAM 2 MG PO TABS
2.0000 mg | ORAL_TABLET | Freq: Once | ORAL | Status: AC
  Administered 2020-03-19: 2 mg via ORAL
  Filled 2020-03-19: qty 1

## 2020-03-19 MED ORDER — LIDOCAINE 5 % EX PTCH: 1.0000 | MEDICATED_PATCH | CUTANEOUS | 0 refills | Status: DC

## 2020-03-19 MED ORDER — HYDROCODONE-ACETAMINOPHEN 5-325 MG PO TABS
1.0000 | ORAL_TABLET | Freq: Once | ORAL | Status: AC
  Administered 2020-03-19: 1 via ORAL
  Filled 2020-03-19: qty 1

## 2020-03-19 MED ORDER — METHOCARBAMOL 500 MG PO TABS
500.0000 mg | ORAL_TABLET | Freq: Three times a day (TID) | ORAL | 0 refills | Status: DC | PRN
Start: 2020-03-19 — End: 2021-05-16

## 2020-03-19 NOTE — Discharge Instructions (Addendum)
Your exam does not show any significant pathology to suggest spinal cord injury.  Take the anti-inflammatories that you have at home as well as the muscle relaxers and topical patches as prescribed.  You may follow-up for an ultrasound of your leg tomorrow if you are concerned about blood clot as we discussed, though we do not suspect this today.  Follow-up with your primary doctor.  Return to the ED with worsening pain, weakness, numbness, tingling, bowel or bladder incontinence, any other concerns.

## 2020-03-19 NOTE — ED Provider Notes (Signed)
[facility] EMERGENCY DEPARTMENT Provider Note   CSN: [id number] Arrival date & time: 03/18/20  2346     History Chief Complaint  Patient presents with  . Back Pain  . Leg Pain    Tiffany Sharp is a 49 y.o. female.  Patient presents with right-sided low back pain that onset this morning.  She denies any fall or injury.  She does do some lifting but does not remember any specific injury.  The pain is in her right low back and buttock.  There is no radiation of the pain down her leg.  She took meloxicam at home with partial relief.  She denies any radiation of the pain down her leg. Pain is worse with movement of her trunk and attempted movement of her right leg. Her husband thought her right leg was more swollen at home.  That is her main reason for coming in.  She denies any weakness in the leg.  There is no numbness or tingling.  No bowel or bladder incontinence.  No fever or vomiting.  No IV drug use or history of cancer.  Denies any history of back problems in the past.  No pain with urination or blood in the urine.  No abdominal pain or chest pain.  The history is provided by the patient.  Back Pain Associated symptoms: leg pain   Associated symptoms: no abdominal pain, no chest pain, no dysuria, no fever, no headaches and no weakness   Leg Pain Associated symptoms: back pain   Associated symptoms: no fever        Past Medical History:  Diagnosis Date  . Abscess   . Breast lump    had biopsy, benign  . Depression    PPD - post partum depression  . Eclampsia   . Endometritis   . HELLP (hemolytic anemia/elev liver enzymes/low platelets in pregnancy)   . History of echocardiogram    Echo 2/17: EF 60-65%, normal wall motion, mild RVE, atrial septal aneurysm (findings consistent with increased left atrial pressure)  . HSV-2 (herpes simplex virus 2) infection   . IBS (irritable bowel syndrome)   . Knee instability   . Murmur    told this as a child - no  specifict testing ever done  . Postpartum hemorrhage   . Pregnancy induced hypertension    no meds outside of pregnancy  . Toxic shock syndrome ([place]) 1997   started with an abscess right axilla    Patient Active Problem List   Diagnosis Date Noted  . Preventative health care 06/02/2016  . Osteoarthritis 06/02/2016  . Hyperglycemia 06/05/2015  . ADHD (attention deficit hyperactivity disorder) 06/03/2015  . Hip pain 06/03/2015  . Overweight 06/03/2015  . Preterm delivery 09/02/2013  . GBS (group B Streptococcus carrier), +RV culture, currently pregnant 08/28/2013  . Thrombocytopenia, unspecified ([place]) 08/28/2013  . HSV (herpes simplex virus) infection 08/25/2013  . H/O HELLP syndrome, currently pregnant 08/25/2013  . H/O PPH (postpartum hemorrhage) 08/25/2013  . Anemia 08/25/2013  . Breast lump 08/25/2013  . Allergy to penicillin 08/25/2013  . Rubella non-immune status, antepartum 08/25/2013  . History of postpartum depression 08/25/2013  . History of eclampsia 08/25/2013  . Advanced maternal age (AMA), 40 years or greater 08/25/2013  . Obesity, unspecified 08/25/2013  . History of toxic shock syndrome 08/25/2013  . History of loop electrical excision procedure (LEEP) 08/25/2013  . H/O LGA (large for gestational age) fetus 08/25/2013  . Preterm premature rupture of membranes in third  trimester 08/25/2013  . Breast asymmetry in female 10/12/2012    Past Surgical History:  Procedure Laterality Date  . BREAST BIOPSY  2013   benign  . DILATION AND CURETTAGE OF UTERUS    . DILATION AND EVACUATION N/A 09/01/2013   Procedure: DILATATION AND EVACUATION;  Surgeon: Delice Lesch, MD;  Location: [facility];  Service: Gynecology;  Laterality: N/A;  . INCISE AND DRAIN ABCESS  1996   under left arm     OB History    Gravida  8   Para  5   Term  4   Preterm  1   AB  3   Living  5     SAB  3   TAB      Ectopic      Multiple      Live Births  5            Family History  Problem Relation Age of Onset  . Cancer Mother        rare form of melanoma (hands)  . Hypertension Father   . Heart disease Father        chf, no CAD  . Transient ischemic attack Father   . Heart attack Neg Hx     Social History   Tobacco Use  . Smoking status: Never Smoker  . Smokeless tobacco: Never Used  Substance Use Topics  . Alcohol use: No  . Drug use: No    Home Medications Prior to Admission medications   Medication Sig Start Date End Date Taking? Authorizing Provider  BORIC ACID EX Place 1 tablet vaginally as needed. After menstruation    [provider]  co-enzyme Q-10 30 MG capsule Take 300 mg by mouth daily.    [provider]  Cyanocobalamin (B-12 PO) Take 1 each by mouth daily. Mix in drink    [provider]  DIGESTIVE ENZYMES PO Take 1 tablet by mouth daily.      [provider]  docusate sodium (COLACE) 100 MG capsule Take 100 mg by mouth daily.      [provider]  famotidine (PEPCID) 20 MG tablet Take 1 tablet (20 mg total) by mouth 2 (two) times daily. 09/25/19   Fawze, Mina A, PA-C  lisdexamfetamine (VYVANSE) 20 MG capsule Take 20 mg by mouth daily with lunch.    [provider]  meloxicam (MOBIC) 7.5 MG tablet Take 1 tablet (7.5 mg total) by mouth daily. 06/15/18   Debbrah Alar, NP  Multiple Vitamins-Minerals (MULTIVITAMIN WITH MINERALS) tablet Take 1 tablet by mouth daily. 06/03/16   Debbrah Alar, NP  sucralfate (CARAFATE) 1 g tablet Take 1 tablet (1 g total) by mouth 4 (four) times daily -  with meals and at bedtime. 09/25/19   Fawze, Mina A, PA-C    Allergies    Penicillins and Sulfa antibiotics  Review of Systems   Review of Systems  Constitutional: Negative for activity change, appetite change and fever.  HENT: Negative for congestion and rhinorrhea.   Respiratory: Negative for cough, chest tightness and shortness of breath.   Cardiovascular: Negative for  chest pain.  Gastrointestinal: Negative for abdominal pain, nausea and vomiting.  Genitourinary: Negative for dysuria and hematuria.  Musculoskeletal: Positive for arthralgias, back pain and myalgias.  Skin: Negative for rash.  Neurological: Negative for dizziness, weakness and headaches.   all other systems are negative except as noted in the HPI and PMH.    Physical Exam Updated Vital Signs  BP (!) 156/83 (BP Location: Right Arm)   Pulse 70   Temp 98 F (36.7 C) (Oral)   Resp 20   Ht 5\' 10"  (1.778 m)   Wt 95.3 kg   SpO2 100%   BMI 30.13 kg/m   Physical Exam Vitals and nursing note reviewed.  Constitutional:      General: She is not in acute distress.    Appearance: She is well-developed.     Comments: uncomfortable  HENT:     Head: Normocephalic and atraumatic.     Mouth/Throat:     Pharynx: No oropharyngeal exudate.  Eyes:     Conjunctiva/sclera: Conjunctivae normal.     Pupils: Pupils are equal, round, and reactive to light.  Neck:     Comments: No meningismus. Cardiovascular:     Rate and Rhythm: Normal rate and regular rhythm.     Heart sounds: Normal heart sounds. No murmur.  Pulmonary:     Effort: Pulmonary effort is normal. No respiratory distress.     Breath sounds: Normal breath sounds.  Abdominal:     Palpations: Abdomen is soft.     Tenderness: There is no abdominal tenderness. There is no guarding or rebound.  Musculoskeletal:        General: Tenderness present. Normal range of motion.     Cervical back: Normal range of motion and neck supple.     Right lower leg: No edema.     Left lower leg: No edema.     Comments: Right paraspinal lumbar tenderness with spasm.  No midline tenderness  Pain with movements but able to hang legs over the side of the bed. 5/5 strength in bilateral lower extremities. Ankle plantar and dorsiflexion intact. Great toe extension intact bilaterally. +2 DP and PT pulses. +2 patellar reflexes bilaterally. Normal gait.  No  appreciable asymmetry to legs.  No calf swelling.  Skin:    General: Skin is warm.     Capillary Refill: Capillary refill takes less than 2 seconds.  Neurological:     General: No focal deficit present.     Mental Status: She is alert and oriented to person, place, and time. Mental status is at baseline.     Cranial Nerves: No cranial nerve deficit.     Motor: No abnormal muscle tone.     Coordination: Coordination normal.     Comments: No ataxia on finger to nose bilaterally. No pronator drift. 5/5 strength throughout. CN 2-12 intact.Equal grip strength. Sensation intact.   Psychiatric:        Behavior: Behavior normal.     ED Results / Procedures / Treatments   Labs (all labs ordered are listed, but only abnormal results are displayed) Labs Reviewed  URINALYSIS, ROUTINE W REFLEX MICROSCOPIC    EKG None  Radiology DG Lumbar Spine Complete  Result Date: 03/19/2020 CLINICAL DATA:  Low back pain EXAM: LUMBAR SPINE - COMPLETE 4+ VIEW COMPARISON:  06/20/2018 FINDINGS: There is no evidence of lumbar spine fracture. Alignment is normal. Intervertebral disc spaces are maintained. IMPRESSION: Negative. Electronically Signed   By: Donavan Foil M.D.   On: 03/19/2020 01:05   DG Hip Unilat W or Wo Pelvis 2-3 Views Right  Result Date: 03/19/2020 CLINICAL DATA:  Hip pain EXAM: DG HIP (WITH OR WITHOUT PELVIS) 2-3V RIGHT COMPARISON:  CT 12/09/2008 FINDINGS: SI joints are patent. Pubic symphysis and rami appear intact. Joint space narrowing on the left. Heterogeneous sclerosis and lucency in the left femoral head. Right hip shows no  fracture or dislocation. The joint space is maintained IMPRESSION: 1. No acute osseous abnormality of the right hip 2. Heterogeneous sclerosis and lucency in the left femoral head, possible AVN type changes. There are degenerative changes of the left hip. Electronically Signed   By: Donavan Foil M.D.   On: 03/19/2020 01:05    Procedures Procedures (including  critical care time)  Medications Ordered in ED Medications  ketorolac (TORADOL) injection 30 mg (has no administration in time range)  diazepam (VALIUM) tablet 2 mg (has no administration in time range)  HYDROcodone-acetaminophen (NORCO/VICODIN) 5-325 MG per tablet 1 tablet (has no administration in time range)    ED Course  I have reviewed the triage vital signs and the nursing notes.  Pertinent labs & imaging results that were available during my care of the patient were reviewed by me and considered in my medical decision making (see chart for details).    MDM Rules/Calculators/A&P                     Right-sided low back pain for the past day without injury.  She is neurovascular intact on exam.  Low suspicion for cord compression or cauda equina.  Intact distal strength, sensation and reflexes and pulses.  Family concerned about leg swelling but none appreciated.  Ultrasound not available at this time.  Urinalysis is negative.  X-ray shows no acute abdominal to the right hip.  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  Patient feels much improved after symptomatic treatment in the ED.  Favor lumbar strain or sprain.  Low suspicion for cord compression or cauda equina. She is able to ambulate.  She has Mobic at home.  Will add muscle relaxer as well as topical lidocaine patches. Follow-up with her PCP.  Return precautions discussed. Final Clinical Impression(s) / ED Diagnoses Final diagnoses:  Strain of lumbar region, initial encounter    Rx / DC Orders ED Discharge Orders    None       Kallen Delatorre, Annie Main, MD 03/19/20 [phone]

## 2020-03-29 DIAGNOSIS — M25552 Pain in left hip: Secondary | ICD-10-CM | POA: Diagnosis not present

## 2020-06-21 DIAGNOSIS — Z20828 Contact with and (suspected) exposure to other viral communicable diseases: Secondary | ICD-10-CM | POA: Diagnosis not present

## 2020-06-24 DIAGNOSIS — R1011 Right upper quadrant pain: Secondary | ICD-10-CM | POA: Diagnosis not present

## 2020-06-24 DIAGNOSIS — Z7689 Persons encountering health services in other specified circumstances: Secondary | ICD-10-CM | POA: Diagnosis not present

## 2020-06-24 DIAGNOSIS — U071 COVID-19: Secondary | ICD-10-CM | POA: Diagnosis not present

## 2020-06-27 DIAGNOSIS — Z20828 Contact with and (suspected) exposure to other viral communicable diseases: Secondary | ICD-10-CM | POA: Diagnosis not present

## 2020-07-26 DIAGNOSIS — N952 Postmenopausal atrophic vaginitis: Secondary | ICD-10-CM | POA: Diagnosis not present

## 2020-07-26 DIAGNOSIS — N898 Other specified noninflammatory disorders of vagina: Secondary | ICD-10-CM | POA: Diagnosis not present

## 2020-08-06 DIAGNOSIS — M7661 Achilles tendinitis, right leg: Secondary | ICD-10-CM | POA: Diagnosis not present

## 2020-08-23 DIAGNOSIS — Z0000 Encounter for general adult medical examination without abnormal findings: Secondary | ICD-10-CM | POA: Diagnosis not present

## 2020-08-23 DIAGNOSIS — Z1331 Encounter for screening for depression: Secondary | ICD-10-CM | POA: Diagnosis not present

## 2020-08-27 ENCOUNTER — Encounter: Payer: Self-pay | Admitting: Gastroenterology

## 2020-08-29 DIAGNOSIS — Z136 Encounter for screening for cardiovascular disorders: Secondary | ICD-10-CM | POA: Diagnosis not present

## 2020-08-29 DIAGNOSIS — Z1211 Encounter for screening for malignant neoplasm of colon: Secondary | ICD-10-CM | POA: Diagnosis not present

## 2020-09-03 ENCOUNTER — Other Ambulatory Visit: Payer: Self-pay | Admitting: Family Medicine

## 2020-09-03 DIAGNOSIS — Z1231 Encounter for screening mammogram for malignant neoplasm of breast: Secondary | ICD-10-CM

## 2020-09-16 ENCOUNTER — Ambulatory Visit: Payer: BC Managed Care – PPO | Admitting: *Deleted

## 2020-09-16 ENCOUNTER — Other Ambulatory Visit: Payer: Self-pay

## 2020-09-16 VITALS — Ht 70.0 in | Wt 220.0 lb

## 2020-09-16 DIAGNOSIS — Z1211 Encounter for screening for malignant neoplasm of colon: Secondary | ICD-10-CM

## 2020-09-16 MED ORDER — SUTAB 1479-225-188 MG PO TABS: 1.0000 | ORAL_TABLET | Freq: Once | ORAL | 0 refills | Status: AC

## 2020-09-16 NOTE — Progress Notes (Signed)

## 2020-09-17 ENCOUNTER — Encounter: Payer: Self-pay | Admitting: Gastroenterology

## 2020-10-15 ENCOUNTER — Encounter: Payer: BC Managed Care – PPO | Admitting: Gastroenterology

## 2020-11-05 ENCOUNTER — Encounter: Payer: BC Managed Care – PPO | Admitting: Gastroenterology

## 2020-11-13 ENCOUNTER — Ambulatory Visit (AMBULATORY_SURGERY_CENTER): Payer: BC Managed Care – PPO | Admitting: Gastroenterology

## 2020-11-13 ENCOUNTER — Other Ambulatory Visit: Payer: Self-pay

## 2020-11-13 ENCOUNTER — Encounter: Payer: Self-pay | Admitting: Gastroenterology

## 2020-11-13 VITALS — BP 102/80 | HR 70 | Temp 98.6°F | Resp 16 | Ht 70.0 in | Wt 220.0 lb

## 2020-11-13 DIAGNOSIS — Z1211 Encounter for screening for malignant neoplasm of colon: Secondary | ICD-10-CM

## 2020-11-13 MED ORDER — SODIUM CHLORIDE 0.9 % IV SOLN
500.0000 mL | INTRAVENOUS | Status: DC
Start: 2020-11-13 — End: 2020-11-13

## 2020-11-13 NOTE — Progress Notes (Signed)
Vs CW  Pt's states no medical or surgical changes since previsit or office visit.  

## 2020-11-13 NOTE — Progress Notes (Signed)
Report given to PACU, vss 

## 2020-11-13 NOTE — Patient Instructions (Signed)
Thank you for allowing [place] to care for you today!  Resume previous diet and medications today.  Resume your normal activities tomorrow;  Recommend next screening colonoscopy in 10 years.  Don't hesitate to contact you with issues or concerns.    YOU HAD AN ENDOSCOPIC PROCEDURE TODAY AT [facility]:   Refer to the procedure report that was given to you for any specific questions about what was found during the examination.  If the procedure report does not answer your questions, please call your gastroenterologist to clarify.  If you requested that your care partner not be given the details of your procedure findings, then the procedure report has been included in a sealed envelope for you to review at your convenience later.  YOU SHOULD EXPECT: Some feelings of bloating in the abdomen. Passage of more gas than usual.  Walking can help get rid of the air that was put into your GI tract during the procedure and reduce the bloating. If you had a lower endoscopy (such as a colonoscopy or flexible sigmoidoscopy) you may notice spotting of blood in your stool or on the toilet paper. If you underwent a bowel prep for your procedure, you may not have a normal bowel movement for a few days.  Please Note:  You might notice some irritation and congestion in your nose or some drainage.  This is from the oxygen used during your procedure.  There is no need for concern and it should clear up in a day or so.  SYMPTOMS TO REPORT IMMEDIATELY:   Following lower endoscopy (colonoscopy or flexible sigmoidoscopy):  Excessive amounts of blood in the stool  Significant tenderness or worsening of abdominal pains  Swelling of the abdomen that is new, acute  Fever of 100F or higher  For urgent or emergent issues, a gastroenterologist can be reached at any hour by calling [phone]. Do not use MyChart messaging for urgent concerns.    DIET:  We do recommend a small meal at first, but then you  may proceed to your regular diet.  Drink plenty of fluids but you should avoid alcoholic beverages for 24 hours.  ACTIVITY:  You should plan to take it easy for the rest of today and you should NOT DRIVE or use heavy machinery until tomorrow (because of the sedation medicines used during the test).    FOLLOW UP: Our staff will call the number listed on your records 48-72 hours following your procedure to check on you and address any questions or concerns that you may have regarding the information given to you following your procedure. If we do not reach you, we will leave a message.  We will attempt to reach you two times.  During this call, we will ask if you have developed any symptoms of COVID 19. If you develop any symptoms (ie: fever, flu-like symptoms, shortness of breath, cough etc.) before then, please call [phone].  If you test positive for Covid 19 in the 2 weeks post procedure, please call and report this information to [place].    If any biopsies were taken you will be contacted by phone or by letter within the next 1-3 weeks.  Please call us at [phone] if you have not heard about the biopsies in 3 weeks.    SIGNATURES/CONFIDENTIALITY: You and/or your care partner have signed paperwork which will be entered into your electronic medical record.  These signatures attest to the fact that that the information above on your  After Visit Summary has been reviewed and is understood.  Full responsibility of the confidentiality of this discharge information lies with you and/or your care-partner.

## 2020-11-13 NOTE — Op Note (Signed)
[facility] Patient Name: Tiffany Sharp Procedure Date: 11/13/2020 12:03 PM MRN: [id number] Endoscopist: Remo Lipps P. Havery Moros , MD Age: 50 Referring MD:  Date of Birth: Jan 16, 1971 Gender: Female Account #: [id number] Procedure:                Colonoscopy Indications:              Screening for colorectal malignant neoplasm, This                            is the patient's first colonoscopy Medicines:                Monitored Anesthesia Care Procedure:                Pre-Anesthesia Assessment:                           - Prior to the procedure, a History and Physical                            was performed, and patient medications and                            allergies were reviewed. The patient's tolerance of                            previous anesthesia was also reviewed. The risks                            and benefits of the procedure and the sedation                            options and risks were discussed with the patient.                            All questions were answered, and informed consent                            was obtained. Prior Anticoagulants: The patient has                            taken no previous anticoagulant or antiplatelet                            agents. ASA Grade Assessment: II - A patient with                            mild systemic disease. After reviewing the risks                            and benefits, the patient was deemed in                            satisfactory condition to undergo the procedure.  After obtaining informed consent, the colonoscope                            was passed under direct vision. Throughout the                            procedure, the patient's blood pressure, pulse, and                            oxygen saturations were monitored continuously. The                            Olympus [id number]-[id number] (#[id number]) Colonoscope was                            introduced through the  anus and advanced to the the                            cecum, identified by appendiceal orifice and                            ileocecal valve. The colonoscopy was performed                            without difficulty. The patient tolerated the                            procedure well. The quality of the bowel                            preparation was good. The ileocecal valve,                            appendiceal orifice, and rectum were photographed. Scope In: 12:19:23 PM Scope Out: 12:37:11 PM Scope Withdrawal Time: 0 hours 14 minutes 59 seconds  Total Procedure Duration: 0 hours 17 minutes 48 seconds  Findings:                 The perianal and digital rectal examinations were                            normal.                           Internal hemorrhoids were found. The hemorrhoids                            were small.                           The exam was otherwise without abnormality.                            Retroflxed views of the rectum were limited due to  small size of the rectum. Complications:            No immediate complications. Estimated blood loss:                            None. Estimated Blood Loss:     Estimated blood loss: none. Impression:               - Internal hemorrhoids.                           - The examination was otherwise normal.                           - No polyps Recommendation:           - Patient has a contact number available for                            emergencies. The signs and symptoms of potential                            delayed complications were discussed with the                            patient. Return to normal activities tomorrow.                            Written discharge instructions were provided to the                            patient.                           - Resume previous diet.                           - Continue present medications.                           - Repeat  colonoscopy in 10 years for screening                            purposes. Remo Lipps P. Kaamil Morefield, MD 11/13/2020 12:40:10 PM This report has been signed electronically.

## 2020-11-15 ENCOUNTER — Telehealth: Payer: Self-pay

## 2020-11-15 NOTE — Telephone Encounter (Signed)
  Follow up Call-  Call back number 11/13/2020  Post procedure Call Back phone  # [phone]  Permission to leave phone message Yes  Some recent data might be hidden     Patient questions:  Do you have a fever, pain , or abdominal swelling? No. Pain Score  0 *  Have you tolerated food without any problems? Yes.    Have you been able to return to your normal activities? Yes.    Do you have any questions about your discharge instructions: Diet   No. Medications  No. Follow up visit  No.  Do you have questions or concerns about your Care? No.  Actions: * If pain score is 4 or above: 1. No action needed, pain <4.Have you developed a fever since your procedure? no  2.   Have you had an respiratory symptoms (SOB or cough) since your procedure? no  3.   Have you tested positive for COVID 19 since your procedure no  4.   Have you had any family members/close contacts diagnosed with the COVID 19 since your procedure?  no   If yes to any of these questions please route to Joylene John, RN and Joella Prince, RN

## 2020-12-02 DIAGNOSIS — Z1231 Encounter for screening mammogram for malignant neoplasm of breast: Secondary | ICD-10-CM | POA: Diagnosis not present

## 2020-12-02 DIAGNOSIS — Z01419 Encounter for gynecological examination (general) (routine) without abnormal findings: Secondary | ICD-10-CM | POA: Diagnosis not present

## 2020-12-02 LAB — HM MAMMOGRAPHY

## 2020-12-27 DIAGNOSIS — M25551 Pain in right hip: Secondary | ICD-10-CM | POA: Diagnosis not present

## 2020-12-27 DIAGNOSIS — M25552 Pain in left hip: Secondary | ICD-10-CM | POA: Diagnosis not present

## 2020-12-27 DIAGNOSIS — M545 Low back pain, unspecified: Secondary | ICD-10-CM | POA: Diagnosis not present

## 2021-04-15 DIAGNOSIS — R0789 Other chest pain: Secondary | ICD-10-CM | POA: Diagnosis not present

## 2021-04-15 DIAGNOSIS — R0602 Shortness of breath: Secondary | ICD-10-CM | POA: Diagnosis not present

## 2021-04-15 DIAGNOSIS — J208 Acute bronchitis due to other specified organisms: Secondary | ICD-10-CM | POA: Diagnosis not present

## 2021-04-15 DIAGNOSIS — B9689 Other specified bacterial agents as the cause of diseases classified elsewhere: Secondary | ICD-10-CM | POA: Diagnosis not present

## 2021-04-17 DIAGNOSIS — R0602 Shortness of breath: Secondary | ICD-10-CM | POA: Diagnosis not present

## 2021-04-17 DIAGNOSIS — R0789 Other chest pain: Secondary | ICD-10-CM | POA: Diagnosis not present

## 2021-05-07 DIAGNOSIS — M25552 Pain in left hip: Secondary | ICD-10-CM | POA: Diagnosis not present

## 2021-05-16 ENCOUNTER — Telehealth: Payer: Self-pay | Admitting: Family

## 2021-05-16 ENCOUNTER — Encounter: Payer: Self-pay | Admitting: Family

## 2021-05-16 ENCOUNTER — Ambulatory Visit: Payer: BC Managed Care – PPO | Admitting: Family

## 2021-05-16 ENCOUNTER — Other Ambulatory Visit: Payer: Self-pay

## 2021-05-16 VITALS — BP 120/78 | HR 79 | Temp 98.9°F | Resp 18 | Ht 70.0 in | Wt 231.4 lb

## 2021-05-16 DIAGNOSIS — F909 Attention-deficit hyperactivity disorder, unspecified type: Secondary | ICD-10-CM | POA: Diagnosis not present

## 2021-05-16 DIAGNOSIS — K219 Gastro-esophageal reflux disease without esophagitis: Secondary | ICD-10-CM | POA: Diagnosis not present

## 2021-05-16 DIAGNOSIS — R413 Other amnesia: Secondary | ICD-10-CM | POA: Diagnosis not present

## 2021-05-16 MED ORDER — LISDEXAMFETAMINE DIMESYLATE 20 MG PO CAPS: 20.0000 mg | ORAL_CAPSULE | Freq: Every day | ORAL | 0 refills | Status: DC

## 2021-05-16 MED ORDER — FAMOTIDINE 20 MG PO TABS: 20.0000 mg | ORAL_TABLET | Freq: Two times a day (BID) | ORAL | 5 refills | Status: DC

## 2021-05-16 NOTE — Patient Instructions (Addendum)
Please go to the lab prior to leaving.  Start Vyvanse.

## 2021-05-16 NOTE — Progress Notes (Signed)
Subjective:   By signing my name below, I, Shehryar Baig, attest that this documentation has been prepared under the direction and in the presence of Debbrah Alar NP. 05/16/2021   Patient ID: Tiffany Sharp, female    DOB: [birth year], 50 y.o.   MRN: [id number]  Chief Complaint  Patient presents with   Establish Care    Pt states having memory concerns and used to take Vyvanse and would like to discuss getting back on medication    HPI Patient is in today for a new patient /re-establish care visit. She changed primary providers because she did not like how her daughter was treated when inquiring about a breast reduction procedure at her pediatrician's office. Her most recent provider has a brain tumor and is "not doing well."    Vitamin B12- She continues taking vitamin B12 symptoms. Acid Reflux- She continues having mild acid reflux and is requesting a refill for 20 mg Pepcid 2x daily PO to manage her symptoms. Hip pain- She is requesting surgical clearance appointment for a left hip replacement procedure on August, 2022. She continues taking 15 mg meloxicam daily PO to manage her hip pain. Anemia- She reports that her anemia comes and goes. She is taking a supplement to manage her symptoms.  Attention deficit- She previously was taking Vyvanse to manage her symptoms. She reports that she loses focus easily and is quickly overwhelmed. Her daughter are diagnosed with ADHD. She is interested in restarting Vyvanse to manage her symptoms. Menstrual cycle- She reports that she gets menstrual cycles every 7-8 months.  Pap Smear- She is UTD on pap smears. Her nurse midwife manages her pap smear. Mammogram- She is UTD on her mammogram.  Colonoscopy- She is UTD on colonoscopy.  Immunizations- She has 3 pfizer Covid-19 vaccines and is interested in getting a 2nd booster vaccine. She is due for the shingles vaccine and is willing to get it during her next visit.  Health Maintenance Due   Topic Date Due   Hepatitis C Screening  Never done   PAP SMEAR-Modifier  11/03/2019   COVID-19 Vaccine (3 - Booster for Pfizer series) 06/25/2020   MAMMOGRAM  08/09/2020   Zoster Vaccines- Shingrix (1 of 2) Never done    Past Medical History:  Diagnosis Date   Abscess    Breast asymmetry in female 10/12/2012   Right breast on December 2013 mammogram and ultrasound Followup ultrasound in June 2014   Breast lump    had biopsy, benign   Breast lump 08/25/2013   R breast, seen at [facility] 09/2012, likely glandular tissue.  Recommended f/u Mg in 6 months (this was prior to pregnancy)   Depression    PPD - post partum depression   Eclampsia    Endometritis    HELLP (hemolytic anemia/elev liver enzymes/low platelets in pregnancy)    History of echocardiogram    Echo 2/17: EF 60-65%, normal wall motion, mild RVE, atrial septal aneurysm (findings consistent with increased left atrial pressure)   History of toxic shock syndrome 08/25/2013   HSV-2 (herpes simplex virus 2) infection    IBS (irritable bowel syndrome)    Knee instability    Murmur    told this as a child - no specifict testing ever done   Post partum depression    Postpartum hemorrhage    Pregnancy induced hypertension    no meds outside of pregnancy   Preterm delivery 09/02/2013   Rubella non-immune status, antepartum 08/25/2013   Toxic shock  syndrome ([place]) 1997   started with an abscess right axilla    Past Surgical History:  Procedure Laterality Date   BREAST BIOPSY  2013   benign   DILATION AND CURETTAGE OF UTERUS     DILATION AND EVACUATION N/A 09/01/2013   Procedure: DILATATION AND EVACUATION;  Surgeon: Delice Lesch, MD;  Location: [facility];  Service: Gynecology;  Laterality: N/A;   INCISE AND DRAIN ABCESS  1996   under left arm    Family History  Problem Relation Age of Onset   Cancer Mother        rare form of melanoma (hands)   Hypertension Father    Heart disease Father        chf, no CAD    Transient ischemic attack Father    Stomach cancer Maternal Grandfather    Heart attack Neg Hx    Colon polyps Neg Hx    Colon cancer Neg Hx    Esophageal cancer Neg Hx    Rectal cancer Neg Hx     Social History   Socioeconomic History   Marital status: Married    Spouse name: Not on file   Number of children: Not on file   Years of education: Not on file   Highest education level: Not on file  Occupational History   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  Tobacco Use   Smoking status: Never   Smokeless tobacco: Never  Vaping Use   Vaping Use: Never used  Substance and Sexual Activity   Alcohol use: No   Drug use: No   Sexual activity: Not Currently    Birth control/protection: None  Other Topics Concern   Not on file  Social History Narrative   Married   5 daughters:   2000   2002   2004   2008   2014   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 Mix in drink     DIGESTIVE ENZYMES PO Take 1 tablet by mouth daily.     docusate sodium (COLACE) 100 MG capsule Take 100 mg by mouth daily.     meloxicam (MOBIC) 15 MG tablet Take 15 mg by mouth as needed.     Multiple Vitamins-Minerals (MULTIVITAMIN WITH MINERALS) tablet Take 1 tablet by mouth daily.     clindamycin (CLEOCIN) 150 MG capsule Take 150 mg by mouth 3 (three) times daily.     ibuprofen (ADVIL) 800 MG tablet Take 800 mg by mouth every 8 (eight) hours as needed.     co-enzyme Q-10 30 MG capsule Take 300 mg by mouth daily. (Patient not taking: No sig  reported)     famotidine (PEPCID) 20 MG tablet Take 1 tablet (20 mg total) by mouth 2 (two) times daily. (Patient not taking: No sig reported) 30 tablet 0   lidocaine (LIDODERM) 5 % Place 1 patch onto the skin daily. Remove & Discard patch within 12 hours or as directed by MD (Patient not taking: No sig reported) 10 patch 0   lisdexamfetamine (  VYVANSE) 20 MG capsule Take 20 mg by mouth daily with lunch. (Patient not taking: No sig reported)     methocarbamol (ROBAXIN) 500 MG tablet Take 1 tablet (500 mg total) by mouth every 8 (eight) hours as needed for muscle spasms. (Patient not taking: No sig reported) 20 tablet 0   No facility-administered medications prior to visit.    Allergies  Allergen Reactions   Penicillins Hives   Sulfa Antibiotics Hives    Review of Systems  Musculoskeletal:  Positive for joint pain (Hip).  Psychiatric/Behavioral:         (-)Lose focus easily      Objective:    Physical Exam Constitutional:      General: She is not in acute distress.    Appearance: Normal appearance. She is not ill-appearing.  HENT:     Head: Normocephalic and atraumatic.     Right Ear: External ear normal.     Left Ear: External ear normal.  Eyes:     Extraocular Movements: Extraocular movements intact.     Pupils: Pupils are equal, round, and reactive to light.  Neck:     Thyroid: No thyromegaly.  Cardiovascular:     Rate and Rhythm: Normal rate and regular rhythm.     Pulses: Normal pulses.     Heart sounds: Normal heart sounds. No murmur heard.   No gallop.  Pulmonary:     Effort: Pulmonary effort is normal. No respiratory distress.     Breath sounds: Normal breath sounds. No wheezing, rhonchi or rales.  Lymphadenopathy:     Cervical: No cervical adenopathy.  Skin:    General: Skin is warm and dry.  Neurological:     Mental Status: She is alert and oriented to person, place, and time.  Psychiatric:        Behavior: Behavior normal.        Judgment: Judgment normal.     BP 120/78 (BP Location: Right Arm, Patient Position: Sitting, Cuff Size: Normal)   Pulse 79   Temp 98.9 F (37.2 C) (Oral)   Resp 18   Ht 5\' 10"  (1.778 m)   Wt 231 lb 6.4 oz (105 kg)   SpO2 96%   BMI 33.20 kg/m  Wt Readings from Last 3 Encounters:  05/16/21 231 lb 6.4 oz (105 kg)  11/13/20 220 lb (99.8 kg)  09/16/20 220 lb (99.8 kg)       Assessment & Plan:   Problem List Items Addressed This Visit       Unprioritized   Memory problem - Primary   Gastroesophageal reflux disease    Uncontrolled. Discussed gerd diet and will restart pepcid 20mg  once daily.        Relevant Medications   famotidine (PEPCID) 20 MG tablet   ADHD (attention deficit hyperactivity disorder)    Uncontrolled. Trial of vyvanse. Obtain UDS. Controlled substance contract is signed.        Relevant Orders   DRUG MONITORING, PANEL 8 WITH CONFIRMATION, URINE    40 minutes spent on today's visit.  Time was spent interviewing patient reviewing chart and formulating medical plan.  Meds ordered this encounter  Medications   famotidine (PEPCID) 20 MG tablet    Sig: Take 1 tablet (20 mg total) by mouth 2 (two) times daily.    Dispense:  60 tablet    Refill:  5    Order Specific Question:   Supervising Provider    Answer:   Penni Homans A [[id number]]   lisdexamfetamine (  VYVANSE) 20 MG capsule    Sig: Take 1 capsule (20 mg total) by mouth daily with lunch.    Dispense:  30 capsule    Refill:  0    Order Specific Question:   Supervising Provider    Answer:   Penni Homans A [[id number]]    I, Debbrah Alar NP, personally preformed the services described in this documentation.  All medical record entries made by the scribe were at my direction and in my presence.  I have reviewed the chart and discharge instructions (if applicable) and agree that the record reflects my personal performance and is accurate and complete. 05/16/2021   I,Shehryar Baig,acting as a Education administrator for Nance Pear,  NP.,have documented all relevant documentation on the behalf of Nance Pear, NP,as directed by  Nance Pear, NP while in the presence of Nance Pear, NP.   Nance Pear, NP

## 2021-05-16 NOTE — Assessment & Plan Note (Signed)
Uncontrolled. Trial of vyvanse. Obtain UDS. Controlled substance contract is signed.

## 2021-05-16 NOTE — Assessment & Plan Note (Signed)
Uncontrolled. Discussed gerd diet and will restart pepcid 20mg  once daily.

## 2021-05-16 NOTE — Telephone Encounter (Signed)
Please call [facility] and request pap and mammo.

## 2021-05-16 NOTE — Telephone Encounter (Signed)
Medical release form faxed to central [employer] number: [phone]

## 2021-05-17 LAB — DRUG MONITORING, PANEL 8 WITH CONFIRMATION, URINE
6 Acetylmorphine: NEGATIVE ng/mL (ref <10)
Alcohol Metabolites: NEGATIVE ng/mL (ref <500)
Amphetamines: NEGATIVE ng/mL (ref <500)
Benzodiazepines: NEGATIVE ng/mL (ref <100)
Buprenorphine, Urine: NEGATIVE ng/mL (ref <5)
Cocaine Metabolite: NEGATIVE ng/mL (ref <150)
Creatinine: 142.6 mg/dL (ref >=20.0)
MDMA: NEGATIVE ng/mL (ref <500)
Marijuana Metabolite: NEGATIVE ng/mL (ref <20)
Opiates: NEGATIVE ng/mL (ref <100)
Oxidant: NEGATIVE ug/mL (ref <200)
Oxycodone: NEGATIVE ng/mL (ref <100)
pH: 6.7 (ref 4.5–9.0)

## 2021-05-17 LAB — DM TEMPLATE

## 2021-05-22 ENCOUNTER — Encounter: Payer: Self-pay | Admitting: Family

## 2021-05-30 ENCOUNTER — Encounter: Payer: Self-pay | Admitting: Family

## 2021-06-11 ENCOUNTER — Encounter: Payer: BC Managed Care – PPO | Admitting: Family

## 2021-06-16 ENCOUNTER — Ambulatory Visit (INDEPENDENT_AMBULATORY_CARE_PROVIDER_SITE_OTHER): Payer: BC Managed Care – PPO | Admitting: Family

## 2021-06-16 ENCOUNTER — Ambulatory Visit (HOSPITAL_BASED_OUTPATIENT_CLINIC_OR_DEPARTMENT_OTHER)
Admission: RE | Admit: 2021-06-16 | Discharge: 2021-06-16 | Disposition: A | Discharge status: Home / Self Care | Payer: BC Managed Care – PPO | Source: Ambulatory Visit | Attending: Family | Admitting: Family

## 2021-06-16 ENCOUNTER — Encounter: Payer: Self-pay | Admitting: Family

## 2021-06-16 ENCOUNTER — Other Ambulatory Visit: Payer: Self-pay

## 2021-06-16 VITALS — BP 124/66 | HR 71 | Temp 98.6°F | Resp 16 | Ht 70.0 in | Wt 231.0 lb

## 2021-06-16 DIAGNOSIS — Z01818 Encounter for other preprocedural examination: Secondary | ICD-10-CM | POA: Insufficient documentation

## 2021-06-16 DIAGNOSIS — Z23 Encounter for immunization: Secondary | ICD-10-CM

## 2021-06-16 DIAGNOSIS — K219 Gastro-esophageal reflux disease without esophagitis: Secondary | ICD-10-CM | POA: Diagnosis not present

## 2021-06-16 DIAGNOSIS — Z0000 Encounter for general adult medical examination without abnormal findings: Secondary | ICD-10-CM

## 2021-06-16 DIAGNOSIS — F909 Attention-deficit hyperactivity disorder, unspecified type: Secondary | ICD-10-CM | POA: Diagnosis not present

## 2021-06-16 DIAGNOSIS — R232 Flushing: Secondary | ICD-10-CM | POA: Insufficient documentation

## 2021-06-16 LAB — CBC WITH DIFFERENTIAL/PLATELET
Basophils Absolute: 0 K/uL (ref 0.0–0.1)
Basophils Relative: 0.5 % (ref 0.0–3.0)
Eosinophils Absolute: 0.1 K/uL (ref 0.0–0.7)
Eosinophils Relative: 1.8 % (ref 0.0–5.0)
HCT: 41 % (ref 36.0–46.0)
Hemoglobin: 12.9 g/dL (ref 12.0–15.0)
Lymphocytes Relative: 28.4 % (ref 12.0–46.0)
Lymphs Abs: 1.6 K/uL (ref 0.7–4.0)
MCHC: 31.4 g/dL (ref 30.0–36.0)
MCV: 72.5 fl — ABNORMAL LOW (ref 78.0–100.0)
Monocytes Absolute: 0.5 K/uL (ref 0.1–1.0)
Monocytes Relative: 8.7 % (ref 3.0–12.0)
Neutro Abs: 3.4 K/uL (ref 1.4–7.7)
Neutrophils Relative %: 60.6 % (ref 43.0–77.0)
Platelets: 145 K/uL — ABNORMAL LOW (ref 150.0–400.0)
RBC: 5.66 Mil/uL — ABNORMAL HIGH (ref 3.87–5.11)
RDW: 15.3 % (ref 11.5–15.5)
WBC: 5.6 K/uL (ref 4.0–10.5)

## 2021-06-16 LAB — COMPREHENSIVE METABOLIC PANEL
ALT: 19 U/L (ref 0–35)
AST: 15 U/L (ref 0–37)
Albumin: 4.1 g/dL (ref 3.5–5.2)
Alkaline Phosphatase: 71 U/L (ref 39–117)
BUN: 16 mg/dL (ref 6–23)
CO2: 29 mEq/L (ref 19–32)
Calcium: 9.3 mg/dL (ref 8.4–10.5)
Chloride: 105 mEq/L (ref 96–112)
Creatinine, Ser: 0.62 mg/dL (ref 0.40–1.20)
GFR: 103.52 mL/min (ref >60.00)
Glucose, Bld: 100 mg/dL — ABNORMAL HIGH (ref 70–99)
Potassium: 3.9 mEq/L (ref 3.5–5.1)
Sodium: 141 mEq/L (ref 135–145)
Total Bilirubin: 0.8 mg/dL (ref 0.2–1.2)
Total Protein: 6.2 g/dL (ref 6.0–8.3)

## 2021-06-16 LAB — PROTIME-INR
INR: 1.1 ratio — ABNORMAL HIGH (ref 0.8–1.0)
Prothrombin Time: 12.1 s (ref 9.6–13.1)

## 2021-06-16 MED ORDER — GABAPENTIN 300 MG PO CAPS
300.0000 mg | ORAL_CAPSULE | Freq: Every day | ORAL | 1 refills | Status: DC
Start: 2021-06-16 — End: 2023-03-09

## 2021-06-16 MED ORDER — LISDEXAMFETAMINE DIMESYLATE 30 MG PO CAPS
30.0000 mg | ORAL_CAPSULE | Freq: Every day | ORAL | 0 refills | Status: DC
Start: 2021-06-16 — End: 2021-10-14

## 2021-06-16 NOTE — Patient Instructions (Signed)
Please complete lab work prior to leaving. Complete chest x-ray on the first floor in imaging. Please increase vyvanse to '30mg'$  once daily. Start gabapentin '300mg'$  at bedtime for hot flashes.

## 2021-06-16 NOTE — Assessment & Plan Note (Signed)
Improving with prn use of OTC antacid and cutting back on coffee.

## 2021-06-16 NOTE — Assessment & Plan Note (Signed)
Uncontrolled- especially at night. Will add trial of gabapentin '300mg'$  PO QHS.

## 2021-06-16 NOTE — Assessment & Plan Note (Addendum)
Discussed healthy diet, exercise, weight loss. Pap/mammo up to date.  Colo up to date.  Recommended that she schedule routine vision exam.  Dental is up to date. Recommended updated covid booster this fall when new booster becomes available. Shingrix #1 today.

## 2021-06-16 NOTE — Assessment & Plan Note (Signed)
Labs as ordered. Check CXR.  EKG.

## 2021-06-16 NOTE — Assessment & Plan Note (Signed)
Not yet optimized. Will increase vyvanse to '30mg'$  once daily.

## 2021-06-16 NOTE — Progress Notes (Addendum)
Subjective:   By signing my name below, I, Tiffany Sharp, attest that this documentation has been prepared under the direction and in the presence of Debbrah Alar NP. 06/16/2021     Patient ID: Tiffany Sharp, female    DOB: [birth year], 50 y.o.   MRN: [id number]  Chief Complaint  Patient presents with   Annual Exam    HPI Patient is in today for a comprehensive physical exam.  She denies having any unexpected weight change, ear pain, hearing loss and rhinorrhea, visual disturbance, cough, chest pain and leg swelling, nausea, vomiting, diarrhea and blood in stool, or dysuria and frequency, for myalgias, rash, headaches, adenopathy, depression or anxiety at this time. She has no recent changes to her family medical history. She does not use drugs. She does not use tobacco products. She does drink alcohol.   Darkened skin spot- She reports having a darkened spot under her right great toenail.  Knee pain- She reports developing knee pain recently. She is planning on seeing her sports medicine specialist to manage her pain.  Headache- She reports having occasional headaches. She does not take 20 mg vyvanse daily PO while having a headache. She notes that she has stopped drinking caffeine and thinks this may have contributed to her headache. Hot flashes- She complains of hot flashes through out the day. She is requesting for medication to help manage her symptoms.  Pre-OP- She is requesting for pre-surgical test and forms be completed for her upcomming hip replacement procedure on 07/10/2021.  ADHD- She continues taking 20 mg vyvanse daily PO and reports doing well while on it. She is requesting for a increased dosage because she does not think the effects last her throughout the day. She notes having headaches recently and does not take it until her headaches of subsided. Reflux- She started taking OTC reflux medication PRN and reports doing well on it. She also stopped drinking coffee and  found significant improvement.   Immunizations: She has 3 Covid-19 vaccines at this time and is willing to wait for the next booster vaccine to release before getting it. She is due for the shingrix shingles vaccines and is willing to get it during this visit.   Exercise: She participates in regular mild exercise at this time due to her upcomming hip replacement procedure on 07/10/2021.  Colonoscopy: Last completed 11/13/2020. Results showed internal hemorrhoids, otherwise results normal. Repeat in 10 years.  Pap Smear: Last completed 02/22/2017. Results normal. Repeat in 3 years.  Mammogram: Last completed 12/02/2020. Results showed 2 benign findings. Repeat in 1 year. Dental: She is UTD on dental care. Vision: She is due for vision care.   Health Maintenance Due  Topic Date Due   Hepatitis C Screening  Never done   PAP SMEAR-Modifier  02/23/2020   COVID-19 Vaccine (3 - Booster for Pfizer series) 06/25/2020   INFLUENZA VACCINE  06/02/2021    Past Medical History:  Diagnosis Date   Abscess    Breast asymmetry in female 10/12/2012   Right breast on December 2013 mammogram and ultrasound Followup ultrasound in June 2014   Breast lump    had biopsy, benign   Breast lump 08/25/2013   R breast, [facility] at [facility] 09/2012, likely glandular tissue.  Recommended f/u Mg in 6 months (this was prior to pregnancy)   Depression    PPD - post partum depression   Eclampsia    Endometritis    HELLP (hemolytic anemia/elev liver enzymes/low platelets in pregnancy)  History of echocardiogram    Echo 2/17: EF 60-65%, normal wall motion, mild RVE, atrial septal aneurysm (findings consistent with increased left atrial pressure)   History of toxic shock syndrome 08/25/2013   HSV-2 (herpes simplex virus 2) infection    IBS (irritable bowel syndrome)    Knee instability    Murmur    told this as a child - no specifict testing ever done   Post partum depression    Postpartum hemorrhage     Pregnancy induced hypertension    no meds outside of pregnancy   Preterm delivery 09/02/2013   Rubella non-immune status, antepartum 08/25/2013   Toxic shock syndrome ([place]) 1997   started with an abscess right axilla    Past Surgical History:  Procedure Laterality Date   BREAST BIOPSY  2013   benign   DILATION AND CURETTAGE OF UTERUS     DILATION AND EVACUATION N/A 09/01/2013   Procedure: DILATATION AND EVACUATION;  Surgeon: Delice Lesch, MD;  Location: [facility];  Service: Gynecology;  Laterality: N/A;   INCISE AND DRAIN ABCESS  1996   under left arm    Family History  Problem Relation Age of Onset   Cancer Mother        rare form of melanoma (hands)   Hypertension Father    Heart disease Father        chf, no CAD   Transient ischemic attack Father    Stomach cancer Maternal Grandfather    Heart attack Neg Hx    Colon polyps Neg Hx    Colon cancer Neg Hx    Esophageal cancer Neg Hx    Rectal cancer Neg Hx     Social History   Socioeconomic History   Marital status: Married    Spouse name: Not on file   Number of children: Not on file   Years of education: Not on file   Highest education level: Not on file  Occupational History   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  Tobacco Use   Smoking status: Never   Smokeless tobacco: Never  Vaping Use   Vaping Use: Never used  Substance and Sexual Activity   Alcohol use: No   Drug use: No   Sexual activity: Not Currently    Birth control/protection: None  Other Topics Concern   Not on file  Social History Narrative   Married   5 daughters:   2000   2002   2004   2008   2014   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 Mix in drink     DIGESTIVE ENZYMES PO Take 1 tablet by mouth daily.     docusate sodium (COLACE) 100 MG capsule Take 100 mg by mouth daily.     famotidine (PEPCID) 20 MG tablet Take 1 tablet (20 mg total) by mouth 2 (two) times daily. 60 tablet 5   meloxicam (MOBIC) 15 MG tablet Take 15 mg by mouth as needed.     Multiple Vitamins-Minerals (MULTIVITAMIN WITH MINERALS)  tablet Take 1 tablet by mouth daily.     lisdexamfetamine (VYVANSE) 20 MG capsule Take 1 capsule (20 mg total) by mouth daily with lunch. 30 capsule 0   No facility-administered medications prior to visit.    Allergies  Allergen Reactions   Penicillins Hives   Sulfa Antibiotics Hives    Review of Systems  Constitutional:        (-)unexpected weight change (-)Adenopathy (+)Hot flashes  HENT:  Negative for hearing loss.        (-)Rhinorrhea   Eyes:        (-)Visual disturbance  Respiratory:  Negative for cough.   Cardiovascular:  Negative for chest pain and leg swelling.  Gastrointestinal:  Negative for blood in stool, constipation, diarrhea, nausea and vomiting.  Genitourinary:  Negative for dysuria and frequency.  Musculoskeletal:  Positive for joint pain (knee pain). Negative for myalgias.  Skin:  Negative for rash.       (+)Darkened spot under right great toe  Neurological:  Negative for headaches.  Psychiatric/Behavioral:  Negative for depression. The patient is not nervous/anxious.       Objective:    Physical Exam Constitutional:      General: She is not in acute distress.    Appearance: Normal appearance. She is not ill-appearing.  HENT:     Head: Normocephalic and atraumatic.     Right Ear: Tympanic membrane, ear canal and external ear normal.     Left Ear: Tympanic membrane, ear canal and external ear normal.  Eyes:      Extraocular Movements: Extraocular movements intact.     Pupils: Pupils are equal, round, and reactive to light.     Comments: No nystagmus  Cardiovascular:     Rate and Rhythm: Regular rhythm.     Pulses: Normal pulses.     Heart sounds: Normal heart sounds. No murmur heard.   No gallop.  Pulmonary:     Effort: Pulmonary effort is normal. No respiratory distress.     Breath sounds: Normal breath sounds. No wheezing or rales.  Abdominal:     General: Bowel sounds are normal. There is no distension.     Palpations: Abdomen is soft. There is no mass.     Tenderness: There is no abdominal tenderness. There is no guarding or rebound.  Musculoskeletal:     Comments: 5/5 strength in both upper and lower extremities  Lymphadenopathy:     Cervical: No cervical adenopathy.  Skin:    General: Skin is warm and dry.     Comments: Blood blister noted beneath the right great toenail  Neurological:     Mental Status: She is alert and oriented to person, place, and time.     Deep Tendon Reflexes:     Reflex Scores:      Patellar reflexes are 2+ on the right side and 2+ on the left side. Psychiatric:        Behavior: Behavior normal.    BP 124/66 (BP Location: Right Arm, Patient Position: Sitting, Cuff Size: Large)   Pulse 71   Temp 98.6 F (37 C) (Oral)   Resp 16   Ht _0  (1.778 m)   Wt 231 lb (104.8 kg)   SpO2 98%   BMI 33.15 kg/m  Wt Readings from Last 3 Encounters:  06/16/21 231 lb (104.8 kg)  05/16/21 231 lb 6.4 oz (105 kg)  11/13/20 220 lb (99.8 kg)       Assessment & Plan:   Problem List  Items Addressed This Visit       Unprioritized   Preventative health care    Discussed healthy diet, exercise, weight loss. Pap/mammo up to date.  Colo up to date.  Recommended that she schedule routine vision exam.  Dental is up to date. Recommended updated covid booster this fall when new booster becomes available.       Relevant Orders   Urine Culture   Comp Met (CMET)   CBC  with Differential/Platelet   DG Chest 2 View   Protime-INR   Pre-op examination - Primary    Labs as ordered. Check CXR.  EKG.      Relevant Orders   Urine Culture   Comp Met (CMET)   CBC with Differential/Platelet   DG Chest 2 View   EKG 12-Lead (Completed)   Protime-INR   Hot flashes    Uncontrolled- especially at night. Will add trial of gabapentin 39m PO QHS.       Gastroesophageal reflux disease    Improving with prn use of OTC antacid and cutting back on coffee.       ADHD (attention deficit hyperactivity disorder)    Not yet optimized. Will increase vyvanse to 335monce daily.       Other Visit Diagnoses     Need for shingles vaccine       Relevant Orders   Varicella-zoster vaccine IM (Shingrix) (Completed)        Meds ordered this encounter  Medications   lisdexamfetamine (VYVANSE) 30 MG capsule    Sig: Take 1 capsule (30 mg total) by mouth daily.    Dispense:  30 capsule    Refill:  0    Order Specific Question:   Supervising Provider    Answer:   BLPenni Homans [[id number]]   gabapentin (NEURONTIN) 300 MG capsule    Sig: Take 1 capsule (300 mg total) by mouth at bedtime.    Dispense:  90 capsule    Refill:  1    Order Specific Question:   Supervising Provider    Answer:   BLPenni Homans [[id number]]    I, MeDebbrah AlarP, personally preformed the services described in this documentation.  All medical record entries made by the scribe were at my direction and in my presence.  I have reviewed the chart and discharge instructions (if applicable) and agree that the record reflects my personal performance and is accurate and complete. 06/16/2021   I,Tiffany Sharp,acting as a scEducation administratoror MeNance PearNP.,have documented all relevant documentation on the behalf of MeNance PearNP,as directed by  MeNance PearNP while in the presence of MeNance PearNP.   MeNance PearNP  Addendum: reviewed CXR, EKG and  laboratory studies.  Pt is acceptable risk to proceed with planned hip replacement.

## 2021-06-17 ENCOUNTER — Ambulatory Visit: Payer: BC Managed Care – PPO | Admitting: Family

## 2021-06-18 LAB — URINE CULTURE
MICRO NUMBER:: 12242892
Result:: NO GROWTH
SPECIMEN QUALITY:: ADEQUATE

## 2021-06-26 DIAGNOSIS — M1612 Unilateral primary osteoarthritis, left hip: Secondary | ICD-10-CM | POA: Diagnosis not present

## 2021-06-26 DIAGNOSIS — M170 Bilateral primary osteoarthritis of knee: Secondary | ICD-10-CM | POA: Diagnosis not present

## 2021-06-26 DIAGNOSIS — Z01812 Encounter for preprocedural laboratory examination: Secondary | ICD-10-CM | POA: Diagnosis not present

## 2021-06-26 DIAGNOSIS — M25552 Pain in left hip: Secondary | ICD-10-CM | POA: Diagnosis not present

## 2021-07-08 DIAGNOSIS — M1612 Unilateral primary osteoarthritis, left hip: Secondary | ICD-10-CM | POA: Diagnosis not present

## 2021-07-10 DIAGNOSIS — M170 Bilateral primary osteoarthritis of knee: Secondary | ICD-10-CM | POA: Diagnosis not present

## 2021-07-10 DIAGNOSIS — M1612 Unilateral primary osteoarthritis, left hip: Secondary | ICD-10-CM | POA: Diagnosis not present

## 2021-07-16 DIAGNOSIS — M170 Bilateral primary osteoarthritis of knee: Secondary | ICD-10-CM | POA: Diagnosis not present

## 2021-07-23 DIAGNOSIS — M170 Bilateral primary osteoarthritis of knee: Secondary | ICD-10-CM | POA: Diagnosis not present

## 2021-09-23 DIAGNOSIS — H524 Presbyopia: Secondary | ICD-10-CM | POA: Diagnosis not present

## 2021-09-23 IMAGING — CR DG LUMBAR SPINE COMPLETE 4+V
5 series · 5 of 5 positions shown · non-contrast
Comparison: 06/20/2018

CLINICAL DATA: Low back pain

EXAM:
LUMBAR SPINE - COMPLETE 4+ VIEW

[t l-spine a.p.]
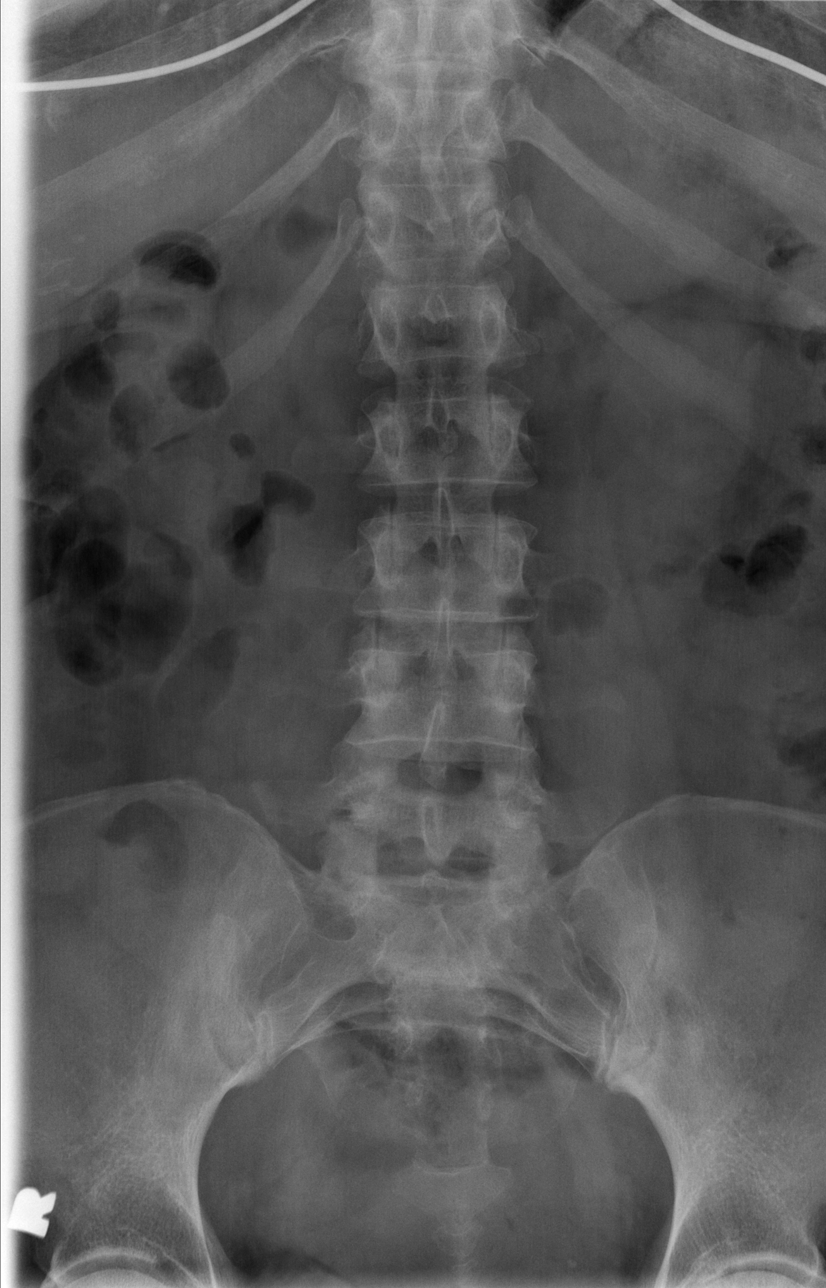

[t l-spine oblique exposure (1 of 2)]
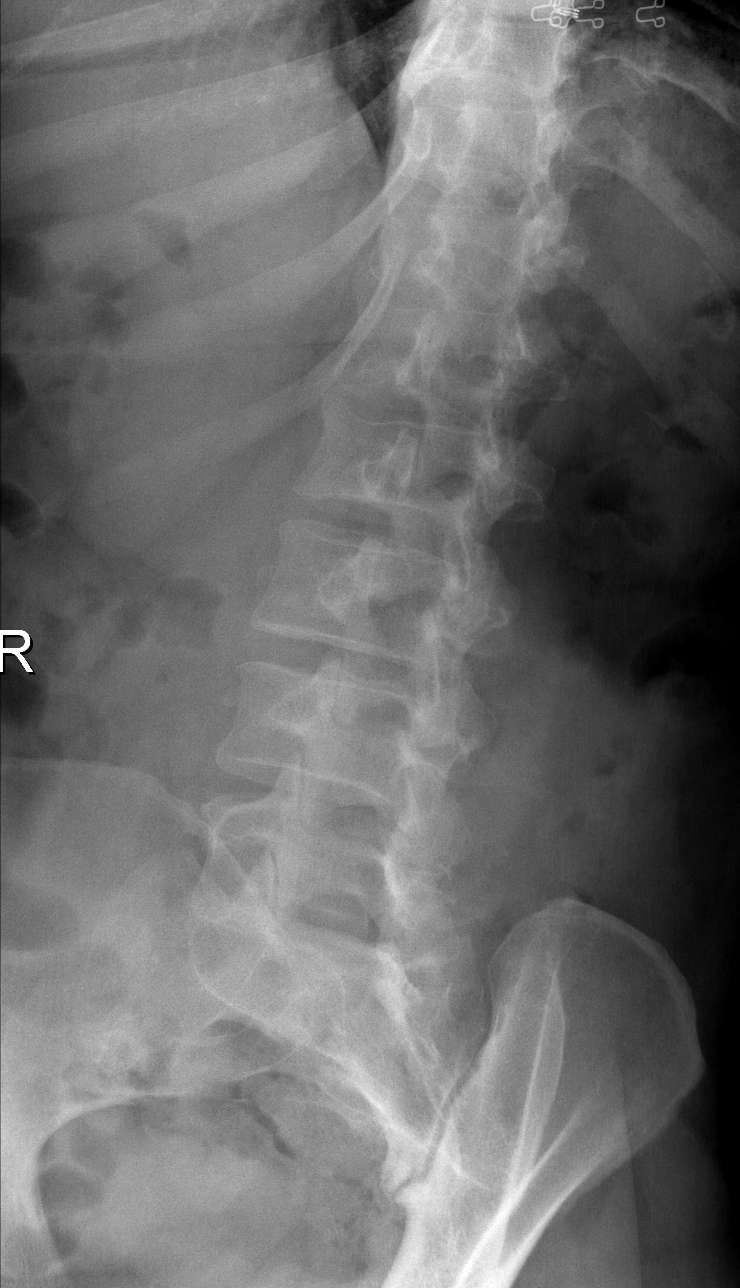

[t l-spine oblique exposure (2 of 2)]
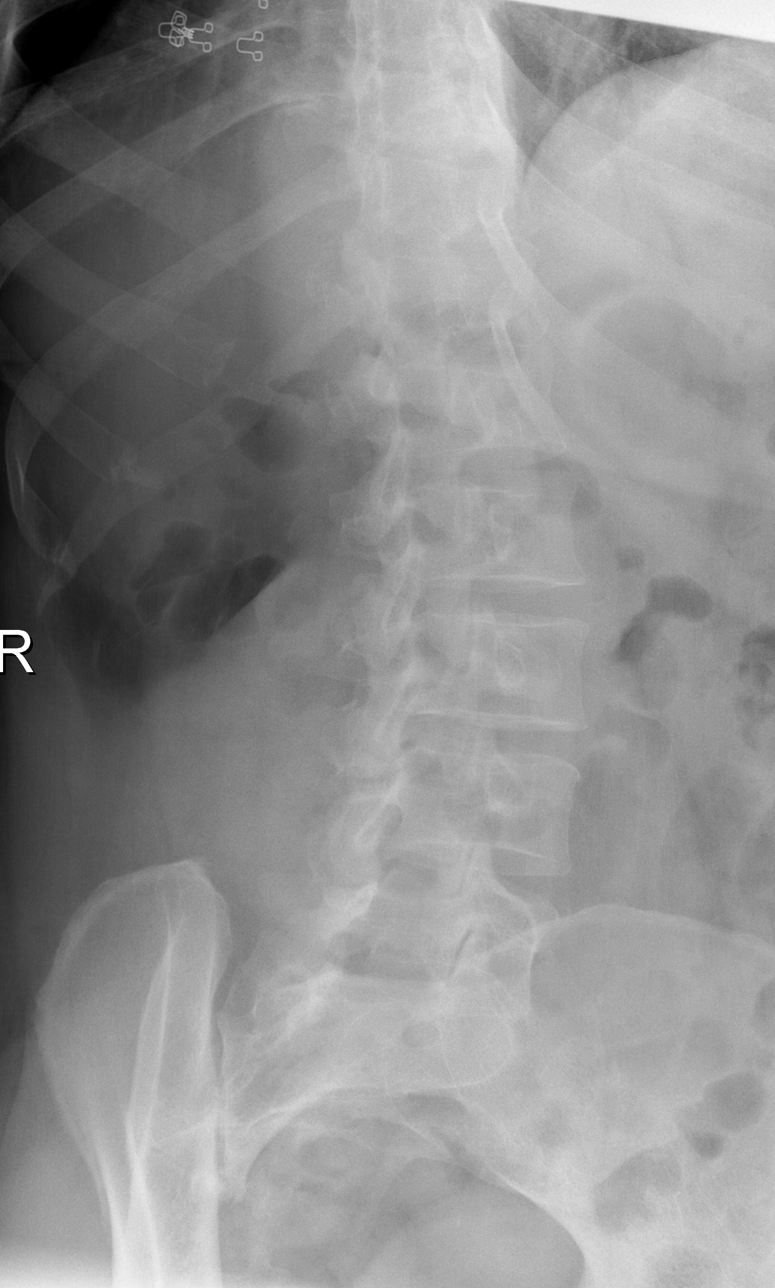

[t l-spine lat]
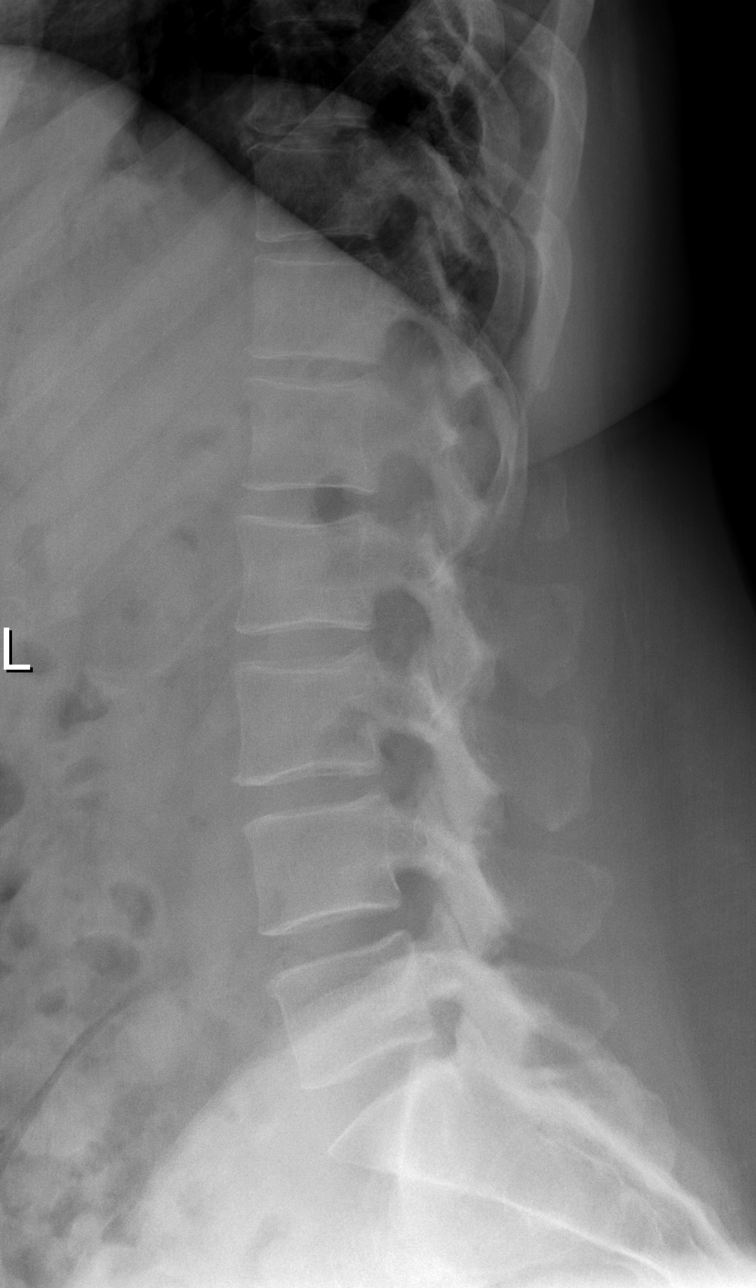

[t l-spine l5-s1 spot]
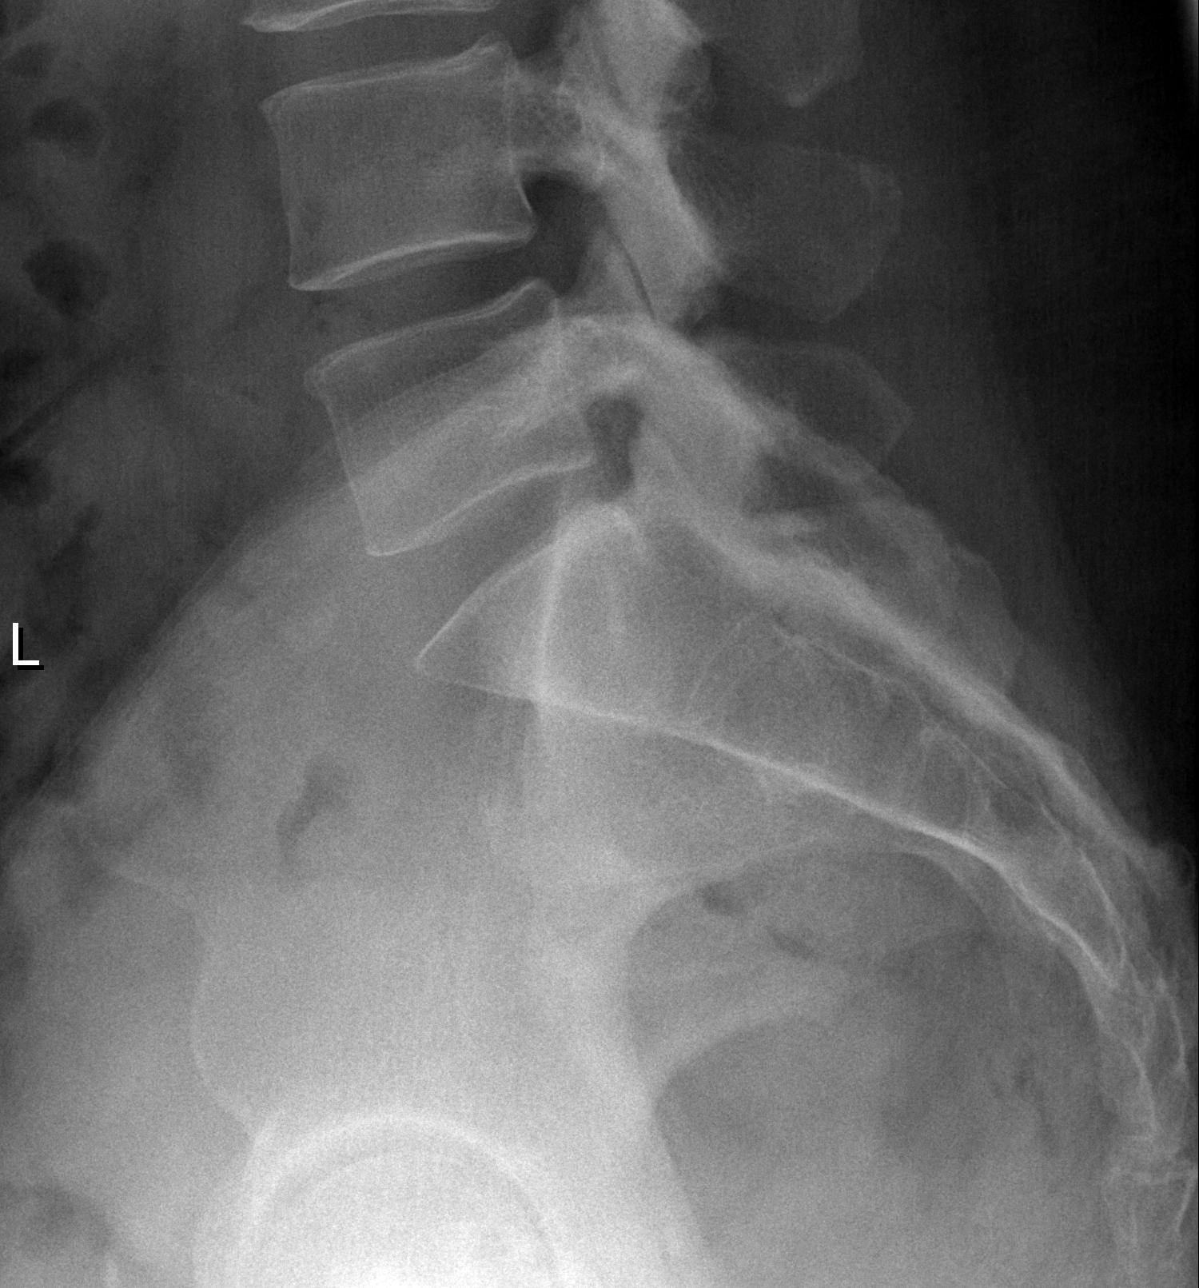

[5 of 5 positions shown; findings below may reference images not displayed]

FINDINGS: There is no evidence of lumbar spine fracture. Alignment is normal.
Intervertebral disc spaces are maintained.
IMPRESSION: Negative.

## 2021-10-06 DIAGNOSIS — M25562 Pain in left knee: Secondary | ICD-10-CM | POA: Diagnosis not present

## 2021-10-14 ENCOUNTER — Telehealth: Payer: Self-pay | Admitting: Family

## 2021-10-14 MED ORDER — LISDEXAMFETAMINE DIMESYLATE 30 MG PO CAPS: 30.0000 mg | ORAL_CAPSULE | Freq: Every day | ORAL | 0 refills | Status: DC

## 2021-10-14 NOTE — Telephone Encounter (Signed)
She filled controlled substance contract last visit but it was not scanned into her record.

## 2021-10-14 NOTE — Telephone Encounter (Signed)
Pt stated she doesn't know which MG she needs. She said previously you guys were playing around with her dosage and she was going back and forth between the 20MG  and the 30MG   Medication: lisdexamfetamine (VYVANSE) 30 MG capsule   Has the patient contacted their pharmacy? No. (If no, request that the patient contact the pharmacy for the refill.) (If yes, when and what did the pharmacy advise?)  Preferred Pharmacy (with phone number or street name): [facility] #[id number] - [place], [place] - [address]  [address] [place], [place] [address]  Phone:  [phone]  Fax:  [phone]   Agent: Please be advised that RX refills may take up to 3 business days. We ask that you follow-up with your pharmacy.

## 2021-11-25 ENCOUNTER — Telehealth: Payer: Self-pay | Admitting: Family

## 2021-11-25 NOTE — Telephone Encounter (Signed)
Medication: lisdexamfetamine (VYVANSE) 30 MG capsule   Has the patient contacted their pharmacy? Yes.     Preferred Pharmacy: [facility] #[id number] - [place], [place] - [address] AT [place]  [address] [place], [place] [address]  Phone:  [phone]  Fax:  [phone]

## 2021-11-25 NOTE — Telephone Encounter (Signed)
Pt was wondering if Tiffany Sharp would take her daughter on as Tiffany Sharp, Chain Dob: 11/10/2007.

## 2021-11-26 ENCOUNTER — Telehealth: Payer: Self-pay

## 2021-11-26 ENCOUNTER — Ambulatory Visit (INDEPENDENT_AMBULATORY_CARE_PROVIDER_SITE_OTHER): Payer: BC Managed Care – PPO | Admitting: Family

## 2021-11-26 VITALS — BP 111/72 | HR 70 | Temp 98.3°F | Resp 16 | Ht 71.0 in | Wt 232.0 lb

## 2021-11-26 DIAGNOSIS — Z23 Encounter for immunization: Secondary | ICD-10-CM | POA: Diagnosis not present

## 2021-11-26 DIAGNOSIS — R5383 Other fatigue: Secondary | ICD-10-CM | POA: Diagnosis not present

## 2021-11-26 DIAGNOSIS — F909 Attention-deficit hyperactivity disorder, unspecified type: Secondary | ICD-10-CM

## 2021-11-26 LAB — CBC WITH DIFFERENTIAL/PLATELET
Basophils Absolute: 0 10*3/uL (ref 0.0–0.1)
Basophils Relative: 0.5 % (ref 0.0–3.0)
Eosinophils Absolute: 0.1 10*3/uL (ref 0.0–0.7)
Eosinophils Relative: 1.9 % (ref 0.0–5.0)
HCT: 41.1 % (ref 36.0–46.0)
Hemoglobin: 12.8 g/dL (ref 12.0–15.0)
Lymphocytes Relative: 34 % (ref 12.0–46.0)
Lymphs Abs: 1.5 10*3/uL (ref 0.7–4.0)
MCHC: 31.1 g/dL (ref 30.0–36.0)
MCV: 71.8 fl — ABNORMAL LOW (ref 78.0–100.0)
Monocytes Absolute: 0.3 10*3/uL (ref 0.1–1.0)
Monocytes Relative: 6.9 % (ref 3.0–12.0)
Neutro Abs: 2.5 10*3/uL (ref 1.4–7.7)
Neutrophils Relative %: 56.7 % (ref 43.0–77.0)
Platelets: 142 10*3/uL — ABNORMAL LOW (ref 150.0–400.0)
RBC: 5.73 Mil/uL — ABNORMAL HIGH (ref 3.87–5.11)
RDW: 16 % — ABNORMAL HIGH (ref 11.5–15.5)
WBC: 4.4 10*3/uL (ref 4.0–10.5)

## 2021-11-26 LAB — TSH: TSH: 1.28 u[IU]/mL (ref 0.35–5.50)

## 2021-11-26 LAB — IRON: Iron: 92 ug/dL (ref 42–145)

## 2021-11-26 MED ORDER — VYVANSE 20 MG PO CHEW: CHEWABLE_TABLET | ORAL | 0 refills | Status: DC

## 2021-11-26 NOTE — Telephone Encounter (Signed)
Patient to come in for ov today

## 2021-11-26 NOTE — Telephone Encounter (Signed)
PA initiated via Covermymeds; KEY: [id number]. Awaiting determination.

## 2021-11-26 NOTE — Telephone Encounter (Signed)
Yes

## 2021-11-26 NOTE — Assessment & Plan Note (Signed)
Stable. She feels that sometimes the 30mg  of vyvanse is too much depending on the day so she would like the flexibility of 20 or 30mg  once daily. New rx sent. UDS today, controlled substance contract is updated.

## 2021-11-26 NOTE — Progress Notes (Signed)
Subjective:   By signing my name below, I, Zite Okoli, attest that this documentation has been prepared under the direction and in the presence of Debbrah Alar, NP 11/26/2021   Patient ID: Tiffany Sharp, female    DOB: 1971/03/17, 51 y.o.   MRN: [id number]  Chief Complaint  Patient presents with   ADHD    Needs refill on vyvanse    HPI Patient is in today for an office visit and follow up  ADHD- She reports that when she is not taking Vyvanse, she feels very exhausted. She feels like she has to take medication to be energized and is worried about her iron and vitamin D levels. She adds that it helps her to stay focused but her quality of sleep is dependent on the medications she takes it with.   Immunizations- She has received the flu vaccine and is UTD on tetanus vaccine. She will receive the second dose of the shingles vaccine today and is UTD on Covid-19 vaccines.  Past Medical History:  Diagnosis Date   Abscess    Breast asymmetry in female 10/12/2012   Right breast on December 2013 mammogram and ultrasound Followup ultrasound in June 2014   Breast lump    had biopsy, benign   Breast lump 08/25/2013   R breast, seen at [facility] 09/2012, likely glandular tissue.  Recommended f/u Mg in 6 months (this was prior to pregnancy)   Depression    PPD - post partum depression   Eclampsia    Endometritis    HELLP (hemolytic anemia/elev liver enzymes/low platelets in pregnancy)    History of echocardiogram    Echo 2/17: EF 60-65%, normal wall motion, mild RVE, atrial septal aneurysm (findings consistent with increased left atrial pressure)   History of toxic shock syndrome 08/25/2013   HSV-2 (herpes simplex virus 2) infection    IBS (irritable bowel syndrome)    Knee instability    Murmur    told this as a child - no specifict testing ever done   Post partum depression    Postpartum hemorrhage    Pregnancy induced hypertension    no meds outside of pregnancy    Preterm delivery 09/02/2013   Rubella non-immune status, antepartum 08/25/2013   Toxic shock syndrome ([place]) 1997   started with an abscess right axilla    Past Surgical History:  Procedure Laterality Date   BREAST BIOPSY  2013   benign   DILATION AND CURETTAGE OF UTERUS     DILATION AND EVACUATION N/A 09/01/2013   Procedure: DILATATION AND EVACUATION;  Surgeon: Delice Lesch, MD;  Location: [facility];  Service: Gynecology;  Laterality: N/A;   INCISE AND DRAIN ABCESS  1996   under left arm    Family History  Problem Relation Age of Onset   Cancer Mother        rare form of melanoma (hands)   Hypertension Father    Heart disease Father        chf, no CAD   Transient ischemic attack Father    Stomach cancer Maternal Grandfather    Heart attack Neg Hx    Colon polyps Neg Hx    Colon cancer Neg Hx    Esophageal cancer Neg Hx    Rectal cancer Neg Hx     Social History   Socioeconomic History   Marital status: Married    Spouse name: Not on file   Number of children: Not on file   Years of  education: Not on file   Highest education level: Not on file  Occupational History   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  Tobacco Use   Smoking status: Never   Smokeless tobacco: Never  Vaping Use   Vaping Use: Never used  Substance and Sexual Activity   Alcohol use: No   Drug use: No   Sexual activity: Not Currently    Birth control/protection: None  Other Topics Concern   Not on file  Social History Narrative   Married   5 daughters:   2000   2002   2004   2008   2014   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 Mix in drink     DIGESTIVE ENZYMES PO Take 1 tablet by mouth daily.     docusate sodium (COLACE) 100 MG capsule Take 100 mg by mouth daily.     gabapentin (NEURONTIN) 300 MG capsule Take 1 capsule (300 mg total) by mouth at bedtime. 90 capsule 1   Multiple Vitamins-Minerals (MULTIVITAMIN WITH MINERALS) tablet Take 1 tablet by mouth daily.     lisdexamfetamine (VYVANSE) 30 MG capsule Take 1 capsule (30 mg total) by mouth daily. 30 capsule 0   famotidine (PEPCID) 20 MG tablet Take 1 tablet (20 mg total) by mouth 2 (two) times daily. 60 tablet 5   meloxicam (MOBIC) 15 MG tablet Take 15 mg by mouth as needed.     No facility-administered medications prior to visit.    Allergies  Allergen Reactions   Penicillins Hives   Sulfa Antibiotics Hives    Review of Systems  Constitutional:  Negative for fever.  HENT:  Negative for ear pain and hearing loss.        (-)nystagmus (-)adenopathy  Eyes:  Negative for blurred vision.  Respiratory:  Negative for cough, shortness of breath and wheezing.   Cardiovascular:  Negative for chest pain and leg swelling.  Gastrointestinal:  Negative for blood in stool, diarrhea, nausea and vomiting.  Genitourinary:  Negative for dysuria and frequency.  Musculoskeletal:  Negative for joint pain and myalgias.  Skin:  Negative for rash.  Neurological:  Negative for headaches.  Psychiatric/Behavioral:  Negative for depression. The patient is not nervous/anxious.       Objective:    Physical Exam Constitutional:      General: She is not in acute distress.    Appearance: Normal appearance. She is not ill-appearing.  HENT:     Head: Normocephalic and atraumatic.     Right Ear: External ear normal.     Left Ear: External ear normal.  Eyes:     Extraocular Movements: Extraocular movements intact.     Pupils: Pupils are equal, round, and reactive to light.   Cardiovascular:     Rate and Rhythm: Normal rate and regular rhythm.     Pulses: Normal pulses.     Heart sounds: Normal heart sounds. No murmur heard. Pulmonary:     Effort: Pulmonary effort is normal. No respiratory distress.     Breath  sounds: Normal breath sounds. No wheezing or rhonchi.  Abdominal:     General: Bowel sounds are normal. There is no distension.     Palpations: Abdomen is soft.     Tenderness: There is no abdominal tenderness. There is no guarding or rebound.  Musculoskeletal:     Cervical back: Neck supple.  Lymphadenopathy:     Cervical: No cervical adenopathy.  Skin:    General: Skin is warm and dry.  Neurological:     Mental Status: She is alert and oriented to person, place, and time.  Psychiatric:        Behavior: Behavior normal.        Judgment: Judgment normal.    BP 111/72 (BP Location: Right Arm, Patient Position: Sitting, Cuff Size: Large)    Pulse 70    Temp 98.3 F (36.8 C) (Oral)    Resp 16    Ht 5\' 11"  (1.803 m)    Wt 232 lb (105.2 kg)    SpO2 98%    BMI 32.36 kg/m  Wt Readings from Last 3 Encounters:  11/26/21 232 lb (105.2 kg)  06/16/21 231 lb (104.8 kg)  05/16/21 231 lb 6.4 oz (105 kg)    Diabetic Foot Exam - Simple   No data filed    Lab Results  Component Value Date   WBC 5.6 06/16/2021   HGB 12.9 06/16/2021   HCT 41.0 06/16/2021   PLT 145.0 (L) 06/16/2021   GLUCOSE 100 (H) 06/16/2021   CHOL 121 10/08/2017   TRIG 48.0 10/08/2017   HDL 46.20 10/08/2017   LDLCALC 65 10/08/2017   ALT 19 06/16/2021   AST 15 06/16/2021   NA 141 06/16/2021   K 3.9 06/16/2021   CL 105 06/16/2021   CREATININE 0.62 06/16/2021   BUN 16 06/16/2021   CO2 29 06/16/2021   TSH 1.67 10/08/2017   INR 1.1 (H) 06/16/2021   HGBA1C 5.8 06/05/2015    Lab Results  Component Value Date   TSH 1.67 10/08/2017   Lab Results  Component Value Date   WBC 5.6 06/16/2021   HGB 12.9 06/16/2021   HCT 41.0 06/16/2021   MCV 72.5 (L) 06/16/2021   PLT 145.0  (L) 06/16/2021   Lab Results  Component Value Date   NA 141 06/16/2021   K 3.9 06/16/2021   CO2 29 06/16/2021   GLUCOSE 100 (H) 06/16/2021   BUN 16 06/16/2021   CREATININE 0.62 06/16/2021   BILITOT 0.8 06/16/2021   ALKPHOS 71 06/16/2021   AST 15 06/16/2021   ALT 19 06/16/2021   PROT 6.2 06/16/2021   ALBUMIN 4.1 06/16/2021   CALCIUM 9.3 06/16/2021   ANIONGAP 6 09/25/2019   GFR 103.52 06/16/2021   Lab Results  Component Value Date   CHOL 121 10/08/2017   Lab Results  Component Value Date   HDL 46.20 10/08/2017   Lab Results  Component Value Date   LDLCALC 65 10/08/2017   Lab Results  Component Value Date   TRIG 48.0 10/08/2017   Lab Results  Component Value Date   CHOLHDL 3 10/08/2017   Lab Results  Component Value Date   HGBA1C 5.8 06/05/2015       Assessment & Plan:   Problem List Items Addressed This Visit       Unprioritized   ADHD (attention deficit hyperactivity disorder)    Stable. She feels that sometimes the 30mg  of vyvanse is too much depending on the day so she would like the flexibility of  20 or 30mg  once daily. New rx sent. UDS today, controlled substance contract is updated.       Relevant Medications   Lisdexamfetamine Dimesylate (VYVANSE) 20 MG CHEW   Other Relevant Orders   DRUG MONITORING, PANEL 8 WITH CONFIRMATION, URINE   Other Visit Diagnoses     Fatigue, unspecified type    -  Primary   Relevant Orders   CBC with Differential/Platelet   Iron   TSH   Need for shingles vaccine       Relevant Orders   Varicella-zoster vaccine IM (Shingrix) (Completed)       Meds ordered this encounter  Medications   Lisdexamfetamine Dimesylate (VYVANSE) 20 MG CHEW    Sig: Take 1 to 1.5 tabs once daily as needed    Dispense:  45 tablet    Refill:  0    Order Specific Question:   Supervising Provider    Answer:   Penni Homans A [[id number]]    I,Zite Okoli,acting as a scribe for Nance Pear, NP.,have documented all relevant  documentation on the behalf of Nance Pear, NP,as directed by  Nance Pear, NP while in the presence of Nance Pear, NP.   I, Debbrah Alar, NP , personally preformed the services described in this documentation.  All medical record entries made by the scribe were at my direction and in my presence.  I have reviewed the chart and discharge instructions (if applicable) and agree that the record reflects my personal performance and is accurate and complete. 11/26/2021

## 2021-11-27 LAB — DRUG MONITORING, PANEL 8 WITH CONFIRMATION, URINE
6 Acetylmorphine: NEGATIVE ng/mL (ref <10)
Alcohol Metabolites: NEGATIVE ng/mL (ref <500)
Amphetamines: NEGATIVE ng/mL (ref <500)
Benzodiazepines: NEGATIVE ng/mL (ref <100)
Buprenorphine, Urine: NEGATIVE ng/mL (ref <5)
Cocaine Metabolite: NEGATIVE ng/mL (ref <150)
Creatinine: 96.1 mg/dL (ref >=20.0)
MDMA: NEGATIVE ng/mL (ref <500)
Marijuana Metabolite: NEGATIVE ng/mL (ref <20)
Opiates: NEGATIVE ng/mL (ref <100)
Oxidant: NEGATIVE ug/mL (ref <200)
Oxycodone: NEGATIVE ng/mL (ref <100)
pH: 5.5 (ref 4.5–9.0)

## 2021-11-27 LAB — DM TEMPLATE

## 2021-11-28 NOTE — Telephone Encounter (Signed)
PA denied. Awaiting denial information.  °

## 2021-12-02 DIAGNOSIS — F419 Anxiety disorder, unspecified: Secondary | ICD-10-CM | POA: Diagnosis not present

## 2021-12-02 DIAGNOSIS — F909 Attention-deficit hyperactivity disorder, unspecified type: Secondary | ICD-10-CM | POA: Diagnosis not present

## 2021-12-02 MED ORDER — VYVANSE 20 MG PO CHEW: 1.0000 | CHEWABLE_TABLET | Freq: Every day | ORAL | 0 refills | Status: DC

## 2021-12-02 NOTE — Telephone Encounter (Signed)
Rx resent for 1 tablet daily. Pt aware.

## 2021-12-02 NOTE — Telephone Encounter (Signed)
Medication denied as plan only cover 1 tab daily, not 1.5.

## 2021-12-25 DIAGNOSIS — J0140 Acute pansinusitis, unspecified: Secondary | ICD-10-CM | POA: Diagnosis not present

## 2021-12-30 DIAGNOSIS — F419 Anxiety disorder, unspecified: Secondary | ICD-10-CM | POA: Diagnosis not present

## 2021-12-30 DIAGNOSIS — Z9189 Other specified personal risk factors, not elsewhere classified: Secondary | ICD-10-CM | POA: Diagnosis not present

## 2021-12-30 DIAGNOSIS — F909 Attention-deficit hyperactivity disorder, unspecified type: Secondary | ICD-10-CM | POA: Diagnosis not present

## 2021-12-30 DIAGNOSIS — E663 Overweight: Secondary | ICD-10-CM | POA: Diagnosis not present

## 2021-12-31 ENCOUNTER — Ambulatory Visit (INDEPENDENT_AMBULATORY_CARE_PROVIDER_SITE_OTHER): Payer: BC Managed Care – PPO | Admitting: Family

## 2021-12-31 DIAGNOSIS — F909 Attention-deficit hyperactivity disorder, unspecified type: Secondary | ICD-10-CM

## 2021-12-31 DIAGNOSIS — J302 Other seasonal allergic rhinitis: Secondary | ICD-10-CM | POA: Insufficient documentation

## 2021-12-31 MED ORDER — AMPHETAMINE-DEXTROAMPHET ER 10 MG PO CP24: ORAL_CAPSULE | ORAL | 0 refills | Status: DC

## 2021-12-31 NOTE — Assessment & Plan Note (Signed)
Uncontrolled. Recommended that she try changing from zyrtec to allegra and consider adding in flonase.  ?

## 2021-12-31 NOTE — Progress Notes (Signed)
? ?Subjective:  ? ?By signing my name below, I, Shehryar Baig, attest that this documentation has been prepared under the direction and in the presence of Debbrah Alar, NP. 12/31/2021 ? ? ? Patient ID: Tiffany Sharp, female    DOB: [birth year], 51 y.o.   MRN: [id number] ? ?Chief Complaint  ?Patient presents with  ? ADHD  ?  Will like to discuss the use of Vyvanse  ? ? ?HPI ?Patient is in today for a office visit.  ?ADHD- She is requesting to take an alternative medication to her 20 mg vyvanse. She has been on and off vyvanse for the past 5 years. She is currently taking 20 mg vyvanse daily PO. While taking vyvanse she describes it as "her head feeling heavy". She has tried another medication other than vyvanse but forgot the name. She reports doing well while on it. She is trying to find a job at this time and thinks finding another medication will help her symptoms while working. She does not have a blood pressure cuff at home but is willing to check her blood pressure somewhere else while taking her new medication.  ?Allergies- She reports her allergies are worsening. She is starting to feel pressure in her head and scratchy throat. She is taking zyrtec to manage her symptoms and finds mild relief. ? ? ?Health Maintenance Due  ?Topic Date Due  ? Hepatitis C Screening  Never done  ? PAP SMEAR-Modifier  02/23/2020  ? COVID-19 Vaccine (3 - Booster for Pfizer series) 03/20/2020  ? ? ?Past Medical History:  ?Diagnosis Date  ? Abscess   ? Breast asymmetry in female 10/12/2012  ? Right breast on December 2013 mammogram and ultrasound Followup ultrasound in June 2014  ? Breast lump   ? had biopsy, benign  ? Breast lump 08/25/2013  ? R breast, [facility] 09/2012, likely glandular tissue.  Recommended f/u Mg in 6 months (this was prior to pregnancy)  ? Depression   ? PPD - post partum depression  ? Eclampsia   ? Endometritis   ? HELLP (hemolytic anemia/elev liver enzymes/low platelets in pregnancy)   ?  History of echocardiogram   ? Echo 2/17: EF 60-65%, normal wall motion, mild RVE, atrial septal aneurysm (findings consistent with increased left atrial pressure)  ? History of toxic shock syndrome 08/25/2013  ? HSV-2 (herpes simplex virus 2) infection   ? IBS (irritable bowel syndrome)   ? Knee instability   ? Murmur   ? told this as a child - no specifict testing ever done  ? Post partum depression   ? Postpartum hemorrhage   ? Pregnancy induced hypertension   ? no meds outside of pregnancy  ? Preterm delivery 09/02/2013  ? Rubella non-immune status, antepartum 08/25/2013  ? Toxic shock syndrome ([place]) 1997  ? started with an abscess right axilla  ? ? ?Past Surgical History:  ?Procedure Laterality Date  ? BREAST BIOPSY  2013  ? benign  ? DILATION AND CURETTAGE OF UTERUS    ? DILATION AND EVACUATION N/A 09/01/2013  ? Procedure: DILATATION AND EVACUATION;  Surgeon: Delice Lesch, MD;  Location: [facility];  Service: Gynecology;  Laterality: N/A;  ? INCISE AND DRAIN ABCESS  1996  ? under left arm  ? ? ?Family History  ?Problem Relation Age of Onset  ? Cancer Mother   ?     rare form of melanoma (hands)  ? Hypertension Father   ? Heart disease Father   ?  chf, no CAD  ? Transient ischemic attack Father   ? Stomach cancer Maternal Grandfather   ? Heart attack Neg Hx   ? Colon polyps Neg Hx   ? Colon cancer Neg Hx   ? Esophageal cancer Neg Hx   ? Rectal cancer Neg Hx   ? ? ?Social History  ? ?Socioeconomic History  ? Marital status: Married  ?  Spouse name: Not on file  ? Number of children: Not on file  ? Years of education: Not on file  ? Highest education level: Not on file  ?Occupational History  ? 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 Smoking status: Never  ? Smokeless tobacco: Never  ?Vaping Use  ? Vaping Use: Never used  ?Substance and Sexual Activity  ? Alcohol use: No  ? Drug use: No  ? Sexual activity: Not Currently  ?  Birth control/protection: None  ?Other Topics Concern  ? Not on file  ?Social History  Narrative  ? Married  ? 5 daughters:  ? 31  ? 2002  ? 2004  ? 2008  ? 2014  ? 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 College degree  ? Pastors wife  ? ?Social Determinants of Health  ? ?Financial Resource Strain: Not on file  ?Food Insecurity: Not on file  ?Transportation Needs: Not on file  ?Physical Activity: Not on file  ?Stress: Not on file  ?Social Connections: Not on file  ?Intimate Partner Violence: Not on file  ? ? ?Outpatient Medications Prior to Visit  ?Medication Sig Dispense Refill  ? Cyanocobalamin (B-12 PO) Take 1 each by mouth daily. Mix in drink    ? DIGESTIVE ENZYMES PO Take 1 tablet by mouth daily.    ? docusate sodium (COLACE) 100 MG capsule Take 100 mg by mouth daily.    ? gabapentin (NEURONTIN) 300 MG capsule Take 1 capsule (300 mg total) by mouth at bedtime. 90 capsule 1  ? Multiple Vitamins-Minerals (MULTIVITAMIN WITH MINERALS) tablet Take 1 tablet by mouth daily.    ? Lisdexamfetamine Dimesylate (VYVANSE) 20 MG CHEW Chew 1 tablet by mouth daily. 30 tablet 0  ? ?No facility-administered medications prior to visit.  ? ? ?Allergies  ?Allergen Reactions  ? Penicillins Hives  ? Sulfa Antibiotics Hives  ? ? ?Review of Systems  ?Endo/Heme/Allergies:  Positive for environmental allergies.  ? ?   ?Objective:  ?  ?Physical Exam ?Constitutional:   ?   General: She is not in acute distress. ?   Appearance: Normal appearance. She is not ill-appearing.  ?HENT:  ?   Head: Normocephalic and atraumatic.  ?   Right Ear: External ear normal.  ?   Left Ear: External ear normal.  ?Eyes:  ?   Extraocular Movements: Extraocular movements intact.  ?   Pupils: Pupils are equal, round, and reactive to light.  ?Cardiovascular:  ?   Rate and Rhythm: Normal rate and regular rhythm.  ?   Heart sounds: Normal heart sounds. No murmur heard. ?  No gallop.  ?Pulmonary:  ?   Effort: Pulmonary effort is normal. No respiratory distress.  ?   Breath sounds: Normal breath sounds. No  wheezing or rales.  ?Skin: ?   General: Skin is warm and dry.  ?Neurological:  ?   Mental Status: She is alert and oriented to person, place, and time.  ?Psychiatric:     ?   Behavior: Behavior normal.  ? ? ?BP 115/76 (BP Location:  Right Arm, Patient Position: Sitting, Cuff Size: Large)   Pulse 74   Temp 98.4 ?F (36.9 ?C) (Oral)   Resp 16   Wt 233 lb (105.7 kg)   SpO2 99%   BMI 32.50 kg/m?  ?Wt Readings from Last 3 Encounters:  ?12/31/21 233 lb (105.7 kg)  ?11/26/21 232 lb (105.2 kg)  ?06/16/21 231 lb (104.8 kg)  ? ? ?   ?Assessment & Plan:  ? ?Problem List Items Addressed This Visit   ? ?  ? Unprioritized  ? Seasonal allergies  ?  Uncontrolled. Recommended that she try changing from zyrtec to allegra and consider adding in flonase.  ?  ?  ? ADHD (attention deficit hyperactivity disorder)  ?  Not currently tolerating vyvanse that well. Does not like the way it makes her feel. Will d/c vyvanse and begin adderall xr. 10mg  once daily for 1 week, then increase to 2 tabs once daily.  ?  ?  ? ? ? ?Meds ordered this encounter  ?Medications  ? amphetamine-dextroamphetamine (ADDERALL XR) 10 MG 24 hr capsule  ?  Sig: Take 1 tablet by mouth in the AM for 1 week.  Then increase to 2 tabs once daily  ?  Dispense:  60 capsule  ?  Refill:  0  ?  Order Specific Question:   Supervising Provider  ?  Answer:   Penni Homans A [[id number]]  ? ? ?I, Debbrah Alar, NP, personally preformed the services described in this documentation.  All medical record entries made by the scribe were at my direction and in my presence.  I have reviewed the chart and discharge instructions (if applicable) and agree that the record reflects my personal performance and is accurate and complete. 12/31/2021 ? ? ?Engineering geologist as a Education administrator for [employer], NP.,have documented all relevant documentation on the behalf of Nance Pear, NP,as directed by  Nance Pear, NP while in the presence of Nance Pear,  NP. ? ? ?Nance Pear, NP ? ?

## 2021-12-31 NOTE — Assessment & Plan Note (Signed)
Not currently tolerating vyvanse that well. Does not like the way it makes her feel. Will d/c vyvanse and begin adderall xr. 10mg  once daily for 1 week, then increase to 2 tabs once daily.  ?

## 2021-12-31 NOTE — Patient Instructions (Signed)
Begin Adderall xr 10mg  once daily in the AM for 1 week, then increase to 2 tabs once daily on week two. ?

## 2022-01-21 ENCOUNTER — Ambulatory Visit: Payer: BC Managed Care – PPO | Admitting: Family

## 2022-01-26 ENCOUNTER — Ambulatory Visit (INDEPENDENT_AMBULATORY_CARE_PROVIDER_SITE_OTHER): Payer: BC Managed Care – PPO | Admitting: Family

## 2022-01-26 DIAGNOSIS — J302 Other seasonal allergic rhinitis: Secondary | ICD-10-CM | POA: Diagnosis not present

## 2022-01-26 DIAGNOSIS — F909 Attention-deficit hyperactivity disorder, unspecified type: Secondary | ICD-10-CM

## 2022-01-26 MED ORDER — AMPHETAMINE-DEXTROAMPHET ER 20 MG PO CP24: 20.0000 mg | ORAL_CAPSULE | Freq: Every day | ORAL | 0 refills | Status: DC

## 2022-01-26 NOTE — Assessment & Plan Note (Addendum)
Stable. Will continue Adderall xr '20mg'$  once daily. (I have changed her from 2 tablets of '10mg'$  to '20mg'$  tablets). ?

## 2022-01-26 NOTE — Progress Notes (Signed)
? ?Subjective:  ? ?By signing my name below, I, Shehryar Baig, attest that this documentation has been prepared under the direction and in the presence of Debbrah Alar, NP. 01/26/2022 ? ? ? Patient ID: Tiffany Sharp, female    DOB: [birth year], 51 y.o.   MRN: [id number] ? ?Chief Complaint  ?Patient presents with  ? ADHD  ?  Here for follow up  ? Seasonal allergies  ?  Here for follow up  ? ? ?HPI ?Patient is in today for a follow up visit.  ? ?ADHD- During last visit she was switched from Vyvance to 10 mg adderall XR 2x daily PO and reports no new issues while on it. She reports not being able to tell the effects while on adderall XR. She finds her symptoms feel better while on adderall than without it. She is willing to take one 20 mg tablet of adderall XR every day instead of two 10 mg tablets.  ?Allergies- She continues taking OTC zyrtec and allegra, and Flonase to manage her allergies and reports no new issues while taking it. Her allergies have worsened since the change in weather. She reports Flonase is the most effective in managing her symptoms.  ? ? ?Health Maintenance Due  ?Topic Date Due  ? Hepatitis C Screening  Never done  ? PAP SMEAR-Modifier  02/23/2020  ? COVID-19 Vaccine (3 - Booster for Pfizer series) 03/20/2020  ? ? ?Past Medical History:  ?Diagnosis Date  ? Abscess   ? Breast asymmetry in female 10/12/2012  ? Right breast on December 2013 mammogram and ultrasound Followup ultrasound in June 2014  ? Breast lump   ? had biopsy, benign  ? Breast lump 08/25/2013  ? R breast, [facility] at [facility] 09/2012, likely glandular tissue.  Recommended f/u Mg in 6 months (this was prior to pregnancy)  ? Depression   ? PPD - post partum depression  ? Eclampsia   ? Endometritis   ? HELLP (hemolytic anemia/elev liver enzymes/low platelets in pregnancy)   ? History of echocardiogram   ? Echo 2/17: EF 60-65%, normal wall motion, mild RVE, atrial septal aneurysm (findings consistent with increased left atrial  pressure)  ? History of toxic shock syndrome 08/25/2013  ? HSV-2 (herpes simplex virus 2) infection   ? IBS (irritable bowel syndrome)   ? Knee instability   ? Murmur   ? told this as a child - no specifict testing ever done  ? Post partum depression   ? Postpartum hemorrhage   ? Pregnancy induced hypertension   ? no meds outside of pregnancy  ? Preterm delivery 09/02/2013  ? Rubella non-immune status, antepartum 08/25/2013  ? Toxic shock syndrome ([place]) 1997  ? started with an abscess right axilla  ? ? ?Past Surgical History:  ?Procedure Laterality Date  ? BREAST BIOPSY  2013  ? benign  ? DILATION AND CURETTAGE OF UTERUS    ? DILATION AND EVACUATION N/A 09/01/2013  ? Procedure: DILATATION AND EVACUATION;  Surgeon: Delice Lesch, MD;  Location: [facility];  Service: Gynecology;  Laterality: N/A;  ? INCISE AND DRAIN ABCESS  1996  ? under left arm  ? ? ?Family History  ?Problem Relation Age of Onset  ? Cancer Mother   ?     rare form of melanoma (hands)  ? Hypertension Father   ? Heart disease Father   ?     chf, no CAD  ? Transient ischemic attack Father   ? Stomach cancer Maternal  Grandfather   ? Heart attack Neg Hx   ? Colon polyps Neg Hx   ? Colon cancer Neg Hx   ? Esophageal cancer Neg Hx   ? Rectal cancer Neg Hx   ? ? ?Social History  ? ?Socioeconomic History  ? Marital status: Married  ?  Spouse name: Not on file  ? Number of children: Not on file  ? Years of education: Not on file  ? Highest education level: Not on file  ?Occupational History  ? 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 Smoking status: Never  ? Smokeless tobacco: Never  ?Vaping Use  ? Vaping Use: Never used  ?Substance and Sexual Activity  ? Alcohol use: No  ? Drug use: No  ? Sexual activity: Not Currently  ?  Birth control/protection: None  ?Other Topics Concern  ? Not on file  ?Social History Narrative  ? Married  ? 5 daughters:  ? 65  ? 2002  ? 2004  ? 2008  ? 2014  ? 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 College degree  ? Pastors wife  ? ?Social Determinants of Health  ? ?Financial Resource Strain: Not on file  ?Food Insecurity: Not on file  ?Transportation Needs: Not on file  ?Physical Activity: Not on file  ?Stress: Not on file  ?Social Connections: Not on file  ?Intimate Partner Violence: Not on file  ? ? ?Outpatient Medications Prior to Visit  ?Medication Sig Dispense Refill  ? Cyanocobalamin (B-12 PO) Take 1 each by mouth daily. Mix in drink    ? DIGESTIVE ENZYMES PO Take 1 tablet by mouth daily.    ? docusate sodium (COLACE) 100 MG capsule Take 100 mg by mouth daily.    ? gabapentin (NEURONTIN) 300 MG capsule Take 1 capsule (300 mg total) by mouth at bedtime. 90 capsule 1  ? Multiple Vitamins-Minerals (MULTIVITAMIN WITH MINERALS) tablet Take 1 tablet by mouth daily.    ? amphetamine-dextroamphetamine (ADDERALL XR) 10 MG 24 hr capsule Take 1 tablet by mouth in the AM for 1 week.  Then increase to 2 tabs once daily 60 capsule 0  ? ?No facility-administered medications prior to visit.  ? ? ?Allergies  ?Allergen Reactions  ? Penicillins Hives  ? Sulfa Antibiotics Hives  ? ? ?Review of Systems  ?Endo/Heme/Allergies:  Positive for environmental allergies.  ? ?   ?Objective:  ?  ?Physical Exam ?Constitutional:   ?   General: She is not in acute distress. ?   Appearance: Normal appearance. She is not ill-appearing.  ?HENT:  ?   Head: Normocephalic and atraumatic.  ?   Right Ear: External ear normal.  ?   Left Ear: External ear normal.  ?Eyes:  ?   Extraocular Movements: Extraocular movements intact.  ?   Pupils: Pupils are equal, round, and reactive to light.  ?Cardiovascular:  ?   Rate and Rhythm: Normal rate and regular rhythm.  ?   Heart sounds: Normal heart sounds. No murmur heard. ?  No gallop.  ?Pulmonary:  ?   Effort: Pulmonary effort is normal. No respiratory distress.  ?   Breath sounds: Normal breath sounds. No wheezing or rales.  ?Skin: ?   General: Skin is warm and dry.  ?Neurological:  ?    Mental Status: She is alert and oriented to person, place, and time.  ?Psychiatric:     ?   Judgment: Judgment normal.  ? ? ?BP 131/87 (BP Location:  Right Arm, Patient Position: Sitting, Cuff Size: Large)   Pulse 69   Temp 98.3 ?F (36.8 ?C) (Oral)   Resp 16   Wt 234 lb (106.1 kg)   SpO2 98%   BMI 32.64 kg/m?  ?Wt Readings from Last 3 Encounters:  ?01/26/22 234 lb (106.1 kg)  ?12/31/21 233 lb (105.7 kg)  ?11/26/21 232 lb (105.2 kg)  ? ? ?   ?Assessment & Plan:  ? ?Problem List Items Addressed This Visit   ? ?  ? Unprioritized  ? Seasonal allergies  ?  Improved when she takes both the flonase and the Allegra. Encouraged her to try to stay consistent with these medications through the spring season.  ?  ?  ? ADHD (attention deficit hyperactivity disorder)  ?  Stable. Will continue Adderall xr '20mg'$  once daily. (I have changed her from 2 tablets of '10mg'$  to '20mg'$  tablets). ?  ?  ? ? ? ?Meds ordered this encounter  ?Medications  ? amphetamine-dextroamphetamine (ADDERALL XR) 20 MG 24 hr capsule  ?  Sig: Take 1 capsule (20 mg total) by mouth daily.  ?  Dispense:  30 capsule  ?  Refill:  0  ?  Order Specific Question:   Supervising Provider  ?  Answer:   Penni Homans A [[id number]]  ? ? ?I, Nance Pear, NP, personally preformed the services described in this documentation.  All medical record entries made by the scribe were at my direction and in my presence.  I have reviewed the chart and discharge instructions (if applicable) and agree that the record reflects my personal performance and is accurate and complete. 01/26/2022 ? ? ?Engineering geologist as a Education administrator for [employer], NP.,have documented all relevant documentation on the behalf of Nance Pear, NP,as directed by  Nance Pear, NP while in the presence of Nance Pear, NP. ? ? ?Nance Pear, NP ? ?

## 2022-01-26 NOTE — Assessment & Plan Note (Signed)
Improved when she takes both the flonase and the Allegra. Encouraged her to try to stay consistent with these medications through the spring season.  ?

## 2022-02-12 DIAGNOSIS — M79661 Pain in right lower leg: Secondary | ICD-10-CM | POA: Diagnosis not present

## 2022-02-17 ENCOUNTER — Encounter: Payer: Self-pay | Admitting: Family

## 2022-02-17 DIAGNOSIS — F909 Attention-deficit hyperactivity disorder, unspecified type: Secondary | ICD-10-CM | POA: Diagnosis not present

## 2022-02-17 DIAGNOSIS — Z9189 Other specified personal risk factors, not elsewhere classified: Secondary | ICD-10-CM | POA: Diagnosis not present

## 2022-02-17 DIAGNOSIS — F419 Anxiety disorder, unspecified: Secondary | ICD-10-CM | POA: Diagnosis not present

## 2022-02-25 ENCOUNTER — Encounter: Payer: Self-pay | Admitting: Family

## 2022-02-25 MED ORDER — AMPHETAMINE-DEXTROAMPHET ER 20 MG PO CP24: 20.0000 mg | ORAL_CAPSULE | Freq: Every day | ORAL | 0 refills | Status: DC

## 2022-02-27 ENCOUNTER — Ambulatory Visit: Payer: BC Managed Care – PPO | Admitting: Family

## 2022-03-03 ENCOUNTER — Other Ambulatory Visit (HOSPITAL_BASED_OUTPATIENT_CLINIC_OR_DEPARTMENT_OTHER): Payer: Self-pay

## 2022-03-03 DIAGNOSIS — R42 Dizziness and giddiness: Secondary | ICD-10-CM | POA: Diagnosis not present

## 2022-03-03 DIAGNOSIS — Z9189 Other specified personal risk factors, not elsewhere classified: Secondary | ICD-10-CM | POA: Diagnosis not present

## 2022-03-04 MED ORDER — LISDEXAMFETAMINE DIMESYLATE 20 MG PO CAPS: 20.0000 mg | ORAL_CAPSULE | Freq: Every day | ORAL | 0 refills | Status: DC

## 2022-03-04 NOTE — Addendum Note (Signed)
Addended by: Debbrah Alar on: 03/04/2022 10:49 AM ? ? Modules accepted: Orders ? ?

## 2022-03-26 ENCOUNTER — Telehealth: Payer: Self-pay | Admitting: Family

## 2022-03-26 NOTE — Telephone Encounter (Signed)
Medication:  lisdexamfetamine (VYVANSE) 30 MG capsule  Patient would like to change from '20mg'$  to '30mg'$   Has the patient contacted their pharmacy? Yes.    Preferred Pharmacy (with phone number or street name): [id number] - [place], [place] - [address]  [address] [place], [place] [address]  Phone:  [phone]  Fax:  [phone]   Agent: Please be advised that RX refills may take up to 3 business days. We ask that you follow-up with your pharmacy.

## 2022-03-31 ENCOUNTER — Telehealth: Payer: Self-pay | Admitting: Family

## 2022-03-31 ENCOUNTER — Other Ambulatory Visit: Payer: Self-pay

## 2022-03-31 NOTE — Telephone Encounter (Signed)
Patient would like vyanse 20 MG sent downstairs to the pharmacy.   Patients current pharmacy is out of stock

## 2022-04-01 ENCOUNTER — Other Ambulatory Visit (HOSPITAL_BASED_OUTPATIENT_CLINIC_OR_DEPARTMENT_OTHER): Payer: Self-pay

## 2022-04-01 MED ORDER — LISDEXAMFETAMINE DIMESYLATE 20 MG PO CAPS
20.0000 mg | ORAL_CAPSULE | Freq: Every day | ORAL | 0 refills | Status: DC
Start: 2022-04-01 — End: 2022-05-07
  Filled 2022-04-01: qty 30, 30d supply, fill #0

## 2022-04-02 ENCOUNTER — Other Ambulatory Visit (HOSPITAL_BASED_OUTPATIENT_CLINIC_OR_DEPARTMENT_OTHER): Payer: Self-pay

## 2022-04-13 DIAGNOSIS — F909 Attention-deficit hyperactivity disorder, unspecified type: Secondary | ICD-10-CM | POA: Diagnosis not present

## 2022-04-13 DIAGNOSIS — G8929 Other chronic pain: Secondary | ICD-10-CM | POA: Diagnosis not present

## 2022-04-13 DIAGNOSIS — R519 Headache, unspecified: Secondary | ICD-10-CM | POA: Diagnosis not present

## 2022-04-24 ENCOUNTER — Other Ambulatory Visit: Payer: Self-pay | Admitting: Family

## 2022-04-24 ENCOUNTER — Other Ambulatory Visit (HOSPITAL_BASED_OUTPATIENT_CLINIC_OR_DEPARTMENT_OTHER): Payer: Self-pay

## 2022-04-24 NOTE — Telephone Encounter (Signed)
Too early to refill.

## 2022-05-07 ENCOUNTER — Other Ambulatory Visit: Payer: Self-pay | Admitting: Family

## 2022-05-07 ENCOUNTER — Other Ambulatory Visit (HOSPITAL_BASED_OUTPATIENT_CLINIC_OR_DEPARTMENT_OTHER): Payer: Self-pay

## 2022-05-08 ENCOUNTER — Other Ambulatory Visit (HOSPITAL_BASED_OUTPATIENT_CLINIC_OR_DEPARTMENT_OTHER): Payer: Self-pay

## 2022-05-08 MED ORDER — LISDEXAMFETAMINE DIMESYLATE 20 MG PO CAPS
20.0000 mg | ORAL_CAPSULE | Freq: Every day | ORAL | 0 refills | Status: DC
  Filled 2022-05-08: qty 30, 30d supply, fill #0

## 2022-06-08 DIAGNOSIS — M1612 Unilateral primary osteoarthritis, left hip: Secondary | ICD-10-CM | POA: Diagnosis not present

## 2022-06-08 DIAGNOSIS — M170 Bilateral primary osteoarthritis of knee: Secondary | ICD-10-CM | POA: Diagnosis not present

## 2022-06-08 DIAGNOSIS — M7661 Achilles tendinitis, right leg: Secondary | ICD-10-CM | POA: Diagnosis not present

## 2022-06-10 ENCOUNTER — Other Ambulatory Visit: Payer: Self-pay | Admitting: Family

## 2022-06-10 ENCOUNTER — Other Ambulatory Visit (HOSPITAL_BASED_OUTPATIENT_CLINIC_OR_DEPARTMENT_OTHER): Payer: Self-pay

## 2022-06-10 MED ORDER — LISDEXAMFETAMINE DIMESYLATE 20 MG PO CAPS
20.0000 mg | ORAL_CAPSULE | Freq: Every day | ORAL | 0 refills | Status: DC
  Filled 2022-06-10 – 2022-06-22 (×3): qty 30, 30d supply, fill #0

## 2022-06-10 NOTE — Telephone Encounter (Signed)
Pt requesting increase to Vyvanse '30mg'$ . Please advise.

## 2022-06-15 DIAGNOSIS — M170 Bilateral primary osteoarthritis of knee: Secondary | ICD-10-CM | POA: Diagnosis not present

## 2022-06-19 ENCOUNTER — Other Ambulatory Visit (HOSPITAL_BASED_OUTPATIENT_CLINIC_OR_DEPARTMENT_OTHER): Payer: Self-pay

## 2022-06-19 ENCOUNTER — Encounter: Payer: BC Managed Care – PPO | Admitting: Family

## 2022-06-22 ENCOUNTER — Other Ambulatory Visit (HOSPITAL_BASED_OUTPATIENT_CLINIC_OR_DEPARTMENT_OTHER): Payer: Self-pay

## 2022-06-22 DIAGNOSIS — M7661 Achilles tendinitis, right leg: Secondary | ICD-10-CM | POA: Diagnosis not present

## 2022-06-22 DIAGNOSIS — M170 Bilateral primary osteoarthritis of knee: Secondary | ICD-10-CM | POA: Diagnosis not present

## 2022-06-23 ENCOUNTER — Encounter: Payer: BC Managed Care – PPO | Admitting: Family

## 2022-07-28 ENCOUNTER — Encounter: Payer: BC Managed Care – PPO | Admitting: Family

## 2022-07-30 NOTE — Therapy (Signed)
OUTPATIENT PHYSICAL THERAPY LOWER EXTREMITY EVALUATION   Patient Name: Tiffany Sharp MRN: [id number] DOB:December 22, 1970, 51 y.o., female Today's Date: 08/03/2022   PT End of Session - 08/03/22 0806     Visit Number 1    Number of Visits 12    Date for PT Re-Evaluation 09/14/22    Authorization Type BCBS    PT Start Time 0810    PT Stop Time 0911    PT Time Calculation (min) 61 min    Activity Tolerance Patient tolerated treatment well    Behavior During Therapy [facility] for tasks assessed/performed             Past Medical History:  Diagnosis Date   Abscess    Breast asymmetry in female 10/12/2012   Right breast on December 2013 mammogram and ultrasound Followup ultrasound in June 2014   Breast lump    had biopsy, benign   Breast lump 08/25/2013   R breast, [facility] at [facility] 09/2012, likely glandular tissue.  Recommended f/u Mg in 6 months (this was prior to pregnancy)   Depression    PPD - post partum depression   Eclampsia    Endometritis    HELLP (hemolytic anemia/elev liver enzymes/low platelets in pregnancy)    History of echocardiogram    Echo 2/17: EF 60-65%, normal wall motion, mild RVE, atrial septal aneurysm (findings consistent with increased left atrial pressure)   History of toxic shock syndrome 08/25/2013   HSV-2 (herpes simplex virus 2) infection    IBS (irritable bowel syndrome)    Knee instability    Murmur    told this as a child - no specifict testing ever done   Post partum depression    Postpartum hemorrhage    Pregnancy induced hypertension    no meds outside of pregnancy   Preterm delivery 09/02/2013   Rubella non-immune status, antepartum 08/25/2013   Toxic shock syndrome ([place]) 1997   started with an abscess right axilla   Past Surgical History:  Procedure Laterality Date   BREAST BIOPSY  2013   benign   DILATION AND CURETTAGE OF UTERUS     DILATION AND EVACUATION N/A 09/01/2013   Procedure: DILATATION AND EVACUATION;  Surgeon: Delice Lesch, MD;  Location: [facility];  Service: Gynecology;  Laterality: N/A;   INCISE AND DRAIN ABCESS  1996   under left arm   Patient Active Problem List   Diagnosis Date Noted   Seasonal allergies 12/31/2021   Hot flashes 06/16/2021   Pre-op examination 06/16/2021   Memory problem 05/16/2021   Gastroesophageal reflux disease 05/16/2021   Preventative health care 06/02/2016   Osteoarthritis 06/02/2016   Hyperglycemia 06/05/2015   ADHD (attention deficit hyperactivity disorder) 06/03/2015   Overweight 06/03/2015   Thrombocytopenia, unspecified ([place]) 08/28/2013   HSV (herpes simplex virus) infection 08/25/2013   H/O PPH (postpartum hemorrhage) 08/25/2013   Anemia 08/25/2013   Allergy to penicillin 08/25/2013   History of eclampsia 08/25/2013   Advanced maternal age (AMA), 33 years or greater 08/25/2013   Obesity, unspecified 08/25/2013   History of loop electrical excision procedure (LEEP) 08/25/2013   H/O LGA (large for gestational age) fetus 08/25/2013    PCP: Debbrah Alar, NP   REFERRING PROVIDER: Marchia Bond, MD  REFERRING DIAG: M76.60 (ICD-10-CM) - Achilles tendonitis  THERAPY DIAG:  Pain in right ankle and joints of right foot  Stiffness of right ankle, not elsewhere classified  Muscle weakness (generalized)  Muscle spasm of calf  Rationale for Evaluation  and Treatment Rehabilitation  ONSET DATE: 2 years ago  SUBJECTIVE:   SUBJECTIVE STATEMENT: Patient started having right achilles pain 2 summers ago but has been distracted by other issues including her left hip and knees. Left hip replacement last year. She has been limping for  a long time. It takes me a while to get moving in the morning and it's really tender. Having throbbing pain recently. Hard to walk dogs. Pain is worse when she first gets started and then it starts to feel better. Walking flat footed is the worst.  PERTINENT HISTORY: Left THA 2022, bil knee pain, Depression, heart murmur, one  seizure post pregnancy  PAIN:  Are you having pain? Yes: NPRS scale: 6-7/10 Pain location: right achilles tendon Pain description: throbbing, tender, dull Aggravating factors: descending stairs, walking Relieving factors: ice  PRECAUTIONS: None  WEIGHT BEARING RESTRICTIONS No  FALLS:  Has patient fallen in last 6 months? No  LIVING ENVIRONMENT: Lives with: lives with their spouse Lives in: House/apartment Stairs: Yes: Internal: 14 steps; on right going up and External: 3 steps; none Has following equipment at home: None  OCCUPATION: sits for work  PLOF: Independent  PATIENT GOALS stop the pain and get on with my life   OBJECTIVE:   DIAGNOSTIC FINDINGS: none  PATIENT SURVEYS:  LEFS 30/80 = 56.3% disability  COGNITION:  Overall cognitive status: Within functional limits for tasks assessed     SENSATION: WFL   MUSCLE LENGTH: UK:GURK tightness bil Quads: L 107 deg R 102 deg ITB:WNL Piriformis:right mod Hip Flexors: right tight Heelcords:see ROM   POSTURE:  mild left hip IR  PALPATION:  Palpation: TTP at right gastroc/soleus with increased tissue tension; marked tenderness at achilles insertion to calcaneus     LOWER EXTREMITY ROM:  A/P ROM Right eval Left eval  Hip flexion WNL WNL  Hip external rotation WNL WNL  Knee flexion 127 130  Knee extension -5 -3  Ankle dorsiflexion -5/0 -5/5  Ankle plantarflexion 60 63  Ankle inversion WNL WNL  Ankle eversion WNL WNL   (Blank rows = not tested)  LOWER EXTREMITY MMT:  MMT Right eval Left eval  Hip flexion 4 4+  Hip extension 4+ 5  Hip abduction 4+ 5  Hip adduction 5 5  Hip internal rotation    Hip external rotation    Knee flexion 4 5  Knee extension 4 4+  Ankle dorsiflexion 5 5  Ankle plantarflexion 10 and partial range 20 weakens for last 10 reps  Ankle inversion 4+ 4+  Ankle eversion 5 4+   (Blank rows = not tested)   GAIT: Distance walked: 60 Assistive device utilized:  None Level of assistance: Complete Independence Comments: decreased heel strike R    TODAY'S TREATMENT:  08/03/22 THERAPEUTIC EXERCISE:  to improve flexibility, strength and mobility.  Verbal and tactile cues throughout for technique  Bil heel raises at tempo 3 sec up/hold/down x  5 reps. Also attempted   single leg heel raise and double up and single down. R HS, quad stretch supine and quad stretch off EOB x 30 sec ea Gastroc and soleus stretch standing x 30 sec ea Seated soleus stretch x 30 sec  Modalities: Ionotophoresis with 1.0 mL '4mg'$ /ml Dexamethasone - 4-6 hour patch (12m-min) to right achilles tendon (patch #1 of 6)     PATIENT EDUCATION:  Education details: PT eval findings, anticipated POC, initial HEP, role of DN, and ionto education and precautions. Person educated: Patient Education method: EEducation officer, environmental Verbal  cues, and Handouts Education comprehension: verbalized understanding and returned demonstration   HOME EXERCISE PROGRAM: Access Code: [email or web address] URL: [email or web address] Date: 08/03/2022 Prepared by: Almyra Free  Exercises - Seated Ankle Dorsiflexion Stretch  - 2 x daily - 7 x weekly - 1 sets - 2 reps - 30" hold - Gastroc Stretch on Wall  - 2 x daily - 7 x weekly - 1 sets - 2 reps - 30" hold - Soleus Stretch on Wall  - 2 x daily - 7 x weekly - 1 sets - 2 reps - 30" hold - Standing Heel Raise with Chair Support  - 1 x daily - 4 x weekly - 3 sets - 10 reps - Supine Hamstring Stretch with Strap  - 2 x daily - 7 x weekly - 1 sets - 3 reps - 60 sec hold - Prone Quadriceps Stretch with Strap  - 2-3 x daily - 7 x weekly - 1 sets - 3 reps - 60 sec hold - Supine Quadriceps Stretch with Strap on Table  - 2 x daily - 7 x weekly - 1 sets - 3 reps - 30-60 sec hold  ASSESSMENT:  CLINICAL IMPRESSION: TATA TIMMINS is a 51 y.o. female who was seen today for physical therapy evaluation and treatment for right achilles tendinitis. She reports  onset of right ankle pain beginning about 2 years ago and states it was likely a result of her bil knee pain and left hip pain at the time. Pt  ambulates with decreased heel strike on right. Pain is constant and worse with initiating walking and descending stairs. She has deficits in bil ankle DF, hip and ankle strength and hip/knee and ankle flexibility.  Taleen will benefit from skilled PT to address these deficits.    OBJECTIVE IMPAIRMENTS Abnormal gait, decreased activity tolerance, difficulty walking, decreased ROM, decreased strength, increased fascial restrictions, increased muscle spasms, impaired flexibility, and pain.   ACTIVITY LIMITATIONS stairs and locomotion level  PARTICIPATION LIMITATIONS:  does ADLS with pain  PERSONAL FACTORS Time since onset of injury/illness/exacerbation and 3+ comorbidities: L THA, bil knee pain and depression are also affecting patient's functional outcome.   REHAB POTENTIAL: Excellent  CLINICAL DECISION MAKING: Stable/uncomplicated  EVALUATION COMPLEXITY: Low   GOALS: Goals reviewed with patient? Yes  SHORT TERM GOALS: Target date: 08/17/2022 (Remove Blue Hyperlink)  Patient will be independent with initial HEP. Baseline:  Goal status: INITIAL     LONG TERM GOALS: Target date: 09/14/2022 (Remove Blue Hyperlink)  Patient will be independent with advanced/ongoing HEP to improve outcomes and carryover.  Baseline:  Goal status: INITIAL  2.  Patient will report at least 75% improvement in R ankle pain to improve QOL. Baseline:  Goal status: INITIAL  3.  Patient will demonstrate improved active R ankle DF to  +8 deg or better  to allow for normal gait and stair mechanics. Baseline:  Goal status: INITIAL  4.  Patient will demonstrate improved LE strength to 5/5 to improve function. Baseline:  Goal status: INITIAL  5.  Patient will be able to ascend/descend stairs with 1 HR and reciprocal step pattern safely to access home and  community.  Baseline:  Goal status: INITIAL  7.  Patient will report >=54/80 on LEFS  to demonstrate improved functional ability. Baseline: 45/80 Goal status: INITIAL  8.  Patient will be able to walk her dog without difficulty due to R ankle deficits. Baseline:  Goal status: INITIAL    PLAN: PT FREQUENCY: 2x/week  PT DURATION: 6  weeks  PLANNED INTERVENTIONS: Therapeutic exercises, Therapeutic activity, Neuromuscular re-education, Balance training, Gait training, Patient/Family education, Self Care, Joint mobilization, Joint manipulation, Stair training, Dry Needling, Electrical stimulation, Cryotherapy, Moist heat, Taping, Ultrasound, Ionotophoresis '4mg'$ /ml Dexamethasone, and Manual therapy  PLAN FOR NEXT SESSION: Review and progress HEP, assess ionto, ROM, strengthening, manual/DN to R gastroc/soleus   Reeta Kuk, PT 08/03/2022, 9:46 AM

## 2022-08-03 ENCOUNTER — Other Ambulatory Visit: Payer: Self-pay

## 2022-08-03 ENCOUNTER — Ambulatory Visit: Payer: BC Managed Care – PPO | Attending: Orthopedic Surgery | Admitting: Physical Therapy

## 2022-08-03 ENCOUNTER — Encounter: Payer: Self-pay | Admitting: Physical Therapy

## 2022-08-03 DIAGNOSIS — M25671 Stiffness of right ankle, not elsewhere classified: Secondary | ICD-10-CM | POA: Diagnosis not present

## 2022-08-03 DIAGNOSIS — M6281 Muscle weakness (generalized): Secondary | ICD-10-CM | POA: Diagnosis not present

## 2022-08-03 DIAGNOSIS — M62831 Muscle spasm of calf: Secondary | ICD-10-CM | POA: Insufficient documentation

## 2022-08-03 DIAGNOSIS — M25571 Pain in right ankle and joints of right foot: Secondary | ICD-10-CM | POA: Diagnosis not present

## 2022-08-04 ENCOUNTER — Encounter: Payer: BC Managed Care – PPO | Admitting: Family

## 2022-08-05 ENCOUNTER — Telehealth: Payer: Self-pay | Admitting: Family

## 2022-08-05 ENCOUNTER — Encounter: Payer: Self-pay | Admitting: Family

## 2022-08-05 ENCOUNTER — Ambulatory Visit (INDEPENDENT_AMBULATORY_CARE_PROVIDER_SITE_OTHER): Payer: BC Managed Care – PPO | Admitting: Family

## 2022-08-05 VITALS — BP 120/82 | HR 82 | Temp 98.3°F | Ht 71.0 in | Wt 237.6 lb

## 2022-08-05 DIAGNOSIS — Z23 Encounter for immunization: Secondary | ICD-10-CM

## 2022-08-05 DIAGNOSIS — F909 Attention-deficit hyperactivity disorder, unspecified type: Secondary | ICD-10-CM | POA: Diagnosis not present

## 2022-08-05 DIAGNOSIS — Z0000 Encounter for general adult medical examination without abnormal findings: Secondary | ICD-10-CM | POA: Diagnosis not present

## 2022-08-05 NOTE — Telephone Encounter (Signed)
Pap and mammogram requested.

## 2022-08-05 NOTE — Assessment & Plan Note (Signed)
Continue healthy diet, exercise and weight loss efforts.  Colo up to date. Flu shot today. Recommended that she get covid booster at the pharmacy. She will schedule dental.  She will schedule mammogram/pap with GYN.

## 2022-08-05 NOTE — Assessment & Plan Note (Signed)
Well controlled when she takes the vyvanse. If she takes it too late in the day, she has insomnia. Recommended that she set a alarm and try to take first thing every day.

## 2022-08-05 NOTE — Telephone Encounter (Signed)
Please call [facility] to request pap smear and mammogram.

## 2022-08-05 NOTE — Progress Notes (Signed)
Subjective:     Patient ID: Tiffany Sharp, female    DOB: 14-Mar-1971, 51 y.o.   MRN: [id number]  Chief Complaint  Patient presents with   Annual Exam    HPI  Patient presents today for complete physical.  Immunizations: Shingrix x 2, tetanus 11/14, flu shot today Diet: She has lost 8 pounds.  Back on track Wt Readings from Last 3 Encounters:  08/05/22 237 lb 9.6 oz (107.8 kg)  01/26/22 234 lb (106.1 kg)  12/31/21 233 lb (105.7 kg)  Exercise: 10-15000 steps/day Colonoscopy: 2022 Pap Smear:  not sure will follow up with gyn Mammogram: due will complete at gyn. Vision: up to date Dental: will schedule  ADHD- sometimes she forgets to take the vyvanse. On the days that she takes vyvanse she is able to focus but can't sleep if she takes it too late.    Health Maintenance Due  Topic Date Due   Hepatitis C Screening  Never done   PAP SMEAR-Modifier  02/23/2020   COVID-19 Vaccine (3 - Pfizer series) 03/20/2020    Past Medical History:  Diagnosis Date   Abscess    Breast asymmetry in female 10/12/2012   Right breast on December 2013 mammogram and ultrasound Followup ultrasound in June 2014   Breast lump    had biopsy, benign   Breast lump 08/25/2013   R breast, seen at [facility] 09/2012, likely glandular tissue.  Recommended f/u Mg in 6 months (this was prior to pregnancy)   Depression    PPD - post partum depression   Eclampsia    Endometritis    HELLP (hemolytic anemia/elev liver enzymes/low platelets in pregnancy)    History of echocardiogram    Echo 2/17: EF 60-65%, normal wall motion, mild RVE, atrial septal aneurysm (findings consistent with increased left atrial pressure)   History of toxic shock syndrome 08/25/2013   HSV-2 (herpes simplex virus 2) infection    IBS (irritable bowel syndrome)    Knee instability    Murmur    told this as a child - no specifict testing ever done   Post partum depression    Postpartum hemorrhage    Pregnancy induced  hypertension    no meds outside of pregnancy   Preterm delivery 09/02/2013   Rubella non-immune status, antepartum 08/25/2013   Toxic shock syndrome ([place]) 1997   started with an abscess right axilla    Past Surgical History:  Procedure Laterality Date   BREAST BIOPSY  2013   benign   DILATION AND CURETTAGE OF UTERUS     DILATION AND EVACUATION N/A 09/01/2013   Procedure: DILATATION AND EVACUATION;  Surgeon: Delice Lesch, MD;  Location: [facility];  Service: Gynecology;  Laterality: N/A;   INCISE AND DRAIN ABCESS  1996   under left arm    Family History  Problem Relation Age of Onset   Cancer Mother        rare form of melanoma (hands)   Hypertension Father    Heart disease Father        chf, no CAD   Transient ischemic attack Father    Bipolar disorder Sister    Obesity Sister    Asthma Brother    Depression Brother    Obesity Brother    Depression Maternal Grandmother        agoraphobia   Stomach cancer Maternal Grandfather    Alzheimer's disease Maternal Grandfather    Hypertension Paternal Grandmother    Hypertension Paternal Merchant navy officer  Heart attack Neg Hx    Colon polyps Neg Hx    Colon cancer Neg Hx    Esophageal cancer Neg Hx    Rectal cancer Neg Hx     Social History   Socioeconomic History   Marital status: Married    Spouse name: Not on file   Number of children: Not on file   Years of education: Not on file   Highest education level: Not on file  Occupational History   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  Tobacco Use   Smoking status: Never   Smokeless tobacco: Never  Vaping Use   Vaping Use: Never used  Substance and Sexual Activity   Alcohol use: No   Drug use: No   Sexual activity: Not Currently    Birth control/protection: None  Other Topics Concern   Not on file  Social History Narrative   Married   5 daughters:   2000   2002   2004   2008   2014   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 Mix in drink     DIGESTIVE ENZYMES PO Take 1 tablet by mouth daily.     docusate sodium (COLACE) 100 MG capsule Take 100 mg by mouth daily.     lisdexamfetamine (VYVANSE) 20 MG capsule Take 1 capsule (20 mg total) by mouth daily. 30 capsule 0   Multiple Vitamins-Minerals (MULTIVITAMIN WITH MINERALS) tablet Take 1 tablet by mouth daily.     gabapentin (NEURONTIN) 300 MG capsule Take 1 capsule (300 mg total) by mouth at bedtime. (Patient not taking: Reported on 08/03/2022) 90 capsule 1   No facility-administered medications prior to visit.    Allergies  Allergen Reactions   Penicillins Hives   Sulfa Antibiotics Hives    Review of Systems  Constitutional:  Positive for weight loss.  HENT:  Negative for congestion and hearing loss.   Eyes:  Negative for blurred vision.  Respiratory:  Negative for cough.   Gastrointestinal:  Negative for constipation and diarrhea.  Genitourinary:  Negative for dysuria and frequency.  Musculoskeletal:  Negative for joint pain and myalgias.  Skin:  Negative for rash.  Neurological:  Negative for headaches.  Psychiatric/Behavioral:         Denies depression/anxiety       Objective:    Physical Exam  BP 120/82 (BP Location: Right Arm, Patient Position: Sitting, Cuff Size: Large)   Pulse 82   Temp 98.3 F (36.8 C) (Oral)   Ht '5\' 11"'$  (1.803 m)   Wt 237 lb 9.6 oz (107.8 kg)   SpO2 96%   BMI 33.14 kg/m  Wt Readings from Last 3 Encounters:  08/05/22 237 lb 9.6 oz (107.8 kg)  01/26/22 234 lb (106.1 kg)  12/31/21 233 lb  (105.7 kg)   Physical Exam  Constitutional: She is oriented to person, place, and time. She appears well-developed and well-nourished. No distress.  HENT:  Head: Normocephalic and atraumatic.  Right Ear: Tympanic membrane and ear canal normal.  Left Ear:  Tympanic membrane and ear canal normal.  Mouth/Throat: Oropharynx is clear and moist.  Eyes: Pupils are equal, round, and reactive to light. No scleral icterus.  Neck: Normal range of motion. No thyromegaly present.  Cardiovascular: Normal rate and regular rhythm.   No murmur heard. Pulmonary/Chest: Effort normal and breath sounds normal. No respiratory distress. He has no wheezes. She has no rales. She exhibits no tenderness.  Abdominal: Soft. Bowel sounds are normal. She exhibits no distension and no mass. There is no tenderness. There is no rebound and no guarding.  Musculoskeletal: She exhibits no edema.  Lymphadenopathy:    She has no cervical adenopathy.  Neurological: She is alert and oriented to person, place, and time. She has normal patellar reflexes. She exhibits normal muscle tone. Coordination normal.  Skin: Skin is warm and dry.  Psychiatric: She has a normal mood and affect. Her behavior is normal. Judgment and thought content normal.  Breast/pelvis: deferred to [place]:       Assessment & Plan:   Problem List Items Addressed This Visit       Unprioritized   Preventative health care - Primary    Continue healthy diet, exercise and weight loss efforts.  Colo up to date. Flu shot today. Recommended that she get covid booster at the pharmacy. She will schedule dental.  She will schedule mammogram/pap with GYN.       ADHD (attention deficit hyperactivity disorder)    Well controlled when she takes the vyvanse. If she takes it too late in the day, she has insomnia. Recommended that she set a alarm and try to take first thing every day.      Other Visit Diagnoses     Need for influenza  vaccination       Relevant Orders   Flu Vaccine QUAD 96moIM (Fluarix, Fluzone & Alfiuria Quad PF) (Completed)       I am having Ashelynn B. Grinnell maintain her DIGESTIVE ENZYMES PO, docusate sodium, Cyanocobalamin (B-12 PO), multivitamin with minerals, gabapentin, and lisdexamfetamine.  No orders of the defined types were placed in this encounter.

## 2022-08-06 ENCOUNTER — Ambulatory Visit: Payer: BC Managed Care – PPO | Admitting: Physical Therapy

## 2022-08-10 ENCOUNTER — Encounter: Payer: BC Managed Care – PPO | Admitting: Physical Therapy

## 2022-08-12 ENCOUNTER — Encounter: Payer: BC Managed Care – PPO | Admitting: Physical Therapy

## 2022-08-13 ENCOUNTER — Other Ambulatory Visit: Payer: Self-pay | Admitting: Family

## 2022-08-13 NOTE — Telephone Encounter (Signed)
Requesting: Vyvanse '20mg'$   Contract: 11/26/21 UDS: 11/26/21 Last Visit: 08/05/22 Next Visit: None Last Refill: 06/10/22 #30 and 0RF   Pt requesting generic   Please Advise

## 2022-08-14 ENCOUNTER — Other Ambulatory Visit (HOSPITAL_BASED_OUTPATIENT_CLINIC_OR_DEPARTMENT_OTHER): Payer: Self-pay

## 2022-08-14 MED ORDER — LISDEXAMFETAMINE DIMESYLATE 20 MG PO CAPS
20.0000 mg | ORAL_CAPSULE | Freq: Every day | ORAL | 0 refills | Status: DC
  Filled 2022-08-14: qty 30, 30d supply, fill #0

## 2022-08-17 ENCOUNTER — Ambulatory Visit: Payer: BC Managed Care – PPO

## 2022-08-17 DIAGNOSIS — M25571 Pain in right ankle and joints of right foot: Secondary | ICD-10-CM | POA: Diagnosis not present

## 2022-08-17 DIAGNOSIS — M25671 Stiffness of right ankle, not elsewhere classified: Secondary | ICD-10-CM

## 2022-08-17 DIAGNOSIS — M62831 Muscle spasm of calf: Secondary | ICD-10-CM

## 2022-08-17 DIAGNOSIS — M6281 Muscle weakness (generalized): Secondary | ICD-10-CM

## 2022-08-17 NOTE — Therapy (Signed)
OUTPATIENT PHYSICAL THERAPY TREATMENT   Patient Name: Tiffany Sharp MRN: [id number] DOB:09-13-1971, 51 y.o., female Today's Date: 08/17/2022     Past Medical History:  Diagnosis Date   Abscess    Breast asymmetry in female 10/12/2012   Right breast on December 2013 mammogram and ultrasound Followup ultrasound in June 2014   Breast lump    had biopsy, benign   Breast lump 08/25/2013   R breast, seen at [facility] 09/2012, likely glandular tissue.  Recommended f/u Mg in 6 months (this was prior to pregnancy)   Depression    PPD - post partum depression   Eclampsia    Endometritis    HELLP (hemolytic anemia/elev liver enzymes/low platelets in pregnancy)    History of echocardiogram    Echo 2/17: EF 60-65%, normal wall motion, mild RVE, atrial septal aneurysm (findings consistent with increased left atrial pressure)   History of toxic shock syndrome 08/25/2013   HSV-2 (herpes simplex virus 2) infection    IBS (irritable bowel syndrome)    Knee instability    Murmur    told this as a child - no specifict testing ever done   Post partum depression    Postpartum hemorrhage    Pregnancy induced hypertension    no meds outside of pregnancy   Preterm delivery 09/02/2013   Rubella non-immune status, antepartum 08/25/2013   Toxic shock syndrome ([place]) 1997   started with an abscess right axilla   Past Surgical History:  Procedure Laterality Date   BREAST BIOPSY  2013   benign   DILATION AND CURETTAGE OF UTERUS     DILATION AND EVACUATION N/A 09/01/2013   Procedure: DILATATION AND EVACUATION;  Surgeon: Delice Lesch, MD;  Location: [facility];  Service: Gynecology;  Laterality: N/A;   INCISE AND DRAIN ABCESS  1996   under left arm   Patient Active Problem List   Diagnosis Date Noted   Seasonal allergies 12/31/2021   Hot flashes 06/16/2021   Pre-op examination 06/16/2021   Memory problem 05/16/2021   Gastroesophageal reflux disease 05/16/2021   Preventative health care  06/02/2016   Osteoarthritis 06/02/2016   Hyperglycemia 06/05/2015   ADHD (attention deficit hyperactivity disorder) 06/03/2015   Overweight 06/03/2015   Thrombocytopenia, unspecified ([place]) 08/28/2013   HSV (herpes simplex virus) infection 08/25/2013   H/O PPH (postpartum hemorrhage) 08/25/2013   Anemia 08/25/2013   Allergy to penicillin 08/25/2013   History of eclampsia 08/25/2013   Advanced maternal age (AMA), 15 years or greater 08/25/2013   Obesity, unspecified 08/25/2013   History of loop electrical excision procedure (LEEP) 08/25/2013   H/O LGA (large for gestational age) fetus 08/25/2013    PCP: Debbrah Alar, NP   REFERRING PROVIDER: Marchia Bond, MD  REFERRING DIAG: M76.60 (ICD-10-CM) - Achilles tendonitis  THERAPY DIAG:  Pain in right ankle and joints of right foot  Stiffness of right ankle, not elsewhere classified  Muscle weakness (generalized)  Muscle spasm of calf  Rationale for Evaluation and Treatment Rehabilitation  ONSET DATE: 2 years ago  SUBJECTIVE:   SUBJECTIVE STATEMENT: Pt reports her pain has not seemed to improve. She stopped doing exercises because it seemed to worsen.   PERTINENT HISTORY: Left THA 2022, bil knee pain, Depression, heart murmur, one seizure post pregnancy  PAIN:  Are you having pain? Yes: NPRS scale: 5/10 Pain location: right achilles tendon Pain description: burning, sharp at times Aggravating factors: descending stairs, walking Relieving factors: ice  PRECAUTIONS: None  WEIGHT BEARING RESTRICTIONS No  FALLS:  Has patient fallen in last 6 months? No  LIVING ENVIRONMENT: Lives with: lives with their spouse Lives in: House/apartment Stairs: Yes: Internal: 14 steps; on right going up and External: 3 steps; none Has following equipment at home: None  OCCUPATION: sits for work  PLOF: Independent  PATIENT GOALS stop the pain and get on with my life   OBJECTIVE:   DIAGNOSTIC FINDINGS: none  PATIENT  SURVEYS:  LEFS 30/80 = 56.3% disability  COGNITION:  Overall cognitive status: Within functional limits for tasks assessed     SENSATION: WFL   MUSCLE LENGTH: TF:TDDU tightness bil Quads: L 107 deg R 102 deg ITB:WNL Piriformis:right mod Hip Flexors: right tight Heelcords:see ROM   POSTURE:  mild left hip IR  PALPATION:  Palpation: TTP at right gastroc/soleus with increased tissue tension; marked tenderness at achilles insertion to calcaneus     LOWER EXTREMITY ROM:  A/P ROM Right eval Left eval  Hip flexion WNL WNL  Hip external rotation WNL WNL  Knee flexion 127 130  Knee extension -5 -3  Ankle dorsiflexion -5/0 -5/5  Ankle plantarflexion 60 63  Ankle inversion WNL WNL  Ankle eversion WNL WNL   (Blank rows = not tested)  LOWER EXTREMITY MMT:  MMT Right eval Left eval  Hip flexion 4 4+  Hip extension 4+ 5  Hip abduction 4+ 5  Hip adduction 5 5  Hip internal rotation    Hip external rotation    Knee flexion 4 5  Knee extension 4 4+  Ankle dorsiflexion 5 5  Ankle plantarflexion 10 and partial range 20 weakens for last 10 reps  Ankle inversion 4+ 4+  Ankle eversion 5 4+   (Blank rows = not tested)   GAIT: Distance walked: 60 Assistive device utilized: None Level of assistance: Complete Independence Comments: decreased heel strike R    TODAY'S TREATMENT: 08/17/22 TherEx: Nustep L3x78mn Standing R gastroc stretch on step 3x10"  Manual Therapy: STM to achilles insertion, gastroc/soleus  Ultrasound: 8 min to achilles 3 Mhz 1.2 intensity   08/03/22 THERAPEUTIC EXERCISE:  to improve flexibility, strength and mobility.  Verbal and tactile cues throughout for technique  Bil heel raises at tempo 3 sec up/hold/down x  5 reps. Also attempted   single leg heel raise and double up and single down. R HS, quad stretch supine and quad stretch off EOB x 30 sec ea Gastroc and soleus stretch standing x 30 sec ea Seated soleus stretch x 30  sec  Modalities: Ionotophoresis with 1.0 mL '4mg'$ /ml Dexamethasone - 4-6 hour patch (856mmin) to right achilles tendon (patch #1 of 6)     PATIENT EDUCATION:  Education details: PT eval findings, anticipated POC, initial HEP, role of DN, and ionto education and precautions. Person educated: Patient Education method: Explanation, Demonstration, Verbal cues, and Handouts Education comprehension: verbalized understanding and returned demonstration   HOME EXERCISE PROGRAM: Access Code: [email or web address] URL: [email or web address] Date: 08/03/2022 Prepared by: JuAlmyra FreeExercises - Seated Ankle Dorsiflexion Stretch  - 2 x daily - 7 x weekly - 1 sets - 2 reps - 30" hold - Gastroc Stretch on Wall  - 2 x daily - 7 x weekly - 1 sets - 2 reps - 30" hold - Soleus Stretch on Wall  - 2 x daily - 7 x weekly - 1 sets - 2 reps - 30" hold - Standing Heel Raise with Chair Support  - 1 x daily - 4 x weekly - 3 sets - 10 reps -  Supine Hamstring Stretch with Strap  - 2 x daily - 7 x weekly - 1 sets - 3 reps - 60 sec hold - Prone Quadriceps Stretch with Strap  - 2-3 x daily - 7 x weekly - 1 sets - 3 reps - 60 sec hold - Supine Quadriceps Stretch with Strap on Table  - 2 x daily - 7 x weekly - 1 sets - 3 reps - 30-60 sec hold  ASSESSMENT:  CLINICAL IMPRESSION: Pt noted feeling like her pain is not improving. She feels like the ionto patch helped but 3 days later had pain again.Today we tried MT to focus on decreasing pain. After STM patient was unsure whether it helped or not. With the standing gastroc stretch she reported feeling most of pull along the achilles tendon area with some burning. We also tried [place] to the achilles tendon area in hope to decrease her pain and promote healing.    OBJECTIVE IMPAIRMENTS Abnormal gait, decreased activity tolerance, difficulty walking, decreased ROM, decreased strength, increased fascial restrictions, increased muscle spasms, impaired flexibility, and pain.    ACTIVITY LIMITATIONS stairs and locomotion level  PARTICIPATION LIMITATIONS:  does ADLS with pain  PERSONAL FACTORS Time since onset of injury/illness/exacerbation and 3+ comorbidities: L THA, bil knee pain and depression are also affecting patient's functional outcome.   REHAB POTENTIAL: Excellent  CLINICAL DECISION MAKING: Stable/uncomplicated  EVALUATION COMPLEXITY: Low   GOALS: Goals reviewed with patient? Yes  SHORT TERM GOALS: Target date: 08/17/2022 (Remove Blue Hyperlink)  Patient will be independent with initial HEP. Baseline:  Goal status: IN PROGRESS     LONG TERM GOALS: Target date: 09/14/2022 (Remove Blue Hyperlink)  Patient will be independent with advanced/ongoing HEP to improve outcomes and carryover.  Baseline:  Goal status: IN PROGRESS  2.  Patient will report at least 75% improvement in R ankle pain to improve QOL. Baseline:  Goal status: IN PROGRESS  3.  Patient will demonstrate improved active R ankle DF to  +8 deg or better  to allow for normal gait and stair mechanics. Baseline:  Goal status: IN PROGRESS  4.  Patient will demonstrate improved LE strength to 5/5 to improve function. Baseline:  Goal status: IN PROGRESS  5.  Patient will be able to ascend/descend stairs with 1 HR and reciprocal step pattern safely to access home and community.  Baseline:  Goal status: IN PROGRESS  7.  Patient will report >=54/80 on LEFS  to demonstrate improved functional ability. Baseline: 45/80 Goal status: IN PROGRESS  8.  Patient will be able to walk her dog without difficulty due to R ankle deficits. Baseline:  Goal status: IN PROGRESS    PLAN: PT FREQUENCY: 2x/week  PT DURATION: 6 weeks  PLANNED INTERVENTIONS: Therapeutic exercises, Therapeutic activity, Neuromuscular re-education, Balance training, Gait training, Patient/Family education, Self Care, Joint mobilization, Joint manipulation, Stair training, Dry Needling, Electrical stimulation,  Cryotherapy, Moist heat, Taping, Ultrasound, Ionotophoresis '4mg'$ /ml Dexamethasone, and Manual therapy  PLAN FOR NEXT SESSION: Review and progress HEP, assess ionto, ROM, strengthening, manual/DN to R [employer], PTA 08/17/2022, 11:06 AM

## 2022-08-18 ENCOUNTER — Encounter: Payer: BC Managed Care – PPO | Admitting: Physical Therapy

## 2022-08-21 ENCOUNTER — Ambulatory Visit: Payer: BC Managed Care – PPO

## 2022-08-21 DIAGNOSIS — M62831 Muscle spasm of calf: Secondary | ICD-10-CM

## 2022-08-21 DIAGNOSIS — M25671 Stiffness of right ankle, not elsewhere classified: Secondary | ICD-10-CM

## 2022-08-21 DIAGNOSIS — M6281 Muscle weakness (generalized): Secondary | ICD-10-CM | POA: Diagnosis not present

## 2022-08-21 DIAGNOSIS — M25571 Pain in right ankle and joints of right foot: Secondary | ICD-10-CM | POA: Diagnosis not present

## 2022-08-21 NOTE — Therapy (Signed)
OUTPATIENT PHYSICAL THERAPY TREATMENT   Patient Name: Tiffany Sharp MRN: [id number] DOB:Feb 18, 1971, 51 y.o., female Today's Date: 08/21/2022   PT End of Session - 08/21/22 0849     Visit Number 3    Number of Visits 12    Date for PT Re-Evaluation 09/14/22    Authorization Type BCBS    PT Start Time [phone]   pt late   PT Stop Time 0846    PT Time Calculation (min) 34 min    Activity Tolerance Patient tolerated treatment well    Behavior During Therapy [facility] for tasks assessed/performed              Past Medical History:  Diagnosis Date   Abscess    Breast asymmetry in female 10/12/2012   Right breast on December 2013 mammogram and ultrasound Followup ultrasound in June 2014   Breast lump    had biopsy, benign   Breast lump 08/25/2013   R breast, seen at [facility] 09/2012, likely glandular tissue.  Recommended f/u Mg in 6 months (this was prior to pregnancy)   Depression    PPD - post partum depression   Eclampsia    Endometritis    HELLP (hemolytic anemia/elev liver enzymes/low platelets in pregnancy)    History of echocardiogram    Echo 2/17: EF 60-65%, normal wall motion, mild RVE, atrial septal aneurysm (findings consistent with increased left atrial pressure)   History of toxic shock syndrome 08/25/2013   HSV-2 (herpes simplex virus 2) infection    IBS (irritable bowel syndrome)    Knee instability    Murmur    told this as a child - no specifict testing ever done   Post partum depression    Postpartum hemorrhage    Pregnancy induced hypertension    no meds outside of pregnancy   Preterm delivery 09/02/2013   Rubella non-immune status, antepartum 08/25/2013   Toxic shock syndrome ([place]) 1997   started with an abscess right axilla   Past Surgical History:  Procedure Laterality Date   BREAST BIOPSY  2013   benign   DILATION AND CURETTAGE OF UTERUS     DILATION AND EVACUATION N/A 09/01/2013   Procedure: DILATATION AND EVACUATION;  Surgeon: Delice Lesch, MD;  Location: [place] [facility];  Service: Gynecology;  Laterality: N/A;   INCISE AND DRAIN ABCESS  1996   under left arm   Patient Active Problem List   Diagnosis Date Noted   Seasonal allergies 12/31/2021   Hot flashes 06/16/2021   Pre-op examination 06/16/2021   Memory problem 05/16/2021   Gastroesophageal reflux disease 05/16/2021   Preventative health care 06/02/2016   Osteoarthritis 06/02/2016   Hyperglycemia 06/05/2015   ADHD (attention deficit hyperactivity disorder) 06/03/2015   Overweight 06/03/2015   Thrombocytopenia, unspecified ([place]) 08/28/2013   HSV (herpes simplex virus) infection 08/25/2013   H/O PPH (postpartum hemorrhage) 08/25/2013   Anemia 08/25/2013   Allergy to penicillin 08/25/2013   History of eclampsia 08/25/2013   Advanced maternal age (AMA), 56 years or greater 08/25/2013   Obesity, unspecified 08/25/2013   History of loop electrical excision procedure (LEEP) 08/25/2013   H/O LGA (large for gestational age) fetus 08/25/2013    PCP: Debbrah Alar, NP   REFERRING PROVIDER: Marchia Bond, MD  REFERRING DIAG: M76.60 (ICD-10-CM) - Achilles tendonitis  THERAPY DIAG:  Pain in right ankle and joints of right foot  Stiffness of right ankle, not elsewhere classified  Muscle weakness (generalized)  Muscle spasm of calf  Rationale  for Evaluation and Treatment Rehabilitation  ONSET DATE: 2 years ago  SUBJECTIVE:   SUBJECTIVE STATEMENT: Pt reports that she feels much better today.  PERTINENT HISTORY: Left THA 2022, bil knee pain, Depression, heart murmur, one seizure post pregnancy  PAIN:  Are you having pain? Yes: NPRS scale: 2/10 Pain location: right achilles tendon Pain description: sore, achy Aggravating factors: descending stairs, walking Relieving factors: ice  PRECAUTIONS: None  WEIGHT BEARING RESTRICTIONS No  FALLS:  Has patient fallen in last 6 months? No  LIVING ENVIRONMENT: Lives with: lives with their spouse Lives  in: House/apartment Stairs: Yes: Internal: 14 steps; on right going up and External: 3 steps; none Has following equipment at home: None  OCCUPATION: sits for work  PLOF: Independent  PATIENT GOALS stop the pain and get on with my life   OBJECTIVE:   DIAGNOSTIC FINDINGS: none  PATIENT SURVEYS:  LEFS 30/80 = 56.3% disability  COGNITION:  Overall cognitive status: Within functional limits for tasks assessed     SENSATION: WFL   MUSCLE LENGTH: MI:WOEH tightness bil Quads: L 107 deg R 102 deg ITB:WNL Piriformis:right mod Hip Flexors: right tight Heelcords:see ROM   POSTURE:  mild left hip IR  PALPATION:  Palpation: TTP at right gastroc/soleus with increased tissue tension; marked tenderness at achilles insertion to calcaneus     LOWER EXTREMITY ROM:  A/P ROM Right eval Left eval  Hip flexion WNL WNL  Hip external rotation WNL WNL  Knee flexion 127 130  Knee extension -5 -3  Ankle dorsiflexion -5/0 -5/5  Ankle plantarflexion 60 63  Ankle inversion WNL WNL  Ankle eversion WNL WNL   (Blank rows = not tested)  LOWER EXTREMITY MMT:  MMT Right eval Left eval  Hip flexion 4 4+  Hip extension 4+ 5  Hip abduction 4+ 5  Hip adduction 5 5  Hip internal rotation    Hip external rotation    Knee flexion 4 5  Knee extension 4 4+  Ankle dorsiflexion 5 5  Ankle plantarflexion 10 and partial range 20 weakens for last 10 reps  Ankle inversion 4+ 4+  Ankle eversion 5 4+   (Blank rows = not tested)   GAIT: Distance walked: 60 Assistive device utilized: None Level of assistance: Complete Independence Comments: decreased heel strike R    TODAY'S TREATMENT: 08/21/22 THERAPEUTIC EXERCISE:  to improve flexibility, strength and mobility.  Verbal and tactile cues throughout for technique Nustep L5x45mn Standing R runner stretch x 30 Standing soleus stretch x 30 at wall 4 way ankle RTB x 10 each direction  Ultrasound: 8 min to achilles 3 Mhz 1.2  intensity  08/17/22 TherEx: Nustep L3x646m Standing R gastroc stretch on step 3x10"  Manual Therapy: STM to achilles insertion, gastroc/soleus  Ultrasound: 8 min to achilles 3 Mhz 1.2 intensity   08/03/22 THERAPEUTIC EXERCISE:  to improve flexibility, strength and mobility.  Verbal and tactile cues throughout for technique  Bil heel raises at tempo 3 sec up/hold/down x  5 reps. Also attempted   single leg heel raise and double up and single down. R HS, quad stretch supine and quad stretch off EOB x 30 sec ea Gastroc and soleus stretch standing x 30 sec ea Seated soleus stretch x 30 sec  Modalities: Ionotophoresis with 1.0 mL 36m336ml Dexamethasone - 4-6 hour patch (10m22mn) to right achilles tendon (patch #1 of 6)     PATIENT EDUCATION:  Education details: PT eval findings, anticipated POC, initial HEP, role of DN, and ionto  education and precautions. Person educated: Patient Education method: Explanation, Demonstration, Verbal cues, and Handouts Education comprehension: verbalized understanding and returned demonstration   HOME EXERCISE PROGRAM: Access Code: [email or web address] URL: [email or web address] Date: 08/03/2022 Prepared by: Almyra Free  Exercises - Seated Ankle Dorsiflexion Stretch  - 2 x daily - 7 x weekly - 1 sets - 2 reps - 30" hold - Gastroc Stretch on Wall  - 2 x daily - 7 x weekly - 1 sets - 2 reps - 30" hold - Soleus Stretch on Wall  - 2 x daily - 7 x weekly - 1 sets - 2 reps - 30" hold - Standing Heel Raise with Chair Support  - 1 x daily - 4 x weekly - 3 sets - 10 reps - Supine Hamstring Stretch with Strap  - 2 x daily - 7 x weekly - 1 sets - 3 reps - 60 sec hold - Prone Quadriceps Stretch with Strap  - 2-3 x daily - 7 x weekly - 1 sets - 3 reps - 60 sec hold - Supine Quadriceps Stretch with Strap on Table  - 2 x daily - 7 x weekly - 1 sets - 3 reps - 30-60 sec hold  ASSESSMENT:  CLINICAL IMPRESSION: Pt arrived late to visit. She noted improvement  from [place] from last visit and noted less pain today. She was able to tolerate the ankle stretches far batter than last visit. Introduced 4 way ankle strengthening to provided more stability for achilles. Pt did well and updated HEP with these exercises. Concluded session with [place] to achilles to reduce pain and promote healing.    OBJECTIVE IMPAIRMENTS Abnormal gait, decreased activity tolerance, difficulty walking, decreased ROM, decreased strength, increased fascial restrictions, increased muscle spasms, impaired flexibility, and pain.   ACTIVITY LIMITATIONS stairs and locomotion level  PARTICIPATION LIMITATIONS:  does ADLS with pain  PERSONAL FACTORS Time since onset of injury/illness/exacerbation and 3+ comorbidities: L THA, bil knee pain and depression are also affecting patient's functional outcome.   REHAB POTENTIAL: Excellent  CLINICAL DECISION MAKING: Stable/uncomplicated  EVALUATION COMPLEXITY: Low   GOALS: Goals reviewed with patient? Yes  SHORT TERM GOALS: Target date: 08/17/2022 (Remove Blue Hyperlink)  Patient will be independent with initial HEP. Baseline:  Goal status: MET     LONG TERM GOALS: Target date: 09/14/2022 (Remove Blue Hyperlink)  Patient will be independent with advanced/ongoing HEP to improve outcomes and carryover.  Baseline:  Goal status: IN PROGRESS  2.  Patient will report at least 75% improvement in R ankle pain to improve QOL. Baseline:  Goal status: IN PROGRESS  3.  Patient will demonstrate improved active R ankle DF to  +8 deg or better  to allow for normal gait and stair mechanics. Baseline:  Goal status: IN PROGRESS  4.  Patient will demonstrate improved LE strength to 5/5 to improve function. Baseline:  Goal status: IN PROGRESS  5.  Patient will be able to ascend/descend stairs with 1 HR and reciprocal step pattern safely to access home and community.  Baseline:  Goal status: IN PROGRESS  7.  Patient will report >=54/80 on LEFS   to demonstrate improved functional ability. Baseline: 45/80 Goal status: IN PROGRESS  8.  Patient will be able to walk her dog without difficulty due to R ankle deficits. Baseline:  Goal status: IN PROGRESS    PLAN: PT FREQUENCY: 2x/week  PT DURATION: 6 weeks  PLANNED INTERVENTIONS: Therapeutic exercises, Therapeutic activity, Neuromuscular re-education, Balance training, Gait training, Patient/Family education, Self Care, Joint  mobilization, Joint manipulation, Stair training, Dry Needling, Electrical stimulation, Cryotherapy, Moist heat, Taping, Ultrasound, Ionotophoresis 80m/ml Dexamethasone, and Manual therapy  PLAN FOR NEXT SESSION:progress ROM and strengthening; [place] benefit noted; manual/DN to R gastroc/soleus   BArtist Pais PTA 08/21/2022, 8:49 AM

## 2022-08-24 ENCOUNTER — Encounter: Payer: BC Managed Care – PPO | Admitting: Physical Therapy

## 2022-08-27 ENCOUNTER — Encounter: Payer: BC Managed Care – PPO | Admitting: Physical Therapy

## 2022-08-27 NOTE — Therapy (Signed)
OUTPATIENT PHYSICAL THERAPY TREATMENT   Patient Name: Tiffany Sharp MRN: [id number] DOB:December 01, 1970, 51 y.o., female Today's Date: 08/31/2022   PT End of Session - 08/31/22 0811     Visit Number 4    Number of Visits 12    Date for PT Re-Evaluation 09/14/22    Authorization Type BCBS    PT Start Time 0808    PT Stop Time 0846    PT Time Calculation (min) 38 min    Activity Tolerance Patient tolerated treatment well    Behavior During Therapy [facility] for tasks assessed/performed               Past Medical History:  Diagnosis Date   Abscess    Breast asymmetry in female 10/12/2012   Right breast on December 2013 mammogram and ultrasound Followup ultrasound in June 2014   Breast lump    had biopsy, benign   Breast lump 08/25/2013   R breast, seen at [facility] 09/2012, likely glandular tissue.  Recommended f/u Mg in 6 months (this was prior to pregnancy)   Depression    PPD - post partum depression   Eclampsia    Endometritis    HELLP (hemolytic anemia/elev liver enzymes/low platelets in pregnancy)    History of echocardiogram    Echo 2/17: EF 60-65%, normal wall motion, mild RVE, atrial septal aneurysm (findings consistent with increased left atrial pressure)   History of toxic shock syndrome 08/25/2013   HSV-2 (herpes simplex virus 2) infection    IBS (irritable bowel syndrome)    Knee instability    Murmur    told this as a child - no specifict testing ever done   Post partum depression    Postpartum hemorrhage    Pregnancy induced hypertension    no meds outside of pregnancy   Preterm delivery 09/02/2013   Rubella non-immune status, antepartum 08/25/2013   Toxic shock syndrome ([place]) 1997   started with an abscess right axilla   Past Surgical History:  Procedure Laterality Date   BREAST BIOPSY  2013   benign   DILATION AND CURETTAGE OF UTERUS     DILATION AND EVACUATION N/A 09/01/2013   Procedure: DILATATION AND EVACUATION;  Surgeon: Delice Lesch,  MD;  Location: [facility];  Service: Gynecology;  Laterality: N/A;   INCISE AND DRAIN ABCESS  1996   under left arm   Patient Active Problem List   Diagnosis Date Noted   Seasonal allergies 12/31/2021   Hot flashes 06/16/2021   Pre-op examination 06/16/2021   Memory problem 05/16/2021   Gastroesophageal reflux disease 05/16/2021   Preventative health care 06/02/2016   Osteoarthritis 06/02/2016   Hyperglycemia 06/05/2015   ADHD (attention deficit hyperactivity disorder) 06/03/2015   Overweight 06/03/2015   Thrombocytopenia, unspecified ([place]) 08/28/2013   HSV (herpes simplex virus) infection 08/25/2013   H/O PPH (postpartum hemorrhage) 08/25/2013   Anemia 08/25/2013   Allergy to penicillin 08/25/2013   History of eclampsia 08/25/2013   Advanced maternal age (AMA), 79 years or greater 08/25/2013   Obesity, unspecified 08/25/2013   History of loop electrical excision procedure (LEEP) 08/25/2013   H/O LGA (large for gestational age) fetus 08/25/2013    PCP: Debbrah Alar, NP   REFERRING PROVIDER: Marchia Bond, MD  REFERRING DIAG: M76.60 (ICD-10-CM) - Achilles tendonitis  THERAPY DIAG:  Pain in right ankle and joints of right foot  Stiffness of right ankle, not elsewhere classified  Muscle weakness (generalized)  Muscle spasm of calf  Rationale for Evaluation  and Treatment Rehabilitation  ONSET DATE: 2 years ago  SUBJECTIVE:   SUBJECTIVE STATEMENT: Patient reports that the past few days have been really bad. She stayed off of it all day yesterday and today it feels great. It seems more to the outside than the achilles.   PERTINENT HISTORY: Left THA 2022, bil knee pain, Depression, heart murmur, one seizure post pregnancy  PAIN:  Are you having pain? Yes: NPRS scale: 0/10 Pain location: right achilles tendon Pain description: sore, achy Aggravating factors: descending stairs, walking Relieving factors: ice  PRECAUTIONS: None  WEIGHT BEARING RESTRICTIONS  No  FALLS:  Has patient fallen in last 6 months? No  LIVING ENVIRONMENT: Lives with: lives with their spouse Lives in: House/apartment Stairs: Yes: Internal: 14 steps; on right going up and External: 3 steps; none Has following equipment at home: None  OCCUPATION: sits for work  PLOF: Independent  PATIENT GOALS stop the pain and get on with my life   OBJECTIVE:   DIAGNOSTIC FINDINGS: none  PATIENT SURVEYS:  LEFS 30/80 = 56.3% disability  COGNITION:  Overall cognitive status: Within functional limits for tasks assessed     SENSATION: WFL   MUSCLE LENGTH: OH:KGOV tightness bil Quads: L 107 deg R 102 deg ITB:WNL Piriformis:right mod Hip Flexors: right tight Heelcords:see ROM   POSTURE:  mild left hip IR  PALPATION:  Palpation: TTP at right gastroc/soleus with increased tissue tension; marked tenderness at achilles insertion to calcaneus     LOWER EXTREMITY ROM:  A/P ROM Right eval Left eval  Hip flexion WNL WNL  Hip external rotation WNL WNL  Knee flexion 127 130  Knee extension -5 -3  Ankle dorsiflexion -5/0 -5/5  Ankle plantarflexion 60 63  Ankle inversion WNL WNL  Ankle eversion WNL WNL   (Blank rows = not tested)  LOWER EXTREMITY MMT:  MMT Right eval Left eval Right 08/31/22  Hip flexion 4 4+   Hip extension 4+ 5   Hip abduction 4+ 5   Hip adduction 5 5   Hip internal rotation     Hip external rotation     Knee flexion 4 5   Knee extension 4 4+   Ankle dorsiflexion 5 5 5   Ankle plantarflexion 10 and partial range 20 weakens for last 10 reps 5-7 reps Full range  Ankle inversion 4+ 4+ 5  Ankle eversion 5 4+ 5   (Blank rows = not tested)   GAIT: Distance walked: 60 Assistive device utilized: None Level of assistance: Complete Independence Comments: decreased heel strike R    TODAY'S TREATMENT: 08/31/22 THERAPEUTIC EXERCISE:  to improve flexibility, strength and mobility.  Verbal and tactile cues throughout for  technique Bike L3 x 6 min Gastroc stretch 2x 30 sec R Soleus stretch 2x 30 sec R Bil heel raise with R leg lowering x 8 On step: R single leg raise and lower with 3 sec up/hold/down x  max reps (advised pt to rest 1.5 min in between sets at home)   Manual Therapy:  STM to right gastroc/soleus and peroneals .Skilled palpation and monitoring of soft tissues during DN Trigger Point Dry-Needling  Treatment instructions: Expect mild to moderate muscle soreness. S/S of pneumothorax if dry needled over a lung field, and to seek immediate medical attention should they occur. Patient verbalized understanding of these instructions and education. Patient Consent Given: Yes Education handout provided: Previously provided Muscles treated: R gastroc, soleus and peroneals Electrical stimulation performed: No Parameters: N/A Treatment response/outcome: Twitch Response Elicited and  Palpable Increase in Muscle Length    08/21/22 THERAPEUTIC EXERCISE:  to improve flexibility, strength and mobility.  Verbal and tactile cues throughout for technique Nustep L5x48mn Standing R runner stretch x 30 Standing soleus stretch x 30 at wall 4 way ankle RTB x 10 each direction  Ultrasound: 8 min to achilles 3 Mhz 1.2 intensity  08/17/22 TherEx: Nustep L3x628m Standing R gastroc stretch on step 3x10"  Manual Therapy: STM to achilles insertion, gastroc/soleus  Ultrasound: 8 min to achilles 3 Mhz 1.2 intensity   08/03/22 THERAPEUTIC EXERCISE:  to improve flexibility, strength and mobility.  Verbal and tactile cues throughout for technique  Bil heel raises at tempo 3 sec up/hold/down x  5 reps. Also attempted   single leg heel raise and double up and single down. R HS, quad stretch supine and quad stretch off EOB x 30 sec ea Gastroc and soleus stretch standing x 30 sec ea Seated soleus stretch x 30 sec  Modalities: Ionotophoresis with 1.0 mL 59m50ml Dexamethasone - 4-6 hour patch (58m70mn) to right  achilles tendon (patch #1 of 6)     PATIENT EDUCATION:  Education details:  advised increased rest between sets of heel raises Person educated: Patient Education method: Explanation, Demonstration, Verbal cues, and Handouts Education comprehension: verbalized understanding and returned demonstration   HOME EXERCISE PROGRAM: Access Code: [email or web address] URL: [email or web address] Date: 08/03/2022 Prepared by: JuliAlmyra Freeercises - Seated Ankle Dorsiflexion Stretch  - 2 x daily - 7 x weekly - 1 sets - 2 reps - 30" hold - Gastroc Stretch on Wall  - 2 x daily - 7 x weekly - 1 sets - 2 reps - 30" hold - Soleus Stretch on Wall  - 2 x daily - 7 x weekly - 1 sets - 2 reps - 30" hold - Standing Heel Raise with Chair Support  - 1 x daily - 4 x weekly - 3 sets - 10 reps - Supine Hamstring Stretch with Strap  - 2 x daily - 7 x weekly - 1 sets - 3 reps - 60 sec hold - Prone Quadriceps Stretch with Strap  - 2-3 x daily - 7 x weekly - 1 sets - 3 reps - 60 sec hold - Supine Quadriceps Stretch with Strap on Table  - 2 x daily - 7 x weekly - 1 sets - 3 reps - 30-60 sec hold  ASSESSMENT:  CLINICAL IMPRESSION: Shantell reports improvement overall, however she had increased pain this weekend when she was stepping down on her RLE coming down stairs. She felt pain in her lateral ankle. She continues to have tenderness and tightness in her lateral lower leg and ankle. Good response to DN today with good twitch responses and increased flexibility per pt afterwards. US w[place] deferred due to time today. ShanMalontinues to demonstrate potential for improvement and would benefit from continued skilled therapy to address impairments.      OBJECTIVE IMPAIRMENTS Abnormal gait, decreased activity tolerance, difficulty walking, decreased ROM, decreased strength, increased fascial restrictions, increased muscle spasms, impaired flexibility, and pain.   ACTIVITY LIMITATIONS stairs and locomotion  level  PARTICIPATION LIMITATIONS:  does ADLS with pain  PERSONAL FACTORS Time since onset of injury/illness/exacerbation and 3+ comorbidities: L THA, bil knee pain and depression are also affecting patient's functional outcome.   REHAB POTENTIAL: Excellent  CLINICAL DECISION MAKING: Stable/uncomplicated  EVALUATION COMPLEXITY: Low   GOALS: Goals reviewed with patient? Yes  SHORT TERM GOALS: Target date: 08/17/2022 (Remove Blue Hyperlink)  Patient will be  independent with initial HEP. Baseline:  Goal status: MET     LONG TERM GOALS: Target date: 09/14/2022 (Remove Blue Hyperlink)  Patient will be independent with advanced/ongoing HEP to improve outcomes and carryover.  Baseline:  Goal status: IN PROGRESS  2.  Patient will report at least 75% improvement in R ankle pain to improve QOL. Baseline:  Goal status: IN PROGRESS  3.  Patient will demonstrate improved active R ankle DF to  +8 deg or better  to allow for normal gait and stair mechanics. Baseline:  Goal status: IN PROGRESS  4.  Patient will demonstrate improved LE strength to 5/5 to improve function. Baseline:  Goal status: IN PROGRESS  5.  Patient will be able to ascend/descend stairs with 1 HR and reciprocal step pattern safely to access home and community.  Baseline:  Goal status: IN PROGRESS  7.  Patient will report >=54/80 on LEFS  to demonstrate improved functional ability. Baseline: 45/80 Goal status: IN PROGRESS  8.  Patient will be able to walk her dog without difficulty due to R ankle deficits. Baseline:  Goal status: IN PROGRESS    PLAN: PT FREQUENCY: 2x/week  PT DURATION: 6 weeks  PLANNED INTERVENTIONS: Therapeutic exercises, Therapeutic activity, Neuromuscular re-education, Balance training, Gait training, Patient/Family education, Self Care, Joint mobilization, Joint manipulation, Stair training, Dry Needling, Electrical stimulation, Cryotherapy, Moist heat, Taping, Ultrasound,  Ionotophoresis 73m/ml Dexamethasone, and Manual therapy  PLAN FOR NEXT SESSION: check ROM,  progress ROM and strengthening; [place] benefit noted; manual/DN to R gastroc/soleus   Jalyn Rosero, PT 08/31/2022, 12:28 PM

## 2022-08-31 ENCOUNTER — Encounter: Payer: Self-pay | Admitting: Physical Therapy

## 2022-08-31 ENCOUNTER — Ambulatory Visit: Payer: BC Managed Care – PPO | Admitting: Physical Therapy

## 2022-08-31 DIAGNOSIS — M25671 Stiffness of right ankle, not elsewhere classified: Secondary | ICD-10-CM | POA: Diagnosis not present

## 2022-08-31 DIAGNOSIS — M25571 Pain in right ankle and joints of right foot: Secondary | ICD-10-CM

## 2022-08-31 DIAGNOSIS — M6281 Muscle weakness (generalized): Secondary | ICD-10-CM

## 2022-08-31 DIAGNOSIS — M62831 Muscle spasm of calf: Secondary | ICD-10-CM

## 2022-09-07 ENCOUNTER — Encounter: Payer: Self-pay | Admitting: Physical Therapy

## 2022-09-07 ENCOUNTER — Ambulatory Visit: Payer: BC Managed Care – PPO | Attending: Orthopedic Surgery | Admitting: Physical Therapy

## 2022-09-07 DIAGNOSIS — M6281 Muscle weakness (generalized): Secondary | ICD-10-CM | POA: Insufficient documentation

## 2022-09-07 DIAGNOSIS — M62831 Muscle spasm of calf: Secondary | ICD-10-CM | POA: Insufficient documentation

## 2022-09-07 DIAGNOSIS — M25571 Pain in right ankle and joints of right foot: Secondary | ICD-10-CM | POA: Insufficient documentation

## 2022-09-07 DIAGNOSIS — M25671 Stiffness of right ankle, not elsewhere classified: Secondary | ICD-10-CM | POA: Insufficient documentation

## 2022-09-07 NOTE — Therapy (Signed)
OUTPATIENT PHYSICAL THERAPY TREATMENT   Patient Name: Tiffany Sharp MRN: [id number] DOB:[birth year], 51 y.o., female Today's Date: 09/07/2022   PT End of Session - 09/07/22 0809     Visit Number 5    Number of Visits 12    Date for PT Re-Evaluation 09/14/22    Authorization Type BCBS    PT Start Time 0807    PT Stop Time 0845    PT Time Calculation (min) 38 min    Activity Tolerance Patient tolerated treatment well    Behavior During Therapy [facility] for tasks assessed/performed               Past Medical History:  Diagnosis Date   Abscess    Breast asymmetry in female 10/12/2012   Right breast on December 2013 mammogram and ultrasound Followup ultrasound in June 2014   Breast lump    had biopsy, benign   Breast lump 08/25/2013   R breast, seen at [facility] 09/2012, likely glandular tissue.  Recommended f/u Mg in 6 months (this was prior to pregnancy)   Depression    PPD - post partum depression   Eclampsia    Endometritis    HELLP (hemolytic anemia/elev liver enzymes/low platelets in pregnancy)    History of echocardiogram    Echo 2/17: EF 60-65%, normal wall motion, mild RVE, atrial septal aneurysm (findings consistent with increased left atrial pressure)   History of toxic shock syndrome 08/25/2013   HSV-2 (herpes simplex virus 2) infection    IBS (irritable bowel syndrome)    Knee instability    Murmur    told this as a child - no specifict testing ever done   Post partum depression    Postpartum hemorrhage    Pregnancy induced hypertension    no meds outside of pregnancy   Preterm delivery 09/02/2013   Rubella non-immune status, antepartum 08/25/2013   Toxic shock syndrome ([place]) 1997   started with an abscess right axilla   Past Surgical History:  Procedure Laterality Date   BREAST BIOPSY  2013   benign   DILATION AND CURETTAGE OF UTERUS     DILATION AND EVACUATION N/A 09/01/2013   Procedure: DILATATION AND EVACUATION;  Surgeon: Delice Lesch,  MD;  Location: [facility];  Service: Gynecology;  Laterality: N/A;   INCISE AND DRAIN ABCESS  1996   under left arm   Patient Active Problem List   Diagnosis Date Noted   Seasonal allergies 12/31/2021   Hot flashes 06/16/2021   Pre-op examination 06/16/2021   Memory problem 05/16/2021   Gastroesophageal reflux disease 05/16/2021   Preventative health care 06/02/2016   Osteoarthritis 06/02/2016   Hyperglycemia 06/05/2015   ADHD (attention deficit hyperactivity disorder) 06/03/2015   Overweight 06/03/2015   Thrombocytopenia, unspecified ([place]) 08/28/2013   HSV (herpes simplex virus) infection 08/25/2013   H/O PPH (postpartum hemorrhage) 08/25/2013   Anemia 08/25/2013   Allergy to penicillin 08/25/2013   History of eclampsia 08/25/2013   Advanced maternal age (AMA), 36 years or greater 08/25/2013   Obesity, unspecified 08/25/2013   History of loop electrical excision procedure (LEEP) 08/25/2013   H/O LGA (large for gestational age) fetus 08/25/2013    PCP: Debbrah Alar, NP   REFERRING PROVIDER: Marchia Bond, MD  REFERRING DIAG: M76.60 (ICD-10-CM) - Achilles tendonitis  THERAPY DIAG:  Pain in right ankle and joints of right foot  Stiffness of right ankle, not elsewhere classified  Muscle weakness (generalized)  Muscle spasm of calf  Rationale for Evaluation  and Treatment Rehabilitation  ONSET DATE: 2 years ago  SUBJECTIVE:   SUBJECTIVE STATEMENT: Patient reports that the past few days have been really bad. Saturday was like an 8-9.  Feels like its more the side of her ankle than achilles.  The dry needling and [place] helped. Started doing the ankle exercises but stopped because it got so inflamed.  Seems like its gotten worse since started therapy.    PERTINENT HISTORY: Left THA 2022, bil knee pain, Depression, heart murmur, one seizure post pregnancy  PAIN:  Are you having pain? Yes: NPRS scale: 4/10 Pain location: right lateral ankle Pain description: sore,  achy Aggravating factors: descending stairs, walking Relieving factors: ice  PRECAUTIONS: None  WEIGHT BEARING RESTRICTIONS No  FALLS:  Has patient fallen in last 6 months? No  LIVING ENVIRONMENT: Lives with: lives with their spouse Lives in: House/apartment Stairs: Yes: Internal: 14 steps; on right going up and External: 3 steps; none Has following equipment at home: None  OCCUPATION: sits for work  PLOF: Independent  PATIENT GOALS stop the pain and get on with my life   OBJECTIVE:   DIAGNOSTIC FINDINGS: none  PATIENT SURVEYS:  LEFS 30/80 = 56.3% disability  COGNITION:  Overall cognitive status: Within functional limits for tasks assessed     SENSATION: WFL   MUSCLE LENGTH: JZ:PHXT tightness bil Quads: L 107 deg R 102 deg ITB:WNL Piriformis:right mod Hip Flexors: right tight Heelcords:see ROM   POSTURE:  mild left hip IR  PALPATION:  Palpation: TTP at right gastroc/soleus with increased tissue tension; marked tenderness at achilles insertion to calcaneus     LOWER EXTREMITY ROM:  A/P ROM Right eval Left eval  Hip flexion WNL WNL  Hip external rotation WNL WNL  Knee flexion 127 130  Knee extension -5 -3  Ankle dorsiflexion -5/0 -5/5  Ankle plantarflexion 60 63  Ankle inversion WNL WNL  Ankle eversion WNL WNL   (Blank rows = not tested)  LOWER EXTREMITY MMT:  MMT Right eval Left eval Right 08/31/22  Hip flexion 4 4+   Hip extension 4+ 5   Hip abduction 4+ 5   Hip adduction 5 5   Hip internal rotation     Hip external rotation     Knee flexion 4 5   Knee extension 4 4+   Ankle dorsiflexion _0 Ankle plantarflexion 10 and partial range 20 weakens for last 10 reps 5-7 reps Full range  Ankle inversion 4+ 4+ 5  Ankle eversion 5 4+ 5   (Blank rows = not tested)   GAIT: Distance walked: 60 Assistive device utilized: None Level of assistance: Complete Independence Comments: decreased heel strike R    TODAY'S  TREATMENT: 09/07/2022 Therapeutic Exercise: to improve strength and mobility.  Demo, verbal and tactile cues throughout for technique. Bike L2 x 5 min Toe raises at wall 2 x 10  Single leg RDLs 2 x 10 bil - support from chair for safety Bridges x 10, with GTB x 10 Minisquats with GTB x 10 - very nervous about provoking knee pain.  Manual Therapy: to decrease muscle spasm and pain and improve mobility STM/TPR to R gastroc/soleus, skilled palpation and monitoring during dry needling. Trigger Point Dry-Needling  Treatment instructions: Expect mild to moderate muscle soreness. S/S of pneumothorax if dry needled over a lung field, and to seek immediate medical attention should they occur. Patient verbalized understanding of these instructions and education. Patient Consent Given: Yes Education handout provided: Previously provided Muscles  treated: R gastroc, soleus and peroneals Electrical stimulation performed: No Parameters: N/A Treatment response/outcome: Twitch Response Elicited and Palpable Increase in Muscle Length    08/31/22 THERAPEUTIC EXERCISE:  to improve flexibility, strength and mobility.  Verbal and tactile cues throughout for technique Bike L3 x 6 min Gastroc stretch 2x 30 sec R Soleus stretch 2x 30 sec R Bil heel raise with R leg lowering x 8 On step: R single leg raise and lower with 3 sec up/hold/down x  max reps (advised pt to rest 1.5 min in between sets at home)   Manual Therapy:  STM to right gastroc/soleus and peroneals .Skilled palpation and monitoring of soft tissues during DN Trigger Point Dry-Needling  Treatment instructions: Expect mild to moderate muscle soreness. S/S of pneumothorax if dry needled over a lung field, and to seek immediate medical attention should they occur. Patient verbalized understanding of these instructions and education. Patient Consent Given: Yes Education handout provided: Previously provided Muscles treated: R gastroc, soleus and  peroneals Electrical stimulation performed: No Parameters: N/A Treatment response/outcome: Twitch Response Elicited and Palpable Increase in Muscle Length    08/21/22 THERAPEUTIC EXERCISE:  to improve flexibility, strength and mobility.  Verbal and tactile cues throughout for technique Nustep L5x99mn Standing R runner stretch x 30 Standing soleus stretch x 30 at wall 4 way ankle RTB x 10 each direction  Ultrasound: 8 min to achilles 3 Mhz 1.2 intensity  08/17/22 TherEx: Nustep L3x6416m Standing R gastroc stretch on step 3x10"  Manual Therapy: STM to achilles insertion, gastroc/soleus  Ultrasound: 8 min to achilles 3 Mhz 1.2 intensity   08/03/22 THERAPEUTIC EXERCISE:  to improve flexibility, strength and mobility.  Verbal and tactile cues throughout for technique  Bil heel raises at tempo 3 sec up/hold/down x  5 reps. Also attempted   single leg heel raise and double up and single down. R HS, quad stretch supine and quad stretch off EOB x 30 sec ea Gastroc and soleus stretch standing x 30 sec ea Seated soleus stretch x 30 sec  Modalities: Ionotophoresis with 1.0 mL 16m57ml Dexamethasone - 4-6 hour patch (60m61mn) to right achilles tendon (patch #1 of 6)     PATIENT EDUCATION:  Education details:  HEP progression 09/07/22 for hip strengthening, ice massage Person educated: Patient Education method: Explanation, Demonstration, Verbal cues, and Handouts Education comprehension: verbalized understanding and returned demonstration   HOME EXERCISE PROGRAM: Access Code: [email or web address] URL: [email or web address] Date: 09/07/2022 Prepared by: ElizGlenetta Hewercises Added - Toe Raise With Back Against Wall  - 1 x daily - 7 x weekly - 3 sets - 10 reps - Forward T with Counter Support  - 1 x daily - 7 x weekly - 3 sets - 10 reps - Supine Bridge with Resistance Band  - 1 x daily - 7 x weekly - 3 sets - 10 reps  Patient Education - Ice  Massage  ASSESSMENT:  CLINICAL IMPRESSION: ShanINSIYA OSHEAorts inconsistent pain and inflammation still in R ankle, reporting more lateral pain over lateral malleolus, but only tender at achilles insertion on calcaneous with palpation.  She is using gel heel cups and has custom orthotics, but these are 2 ye33rs old.  She reports that she has not done hip strengthening exercises since recovery from R THA.  Today focused on proximal hip strengthening.  Trialed squats with GTB to activate glutes, but very nervous due to history of knee pain, so not added to HEP.  Tolerated manual therapy well.  Also  instructed in ice massage.  Felicite continues to demonstrate potential for improvement and would benefit from continued skilled therapy to address impairments.      OBJECTIVE IMPAIRMENTS Abnormal gait, decreased activity tolerance, difficulty walking, decreased ROM, decreased strength, increased fascial restrictions, increased muscle spasms, impaired flexibility, and pain.   ACTIVITY LIMITATIONS stairs and locomotion level  PARTICIPATION LIMITATIONS:  does ADLS with pain  PERSONAL FACTORS Time since onset of injury/illness/exacerbation and 3+ comorbidities: L THA, bil knee pain and depression are also affecting patient's functional outcome.   REHAB POTENTIAL: Excellent  CLINICAL DECISION MAKING: Stable/uncomplicated  EVALUATION COMPLEXITY: Low   GOALS: Goals reviewed with patient? Yes  SHORT TERM GOALS: Target date: 08/17/2022 (Remove Blue Hyperlink)  Patient will be independent with initial HEP. Baseline:  Goal status: MET     LONG TERM GOALS: Target date: 09/14/2022 (Remove Blue Hyperlink)  Patient will be independent with advanced/ongoing HEP to improve outcomes and carryover.  Baseline:  Goal status: IN PROGRESS  2.  Patient will report at least 75% improvement in R ankle pain to improve QOL. Baseline:  Goal status: IN PROGRESS  3.  Patient will demonstrate improved  active R ankle DF to  +8 deg or better  to allow for normal gait and stair mechanics. Baseline:  Goal status: IN PROGRESS  4.  Patient will demonstrate improved LE strength to 5/5 to improve function. Baseline:  Goal status: IN PROGRESS  5.  Patient will be able to ascend/descend stairs with 1 HR and reciprocal step pattern safely to access home and community.  Baseline:  Goal status: IN PROGRESS  7.  Patient will report >=54/80 on LEFS  to demonstrate improved functional ability. Baseline: 45/80 Goal status: IN PROGRESS  8.  Patient will be able to walk her dog without difficulty due to R ankle deficits. Baseline:  Goal status: IN PROGRESS    PLAN: PT FREQUENCY: 2x/week  PT DURATION: 6 weeks  PLANNED INTERVENTIONS: Therapeutic exercises, Therapeutic activity, Neuromuscular re-education, Balance training, Gait training, Patient/Family education, Self Care, Joint mobilization, Joint manipulation, Stair training, Dry Needling, Electrical stimulation, Cryotherapy, Moist heat, Taping, Ultrasound, Ionotophoresis 83m/ml Dexamethasone, and Manual therapy  PLAN FOR NEXT SESSION: check ROM,  progress ROM and strengthening; [place] noted; manual/DN to R gastroc/soleus   ERennie Natter PT, DPT 09/07/2022, 11:20 AM

## 2022-09-14 ENCOUNTER — Telehealth: Payer: Self-pay | Admitting: Family

## 2022-09-14 ENCOUNTER — Encounter: Payer: Self-pay | Admitting: Physical Therapy

## 2022-09-14 ENCOUNTER — Ambulatory Visit: Payer: BC Managed Care – PPO | Admitting: Physical Therapy

## 2022-09-14 DIAGNOSIS — M6281 Muscle weakness (generalized): Secondary | ICD-10-CM

## 2022-09-14 DIAGNOSIS — M25671 Stiffness of right ankle, not elsewhere classified: Secondary | ICD-10-CM

## 2022-09-14 DIAGNOSIS — M62831 Muscle spasm of calf: Secondary | ICD-10-CM | POA: Diagnosis not present

## 2022-09-14 DIAGNOSIS — M25579 Pain in unspecified ankle and joints of unspecified foot: Secondary | ICD-10-CM

## 2022-09-14 DIAGNOSIS — M25571 Pain in right ankle and joints of right foot: Secondary | ICD-10-CM | POA: Diagnosis not present

## 2022-09-14 NOTE — Therapy (Signed)
OUTPATIENT PHYSICAL THERAPY TREATMENT   Patient Name: Tiffany Sharp MRN: [id number] DOB:1971/01/22, 51 y.o., female Today's Date: 09/14/2022   PT End of Session - 09/14/22 0850     Visit Number 6    Number of Visits 12    Date for PT Re-Evaluation 09/14/22    Authorization Type BC[employer]    PT Start Time [phone]    PT Stop Time 0933    PT Time Calculation (min) 42 min    Activity Tolerance Patient tolerated treatment well    Behavior During Therapy [facility] for tasks assessed/performed               Past Medical History:  Diagnosis Date   Abscess    Breast asymmetry in female 10/12/2012   Right breast on December 2013 mammogram and ultrasound Followup ultrasound in June 2014   Breast lump    had biopsy, benign   Breast lump 08/25/2013   R breast, seen at [facility] 09/2012, likely glandular tissue.  Recommended f/u Mg in 6 months (this was prior to pregnancy)   Depression    PPD - post partum depression   Eclampsia    Endometritis    HELLP (hemolytic anemia/elev liver enzymes/low platelets in pregnancy)    History of echocardiogram    Echo 2/17: EF 60-65%, normal wall motion, mild RVE, atrial septal aneurysm (findings consistent with increased left atrial pressure)   History of toxic shock syndrome 08/25/2013   HSV-2 (herpes simplex virus 2) infection    IBS (irritable bowel syndrome)    Knee instability    Murmur    told this as a child - no specifict testing ever done   Post partum depression    Postpartum hemorrhage    Pregnancy induced hypertension    no meds outside of pregnancy   Preterm delivery 09/02/2013   Rubella non-immune status, antepartum 08/25/2013   Toxic shock syndrome ([place]) 1997   started with an abscess right axilla   Past Surgical History:  Procedure Laterality Date   BREAST BIOPSY  2013   benign   DILATION AND CURETTAGE OF UTERUS     DILATION AND EVACUATION N/A 09/01/2013   Procedure: DILATATION AND EVACUATION;  Surgeon: Delice Lesch,  MD;  Location: [facility];  Service: Gynecology;  Laterality: N/A;   INCISE AND DRAIN ABCESS  1996   under left arm   Patient Active Problem List   Diagnosis Date Noted   Seasonal allergies 12/31/2021   Hot flashes 06/16/2021   Pre-op examination 06/16/2021   Memory problem 05/16/2021   Gastroesophageal reflux disease 05/16/2021   Preventative health care 06/02/2016   Osteoarthritis 06/02/2016   Hyperglycemia 06/05/2015   ADHD (attention deficit hyperactivity disorder) 06/03/2015   Overweight 06/03/2015   Thrombocytopenia, unspecified ([place]) 08/28/2013   HSV (herpes simplex virus) infection 08/25/2013   H/O PPH (postpartum hemorrhage) 08/25/2013   Anemia 08/25/2013   Allergy to penicillin 08/25/2013   History of eclampsia 08/25/2013   Advanced maternal age (AMA), 16 years or greater 08/25/2013   Obesity, unspecified 08/25/2013   History of loop electrical excision procedure (LEEP) 08/25/2013   H/O LGA (large for gestational age) fetus 08/25/2013    PCP: Debbrah Alar, NP   REFERRING PROVIDER: Marchia Bond, MD  REFERRING DIAG: M76.60 (ICD-10-CM) - Achilles tendonitis  THERAPY DIAG:  Pain in right ankle and joints of right foot  Stiffness of right ankle, not elsewhere classified  Muscle weakness (generalized)  Muscle spasm of calf  Rationale for Evaluation  and Treatment Rehabilitation  ONSET DATE: 2 years ago  SUBJECTIVE:   SUBJECTIVE STATEMENT: Patient reports that the pain seems to be spreading.  More on the R outside of her R ankle.    She is frustrated.  Reported more consistent with exercises.   The ice massage is helping.   PERTINENT HISTORY: Left THA 2022, bil knee pain, Depression, heart murmur, one seizure post pregnancy  PAIN:  Are you having pain? Yes: NPRS scale: 3-4/10 Pain location: right lateral ankle Pain description: sore, achy Aggravating factors: descending stairs, walking Relieving factors: ice  PRECAUTIONS: None  WEIGHT BEARING  RESTRICTIONS No  FALLS:  Has patient fallen in last 6 months? No  LIVING ENVIRONMENT: Lives with: lives with their spouse Lives in: House/apartment Stairs: Yes: Internal: 14 steps; on right going up and External: 3 steps; none Has following equipment at home: None  OCCUPATION: sits for work  PLOF: Independent  PATIENT GOALS stop the pain and get on with my life   OBJECTIVE:   DIAGNOSTIC FINDINGS: none  PATIENT SURVEYS:  LEFS 30/80 = 56.3% disability  COGNITION:  Overall cognitive status: Within functional limits for tasks assessed     SENSATION: WFL   MUSCLE LENGTH: JK:DTOI tightness bil Quads: L 107 deg R 102 deg ITB:WNL Piriformis:right mod Hip Flexors: right tight Heelcords:see ROM   POSTURE:  mild left hip IR  PALPATION:  Palpation: TTP at right gastroc/soleus with increased tissue tension; marked tenderness at achilles insertion to calcaneus     LOWER EXTREMITY ROM:  A/P ROM Right eval Left eval  Hip flexion WNL WNL  Hip external rotation WNL WNL  Knee flexion 127 130  Knee extension -5 -3  Ankle dorsiflexion -5/0 -5/5  Ankle plantarflexion 60 63  Ankle inversion WNL WNL  Ankle eversion WNL WNL   (Blank rows = not tested)  LOWER EXTREMITY MMT:  MMT Right eval Left eval Right 08/31/22  Hip flexion 4 4+   Hip extension 4+ 5   Hip abduction 4+ 5   Hip adduction 5 5   Hip internal rotation     Hip external rotation     Knee flexion 4 5   Knee extension 4 4+   Ankle dorsiflexion _0 Ankle plantarflexion 10 and partial range 20 weakens for last 10 reps 5-7 reps Full range  Ankle inversion 4+ 4+ 5  Ankle eversion 5 4+ 5   (Blank rows = not tested)   GAIT: Distance walked: 60 Assistive device utilized: None Level of assistance: Complete Independence Comments: decreased heel strike R    TODAY'S TREATMENT: 09/14/2022 Therapeutic Exercise: to improve strength and mobility.  Demo, verbal and tactile cues throughout for  technique. Bike L2 x 9 min - while dicussing concerns.  Yoga toes- extending big toe only x 10, extending little toes only x 10, spreading toes x 10 Marble pick-ups for intrinsic muscle strengthening Towel crunches x 10 Self Care: Education on orthotics and recommendations** Manual Therapy: to decrease muscle spasm and pain and improve mobility   IASTM with s/s tools to achilles insertion, MFR to lateral ankle  09/07/2022 Therapeutic Exercise: to improve strength and mobility.  Demo, verbal and tactile cues throughout for technique. Bike L2 x 5 min Toe raises at wall 2 x 10  Single leg RDLs 2 x 10 bil - support from chair for safety Bridges x 10, with GTB x 10 Minisquats with GTB x 10 - very nervous about provoking knee pain.  Manual Therapy: to  decrease muscle spasm and pain and improve mobility STM/TPR to R gastroc/soleus, skilled palpation and monitoring during dry needling. Trigger Point Dry-Needling  Treatment instructions: Expect mild to moderate muscle soreness. S/S of pneumothorax if dry needled over a lung field, and to seek immediate medical attention should they occur. Patient verbalized understanding of these instructions and education. Patient Consent Given: Yes Education handout provided: Previously provided Muscles treated: R gastroc, soleus and peroneals Electrical stimulation performed: No Parameters: N/A Treatment response/outcome: Twitch Response Elicited and Palpable Increase in Muscle Length    08/31/22 THERAPEUTIC EXERCISE:  to improve flexibility, strength and mobility.  Verbal and tactile cues throughout for technique Bike L3 x 6 min Gastroc stretch 2x 30 sec R Soleus stretch 2x 30 sec R Bil heel raise with R leg lowering x 8 On step: R single leg raise and lower with 3 sec up/hold/down x  max reps (advised pt to rest 1.5 min in between sets at home)   Manual Therapy:  STM to right gastroc/soleus and peroneals Skilled palpation and monitoring of soft  tissues during DN Trigger Point Dry-Needling  Treatment instructions: Expect mild to moderate muscle soreness. S/S of pneumothorax if dry needled over a lung field, and to seek immediate medical attention should they occur. Patient verbalized understanding of these instructions and education. Patient Consent Given: Yes Education handout provided: Previously provided Muscles treated: R gastroc, soleus and peroneals Electrical stimulation performed: No Parameters: N/A Treatment response/outcome: Twitch Response Elicited and Palpable Increase in Muscle Length      PATIENT EDUCATION:  Education details:  HEP progression 11/13 for intrinsic foot strengthening Person educated: Patient Education method: Explanation, Demonstration, Verbal cues, and Handouts Education comprehension: verbalized understanding and returned demonstration   HOME EXERCISE PROGRAM: Access Code: [email or web address] URL: [email or web address] Date: 09/14/2022 Prepared by: Glenetta Hew  Exercises added - Toe Yoga - Alternating Great Toe and Lesser Toe Extension  - 1 x daily - 7 x weekly - 2 sets - 10 reps - Towel Scrunches  - 1 x daily - 7 x weekly - 2 sets - 10 reps - Seated Marble Pick-Up with Toes  - 1 x daily - 7 x weekly - 2 sets - 10 reps   ASSESSMENT:  CLINICAL IMPRESSION: Tiffany Sharp reports she continues to have lateral ankle pain, especially with weightbearing, "dreads putting on shoes".  She reports intermittent sharp shooting pain, suspect a tarsal entrapment syndrome may also be causing pain.   Today we focused on intrinsic muscle strengthening of her right foot followed by manual therapy to decrease neural tension in this area, tolerate well.  We also had a discussion about her orthotics and shoe choices, will contact her PCP for referral for customizable orthotics made by a physical therapist to correct her foot rather than just supporting her arch.  Tiffany Sharp continues to demonstrate  potential for improvement and would benefit from continued skilled therapy to address impairments.  Extending her plan of care additional 4 weeks since she has not been able to consistently come 2 times a week and she has not reached full potential.   OBJECTIVE IMPAIRMENTS Abnormal gait, decreased activity tolerance, difficulty walking, decreased ROM, decreased strength, increased fascial restrictions, increased muscle spasms, impaired flexibility, and pain.   ACTIVITY LIMITATIONS stairs and locomotion level  PARTICIPATION LIMITATIONS:  does ADLS with pain  PERSONAL FACTORS Time since onset of injury/illness/exacerbation and 3+ comorbidities: L THA, bil knee pain and depression are also affecting patient's functional outcome.   REHAB POTENTIAL: Excellent  CLINICAL DECISION MAKING: Stable/uncomplicated  EVALUATION COMPLEXITY: Low   GOALS: Goals reviewed with patient? Yes  SHORT TERM GOALS: Target date: 08/17/2022    1. Patient will be independent with initial HEP. Baseline:  Goal status: MET    LONG TERM GOALS: Target date: 09/14/2022 extended to 10/12/2022   Patient will be independent with advanced/ongoing HEP to improve outcomes and carryover.  Baseline:  Goal status: IN PROGRESS  2.  Patient will report at least 75% improvement in R ankle pain to improve QOL. Baseline:  Goal status: IN PROGRESS  3.  Patient will demonstrate improved active R ankle DF to  +8 deg or better  to allow for normal gait and stair mechanics. Baseline:  Goal status: IN PROGRESS  4.  Patient will demonstrate improved LE strength to 5/5 to improve function. Baseline:  Goal status: IN PROGRESS  5.  Patient will be able to ascend/descend stairs with 1 HR and reciprocal step pattern safely to access home and community.  Baseline:  Goal status: IN PROGRESS  7.  Patient will report >=54/80 on LEFS  to demonstrate improved functional ability. Baseline: 45/80 Goal status: IN PROGRESS  8.   Patient will be able to walk her dog without difficulty due to R ankle deficits. Baseline:  Goal status: IN PROGRESS    PLAN: PT FREQUENCY: 2x/week  PT DURATION: 6 weeks extended 4 more weeks to 10/12/2022  PLANNED INTERVENTIONS: Therapeutic exercises, Therapeutic activity, Neuromuscular re-education, Balance training, Gait training, Patient/Family education, Self Care, Joint mobilization, Joint manipulation, Stair training, Dry Needling, Electrical stimulation, Cryotherapy, Moist heat, Taping, Ultrasound, Ionotophoresis 19m/ml Dexamethasone, and Manual therapy  PLAN FOR NEXT SESSION: check ROM,  progress ROM and strengthening; [place] benefit noted; manual/DN to R gastroc/soleus   ERennie Natter PT, DPT 09/14/2022, 11:29 AM

## 2022-09-14 NOTE — Telephone Encounter (Signed)
-----   Message from Debbrah Alar, NP sent at 09/14/2022 11:49 AM EST ----- Regarding: RE: referral for orthotics Will do. Tks ----- Message ----- From: Rennie Natter, PT Sent: 09/14/2022  11:17 AM EST To: Debbrah Alar, NP Subject: referral for orthotics                         I am seeing Gertie Fey for R ankle pain.  She has very old orthotics and would benefit from new ones.  Would you be willing to send a referral for PT to the [facility] on [address], [place] for orthotics with Dellis Filbert, PT?  He makes the best (and most affordable) orthotics I've seen.   The fax number is [phone].  Thank you for your help, Sharlene Motts, PT

## 2022-09-23 ENCOUNTER — Encounter: Payer: Self-pay | Admitting: Physical Therapy

## 2022-09-23 ENCOUNTER — Ambulatory Visit: Payer: BC Managed Care – PPO | Admitting: Physical Therapy

## 2022-09-23 DIAGNOSIS — M6281 Muscle weakness (generalized): Secondary | ICD-10-CM | POA: Diagnosis not present

## 2022-09-23 DIAGNOSIS — M62831 Muscle spasm of calf: Secondary | ICD-10-CM

## 2022-09-23 DIAGNOSIS — M25671 Stiffness of right ankle, not elsewhere classified: Secondary | ICD-10-CM | POA: Diagnosis not present

## 2022-09-23 DIAGNOSIS — M25571 Pain in right ankle and joints of right foot: Secondary | ICD-10-CM

## 2022-09-23 NOTE — Therapy (Addendum)
PHYSICAL THERAPY DISCHARGE SUMMARY  Visits from Start of Care: 7  Current functional level related to goals / functional outcomes: No improvement   Remaining deficits: Continued severe R ankle pain   Education / Equipment: HEP  Plan: Patient agrees to discharge.  Patient goals were not met. Patient was referred to Dr. Schmitz who requested to stop therapy and placed in CAM boot due to severity of Achilles tendonitis.  She will request new order when/if more therapy is needed.     J , PT, DPT     OUTPATIENT PHYSICAL THERAPY TREATMENT   Patient Name: Tiffany Sharp MRN: [id number] DOB:04/26/1971, 51 y.o., female Today's Date: 09/23/2022   PT End of Session - 09/23/22 1022     Visit Number 7    Number of Visits 12    Date for PT Re-Evaluation 10/12/22    Authorization Type BCBS    PT Start Time 1019    PT Stop Time 1105    PT Time Calculation (min) 46 min    Activity Tolerance Patient tolerated treatment well    Behavior During Therapy WFL for tasks assessed/performed               Past Medical History:  Diagnosis Date   Abscess    Breast asymmetry in female 10/12/2012   Right breast on December 2013 mammogram and ultrasound Followup ultrasound in June 2014   Breast lump    had biopsy, benign   Breast lump 08/25/2013   [facility], likely glandular tissue.  Recommended f/u Mg in 6 months (this was prior to pregnancy)   Depression    PPD - post partum depression   Eclampsia    Endometritis    HELLP (hemolytic anemia/elev liver enzymes/low platelets in pregnancy)    History of echocardiogram    Echo 2/17: EF 60-65%, normal wall motion, mild RVE, atrial septal aneurysm (findings consistent with increased left atrial pressure)   History of toxic shock syndrome 08/25/2013   HSV-2 (herpes simplex virus 2) infection    IBS (irritable bowel syndrome)    Knee instability    Murmur    told this as a child - no specifict  testing ever done   Post partum depression    Postpartum hemorrhage    Pregnancy induced hypertension    no meds outside of pregnancy   Preterm delivery 09/02/2013   Rubella non-immune status, antepartum 08/25/2013   Toxic shock syndrome (HCC) 1997   started with an abscess right axilla   Past Surgical History:  Procedure Laterality Date   BREAST BIOPSY  2013   benign   DILATION AND CURETTAGE OF UTERUS     DILATION AND EVACUATION N/A 09/01/2013   Procedure: DILATATION AND EVACUATION;  Surgeon: Angela Y Roberts, MD;  Location: [facility];  Service: Gynecology;  Laterality: N/A;   INCISE AND DRAIN ABCESS  1996   under left arm   Patient Active Problem List   Diagnosis Date Noted   Seasonal allergies 12/31/2021   Hot flashes 06/16/2021   Pre-op examination 06/16/2021   Memory problem 05/16/2021   Gastroesophageal reflux disease 05/16/2021   Preventative health care 06/02/2016   Osteoarthritis 06/02/2016   Hyperglycemia 06/05/2015   ADHD (attention deficit hyperactivity disorder) 06/03/2015   Overweight 06/03/2015   Thrombocytopenia, unspecified (HCC) 08/28/2013   HSV (herpes simplex virus) infection 08/25/2013   H/O PPH (postpartum hemorrhage) 08/25/2013   Anemia 08/25/2013   Allergy to penicillin 08/25/2013   History   of eclampsia 08/25/2013   Advanced maternal age (AMA), 32 years or greater 08/25/2013   Obesity, unspecified 08/25/2013   History of loop electrical excision procedure (LEEP) 08/25/2013   H/O LGA (large for gestational age) fetus 08/25/2013    PCP: Debbrah Alar, NP   REFERRING PROVIDER: Marchia Bond, MD  REFERRING DIAG: M76.60 (ICD-10-CM) - Achilles tendonitis  THERAPY DIAG:  Pain in right ankle and joints of right foot  Stiffness of right ankle, not elsewhere classified  Muscle weakness (generalized)  Muscle spasm of calf  Rationale for Evaluation and Treatment Rehabilitation  ONSET DATE: 2 years ago  SUBJECTIVE:   SUBJECTIVE  STATEMENT: Patient reports she logged 26K steps yesterday, already has 10K steps this morning, cooking for holiday.  Pain is awful and she is very frustrated.    PERTINENT HISTORY: Left THA 2022, bil knee pain, Depression, heart murmur, one seizure post pregnancy  PAIN:  Are you having pain? Yes: NPRS scale: 8/10 Pain location: right lateral ankle Pain description: sore, achy Aggravating factors: descending stairs, walking Relieving factors: ice  PRECAUTIONS: None  WEIGHT BEARING RESTRICTIONS No  FALLS:  Has patient fallen in last 6 months? No  LIVING ENVIRONMENT: Lives with: lives with their spouse Lives in: House/apartment Stairs: Yes: Internal: 14 steps; on right going up and External: 3 steps; none Has following equipment at home: None  OCCUPATION: sits for work  PLOF: Independent  PATIENT GOALS stop the pain and get on with my life   OBJECTIVE:   DIAGNOSTIC FINDINGS: none  PATIENT SURVEYS:  LEFS 30/80 = 56.3% disability  COGNITION:  Overall cognitive status: Within functional limits for tasks assessed     SENSATION: WFL   MUSCLE LENGTH: YQ:MGNO tightness bil Quads: L 107 deg R 102 deg ITB:WNL Piriformis:right mod Hip Flexors: right tight Heelcords:see ROM   POSTURE:  mild left hip IR  PALPATION:  Palpation: TTP at right gastroc/soleus with increased tissue tension; marked tenderness at achilles insertion to calcaneus     LOWER EXTREMITY ROM:  A/P ROM Right eval Left eval  Hip flexion WNL WNL  Hip external rotation WNL WNL  Knee flexion 127 130  Knee extension -5 -3  Ankle dorsiflexion -5/0 -5/5  Ankle plantarflexion 60 63  Ankle inversion WNL WNL  Ankle eversion WNL WNL   (Blank rows = not tested)  LOWER EXTREMITY MMT:  MMT Right eval Left eval Right 08/31/22  Hip flexion 4 4+   Hip extension 4+ 5   Hip abduction 4+ 5   Hip adduction 5 5   Hip internal rotation     Hip external rotation     Knee flexion 4 5   Knee  extension 4 4+   Ankle dorsiflexion _0 Ankle plantarflexion 10 and partial range 20 weakens for last 10 reps 5-7 reps Full range  Ankle inversion 4+ 4+ 5  Ankle eversion 5 4+ 5   (Blank rows = not tested)   GAIT: Distance walked: 60 Assistive device utilized: None Level of assistance: Complete Independence Comments: decreased heel strike R    TODAY'S TREATMENT: 09/23/2022 Therapeutic Exercise: to improve strength and mobility.  Demo, verbal and tactile cues throughout for technique. Bike L2 x 8 min while reviewing progress, recommendations.   Review of HEP Manual Therapy: to decrease muscle spasm and pain and improve mobility STM/TPR to R achilles, gastrocs, peroneals, soleus, skilled palpation and monitoring during dry needling. Trigger Point Dry-Needling  Treatment instructions: Expect mild to moderate muscle soreness. S/S of  pneumothorax if dry needled over a lung field, and to seek immediate medical attention should they occur. Patient verbalized understanding of these instructions and education. Patient Consent Given: Yes Education handout provided: Previously provided Muscles treated: R gastroc, soleus, peroneals, tib posterior Electrical stimulation performed: No Parameters: N/A Treatment response/outcome: Twitch Response Elicited and Palpable Increase in Muscle Length Modalities: US x 8 min to R lateral achilles 3.3 MHZ, 1.2 w/cm2 cont.   Iontophoresis with 1.0 mL 4mg/ml Dexamethasone - 4-6 hour patch (80mA-min) to R lateral ankle (patch #2 of 6) ; education on precautions, indications, and how and when to remove patch.     09/14/2022 Therapeutic Exercise: to improve strength and mobility.  Demo, verbal and tactile cues throughout for technique. Bike L2 x 9 min - while dicussing concerns.  Yoga toes- extending big toe only x 10, extending little toes only x 10, spreading toes x 10 Marble pick-ups for intrinsic muscle strengthening Towel crunches x 10 Self  Care: Education on orthotics and recommendations Manual Therapy: to decrease muscle spasm and pain and improve mobility   IASTM with s/s tools to achilles insertion, MFR to lateral ankle  09/07/2022 Therapeutic Exercise: to improve strength and mobility.  Demo, verbal and tactile cues throughout for technique. Bike L2 x 5 min Toe raises at wall 2 x 10  Single leg RDLs 2 x 10 bil - support from chair for safety Bridges x 10, with GTB x 10 Minisquats with GTB x 10 - very nervous about provoking knee pain.  Manual Therapy: to decrease muscle spasm and pain and improve mobility STM/TPR to R gastroc/soleus, skilled palpation and monitoring during dry needling. Trigger Point Dry-Needling  Treatment instructions: Expect mild to moderate muscle soreness. S/S of pneumothorax if dry needled over a lung field, and to seek immediate medical attention should they occur. Patient verbalized understanding of these instructions and education. Patient Consent Given: Yes Education handout provided: Previously provided Muscles treated: R gastroc, soleus and peroneals Electrical stimulation performed: No Parameters: N/A Treatment response/outcome: Twitch Response Elicited and Palpable Increase in Muscle Length    08/31/22 THERAPEUTIC EXERCISE:  to improve flexibility, strength and mobility.  Verbal and tactile cues throughout for technique Bike L3 x 6 min Gastroc stretch 2x 30 sec R Soleus stretch 2x 30 sec R Bil heel raise with R leg lowering x 8 On step: R single leg raise and lower with 3 sec up/hold/down x  max reps (advised pt to rest 1.5 min in between sets at home)   Manual Therapy:  STM to right gastroc/soleus and peroneals Skilled palpation and monitoring of soft tissues during DN Trigger Point Dry-Needling  Treatment instructions: Expect mild to moderate muscle soreness. S/S of pneumothorax if dry needled over a lung field, and to seek immediate medical attention should they occur. Patient  verbalized understanding of these instructions and education. Patient Consent Given: Yes Education handout provided: Previously provided Muscles treated: R gastroc, soleus and peroneals Electrical stimulation performed: No Parameters: N/A Treatment response/outcome: Twitch Response Elicited and Palpable Increase in Muscle Length      PATIENT EDUCATION:  Education details:  HEP progression 11/13 for intrinsic foot strengthening Person educated: Patient Education method: Explanation, Demonstration, Verbal cues, and Handouts Education comprehension: verbalized understanding and returned demonstration   HOME EXERCISE PROGRAM: Access Code: [email or web address] URL: [email or web address] Date: 09/14/2022 Prepared by:    Exercises added - Toe Yoga - Alternating Great Toe and Lesser Toe Extension  - 1 x daily - 7 x weekly - 2 sets -   10 reps - Towel Scrunches  - 1 x daily - 7 x weekly - 2 sets - 10 reps - Seated Marble Pick-Up with Toes  - 1 x daily - 7 x weekly - 2 sets - 10 reps   ASSESSMENT:  CLINICAL IMPRESSION: DALYLAH RAMEY reports she continues to have lateral ankle pain, also reports logging 26000 steps yesterday, on feet all day cooking for holiday.  Noted today swelling over R lateral ankle.  She does have an appointment set up to get orthotics now.  Recommended trying compression socks to decrease pain and swelling when standing for prolonged periods.  Focus of interventions today was on manual therapy and modalities to decrease R ankle pain.  Gertie Fey continues to demonstrate potential for improvement and would benefit from continued skilled therapy to address impairments.      OBJECTIVE IMPAIRMENTS Abnormal gait, decreased activity tolerance, difficulty walking, decreased ROM, decreased strength, increased fascial restrictions, increased muscle spasms, impaired flexibility, and pain.   ACTIVITY LIMITATIONS stairs and locomotion  level  PARTICIPATION LIMITATIONS:  does ADLS with pain  PERSONAL FACTORS Time since onset of injury/illness/exacerbation and 3+ comorbidities: L THA, bil knee pain and depression are also affecting patient's functional outcome.   REHAB POTENTIAL: Excellent  CLINICAL DECISION MAKING: Stable/uncomplicated  EVALUATION COMPLEXITY: Low   GOALS: Goals reviewed with patient? Yes  SHORT TERM GOALS: Target date: 08/17/2022    1. Patient will be independent with initial HEP. Baseline:  Goal status: MET    LONG TERM GOALS: Target date: 09/14/2022 extended to 10/12/2022   Patient will be independent with advanced/ongoing HEP to improve outcomes and carryover.  Baseline:  Goal status: IN PROGRESS  2.  Patient will report at least 75% improvement in R ankle pain to improve QOL. Baseline:  Goal status: IN PROGRESS  3.  Patient will demonstrate improved active R ankle DF to  +8 deg or better  to allow for normal gait and stair mechanics. Baseline:  Goal status: IN PROGRESS  4.  Patient will demonstrate improved LE strength to 5/5 to improve function. Baseline:  Goal status: IN PROGRESS  5.  Patient will be able to ascend/descend stairs with 1 HR and reciprocal step pattern safely to access home and community.  Baseline:  Goal status: IN PROGRESS  7.  Patient will report >=54/80 on LEFS  to demonstrate improved functional ability. Baseline: 45/80 Goal status: IN PROGRESS  8.  Patient will be able to walk her dog without difficulty due to R ankle deficits. Baseline:  Goal status: IN PROGRESS    PLAN: PT FREQUENCY: 2x/week  PT DURATION: 6 weeks extended 4 more weeks to 10/12/2022  PLANNED INTERVENTIONS: Therapeutic exercises, Therapeutic activity, Neuromuscular re-education, Balance training, Gait training, Patient/Family education, Self Care, Joint mobilization, Joint manipulation, Stair training, Dry Needling, Electrical stimulation, Cryotherapy, Moist heat, Taping,  Ultrasound, Ionotophoresis 108m/ml Dexamethasone, and Manual therapy  PLAN FOR NEXT SESSION: check ROM,  progress ROM and strengthening; [place] benefit noted; manual/DN to R gastroc/soleus   ERennie Natter PT, DPT 09/23/2022, 12:24 PM

## 2022-09-30 ENCOUNTER — Encounter: Payer: Self-pay | Admitting: Family Medicine

## 2022-09-30 ENCOUNTER — Encounter: Payer: BC Managed Care – PPO | Admitting: Physical Therapy

## 2022-09-30 ENCOUNTER — Ambulatory Visit: Payer: BC Managed Care – PPO | Admitting: Family Medicine

## 2022-09-30 ENCOUNTER — Ambulatory Visit: Payer: Self-pay

## 2022-09-30 VITALS — BP 120/82 | Ht 70.0 in | Wt 230.0 lb

## 2022-09-30 DIAGNOSIS — M6788 Other specified disorders of synovium and tendon, other site: Secondary | ICD-10-CM | POA: Diagnosis not present

## 2022-09-30 DIAGNOSIS — M7660 Achilles tendinitis, unspecified leg: Secondary | ICD-10-CM

## 2022-09-30 MED ORDER — NITROGLYCERIN 0.2 MG/HR TD PT24: MEDICATED_PATCH | TRANSDERMAL | 11 refills | Status: DC

## 2022-09-30 NOTE — Patient Instructions (Signed)
Nice to meet you Please use ice as needed  Please use the lift in the CAM walker  Please try the nitro patches   Please send me a message in MyChart with any questions or updates.  Please see me back in 3 weeks.   --Dr. Raeford Razor  Nitroglycerin Protocol  Apply 1/4 nitroglycerin patch to affected area daily. Change position of patch within the affected area every 24 hours. You may experience a headache during the first 1-2 weeks of using the patch, these should subside. If you experience headaches after beginning nitroglycerin patch treatment, you may take your preferred over the counter pain reliever. Another side effect of the nitroglycerin patch is skin irritation or rash related to patch adhesive. Please notify our office if you develop more severe headaches or rash, and stop the patch. Tendon healing with nitroglycerin patch may require 12 to 24 weeks depending on the extent of injury. Men should not use if taking Viagra, Cialis, or Levitra.  Do not use if you have migraines or rosacea.

## 2022-09-30 NOTE — Progress Notes (Signed)
  Tiffany Sharp - 51 y.o. female MRN [id number]  Date of birth: 01-Jun-1971  SUBJECTIVE:  Including CC & ROS.  No chief complaint on file.   Tiffany Sharp is a 51 y.o. female that is presenting with acute on chronic right Achilles pain.  The pain has been ongoing for roughly 2 years.  Has tried physical therapy.  No specific injury.    Review of Systems See HPI   HISTORY: Past Medical, Surgical, Social, and Family History Reviewed & Updated per EMR.   Pertinent Historical Findings include:  Past Medical History:  Diagnosis Date   Abscess    Breast asymmetry in female 10/12/2012   Right breast on December 2013 mammogram and ultrasound Followup ultrasound in June 2014   Breast lump    had biopsy, benign   Breast lump 08/25/2013   R breast, seen at [facility] 09/2012, likely glandular tissue.  Recommended f/u Mg in 6 months (this was prior to pregnancy)   Depression    PPD - post partum depression   Eclampsia    Endometritis    HELLP (hemolytic anemia/elev liver enzymes/low platelets in pregnancy)    History of echocardiogram    Echo 2/17: EF 60-65%, normal wall motion, mild RVE, atrial septal aneurysm (findings consistent with increased left atrial pressure)   History of toxic shock syndrome 08/25/2013   HSV-2 (herpes simplex virus 2) infection    IBS (irritable bowel syndrome)    Knee instability    Murmur    told this as a child - no specifict testing ever done   Post partum depression    Postpartum hemorrhage    Pregnancy induced hypertension    no meds outside of pregnancy   Preterm delivery 09/02/2013   Rubella non-immune status, antepartum 08/25/2013   Toxic shock syndrome ([place]) 1997   started with an abscess right axilla    Past Surgical History:  Procedure Laterality Date   BREAST BIOPSY  2013   benign   DILATION AND CURETTAGE OF UTERUS     DILATION AND EVACUATION N/A 09/01/2013   Procedure: DILATATION AND EVACUATION;  Surgeon: Delice Lesch, MD;   Location: [facility];  Service: Gynecology;  Laterality: N/A;   INCISE AND DRAIN ABCESS  1996   under left arm     PHYSICAL EXAM:  VS: BP 120/82   Ht '5\' 10"'$  (1.778 m)   Wt 230 lb (104.3 kg)   BMI 33.00 kg/m  Physical Exam Gen: NAD, alert, cooperative with exam, well-appearing MSK:  Neurovascularly intact    Limited ultrasound: Right Achilles:  Thickening appreciated roughly 4 cm proximal from the calcaneus.  There is associated hyperemia. Retrocalcaneal bursitis appreciated. No discernible tears  Summary: Achilles tendinopathy  Ultrasound and interpretation by Clearance Coots, MD    ASSESSMENT & PLAN:   Achilles tendinosis Acute on chronic in nature.  Significant changes appreciated.  History of left hip arthroplasty with mild shortening of the right leg that could be contributing. -Counseled on home exercise therapy and supportive care. -Cam walker. -Green sport insoles with heel lift on right -Nitro patches -Could consider shockwave therapy or injection.

## 2022-09-30 NOTE — Assessment & Plan Note (Signed)
Acute on chronic in nature.  Significant changes appreciated.  History of left hip arthroplasty with mild shortening of the right leg that could be contributing. -Counseled on home exercise therapy and supportive care. -Cam walker. -Green sport insoles with heel lift on right -Nitro patches -Could consider shockwave therapy or injection.

## 2022-10-07 ENCOUNTER — Encounter: Payer: BC Managed Care – PPO | Admitting: Physical Therapy

## 2022-10-12 ENCOUNTER — Encounter: Payer: BC Managed Care – PPO | Admitting: Physical Therapy

## 2022-10-21 ENCOUNTER — Encounter: Payer: Self-pay | Admitting: Family Medicine

## 2022-10-21 ENCOUNTER — Ambulatory Visit: Payer: BC Managed Care – PPO | Admitting: Family Medicine

## 2022-10-21 VITALS — BP 118/80 | Ht 70.0 in | Wt 230.0 lb

## 2022-10-21 DIAGNOSIS — M6788 Other specified disorders of synovium and tendon, other site: Secondary | ICD-10-CM | POA: Diagnosis not present

## 2022-10-21 NOTE — Assessment & Plan Note (Signed)
Acute on chronic in nature.  Continues to have tenderness and limited function of the right Achilles. -Counseled on home exercise therapy and supportive care. -Continue cam walker for 3 more weeks. -Could consider shockwave therapy

## 2022-10-21 NOTE — Patient Instructions (Signed)
Good to see you Please continue the boot   Please send me a message in MyChart with any questions or updates.  Please see me back in 3 weeks.   --Dr. Frances Ambrosino  

## 2022-10-21 NOTE — Progress Notes (Signed)
  Tiffany Sharp - 51 y.o. Tiffany MRN [id number]  Date of birth: [birth year]  SUBJECTIVE:  Including CC & ROS.  No chief complaint on file.   Tiffany Sharp is a 51 y.o. Tiffany that is following up for her right Achilles pain.  Continues to have a fair amount of pain.  She does notice pain relief when she is off of her right leg.  Has tenderness when she is barefoot.    Review of Systems See HPI   HISTORY: Past Medical, Surgical, Social, and Family History Reviewed & Updated per EMR.   Pertinent Historical Findings include:  Past Medical History:  Diagnosis Date   Abscess    Breast asymmetry in Tiffany 10/12/2012   Right breast on December 2013 mammogram and ultrasound Followup ultrasound in June 2014   Breast lump    had biopsy, benign   Breast lump 08/25/2013   R breast, [facility] at [facility] 09/2012, likely glandular tissue.  Recommended f/u Mg in 6 months (this was prior to pregnancy)   Depression    PPD - post partum depression   Eclampsia    Endometritis    HELLP (hemolytic anemia/elev liver enzymes/low platelets in pregnancy)    History of echocardiogram    Echo 2/17: EF 60-65%, normal wall motion, mild RVE, atrial septal aneurysm (findings consistent with increased left atrial pressure)   History of toxic shock syndrome 08/25/2013   HSV-2 (herpes simplex virus 2) infection    IBS (irritable bowel syndrome)    Knee instability    Murmur    told this as a child - no specifict testing ever done   Post partum depression    Postpartum hemorrhage    Pregnancy induced hypertension    no meds outside of pregnancy   Preterm delivery 09/02/2013   Rubella non-immune status, antepartum 08/25/2013   Toxic shock syndrome ([place]) 1997   started with an abscess right axilla    Past Surgical History:  Procedure Laterality Date   BREAST BIOPSY  2013   benign   DILATION AND CURETTAGE OF UTERUS     DILATION AND EVACUATION N/A 09/01/2013   Procedure: DILATATION AND EVACUATION;   Surgeon: Delice Lesch, MD;  Location: [facility];  Service: Gynecology;  Laterality: N/A;   INCISE AND DRAIN ABCESS  1996   under left arm     PHYSICAL EXAM:  VS: BP 118/80 (BP Location: Left Arm, Patient Position: Sitting)   Ht '5\' 10"'$  (1.778 m)   Wt 230 lb (104.3 kg)   BMI 33.00 kg/m  Physical Exam Gen: NAD, alert, cooperative with exam, well-appearing MSK:  Neurovascularly intact       ASSESSMENT & PLAN:   Achilles tendinosis Acute on chronic in nature.  Continues to have tenderness and limited function of the right Achilles. -Counseled on home exercise therapy and supportive care. -Continue cam walker for 3 more weeks. -Could consider shockwave therapy

## 2022-11-04 ENCOUNTER — Other Ambulatory Visit: Payer: Self-pay | Admitting: Family

## 2022-11-04 ENCOUNTER — Other Ambulatory Visit (HOSPITAL_BASED_OUTPATIENT_CLINIC_OR_DEPARTMENT_OTHER): Payer: Self-pay

## 2022-11-04 MED ORDER — LISDEXAMFETAMINE DIMESYLATE 20 MG PO CAPS
20.0000 mg | ORAL_CAPSULE | Freq: Every day | ORAL | 0 refills | Status: DC
  Filled 2022-11-04: qty 30, 30d supply, fill #0

## 2022-11-04 NOTE — Telephone Encounter (Signed)
Requesting: [place] Contract: 11/26/2021 UDS: 11/26/2021 Last Visit: 08/05/2022 Next Visit: N/A Last Refill: 08/14/2022  Please Advise

## 2022-11-09 ENCOUNTER — Ambulatory Visit: Payer: BC Managed Care – PPO | Admitting: Family Medicine

## 2022-11-09 ENCOUNTER — Encounter: Payer: Self-pay | Admitting: Family Medicine

## 2022-11-09 VITALS — BP 120/66 | Ht 70.0 in | Wt 230.0 lb

## 2022-11-09 DIAGNOSIS — M6788 Other specified disorders of synovium and tendon, other site: Secondary | ICD-10-CM | POA: Diagnosis not present

## 2022-11-09 NOTE — Assessment & Plan Note (Signed)
Doing well with immobilization.  Currently has been immobilized for 6 weeks.  Tenderness at the lower portion of the Achilles on exam. -Counseled on home exercise therapy and supportive care. -Counseled on transitioning out of the cam walker. -Continue the insoles with a heel lift. -Can initiate shockwave therapy

## 2022-11-09 NOTE — Progress Notes (Signed)
  Tiffany Sharp - 52 y.o. female MRN [id number]  Date of birth: 22-Oct-1971  SUBJECTIVE:  Including CC & ROS.  No chief complaint on file.   Tiffany Sharp is a 52 y.o. female that is following up for her acute pain.  The pain has improved with the boot.  She has been in the cam walker for 6 weeks.  She still notices pain going up and down stairs.    Review of Systems See HPI   HISTORY: Past Medical, Surgical, Social, and Family History Reviewed & Updated per EMR.   Pertinent Historical Findings include:  Past Medical History:  Diagnosis Date   Abscess    Breast asymmetry in female 10/12/2012   Right breast on December 2013 mammogram and ultrasound Followup ultrasound in June 2014   Breast lump    had biopsy, benign   Breast lump 08/25/2013   R breast, seen at [facility] 09/2012, likely glandular tissue.  Recommended f/u Mg in 6 months (this was prior to pregnancy)   Depression    PPD - post partum depression   Eclampsia    Endometritis    HELLP (hemolytic anemia/elev liver enzymes/low platelets in pregnancy)    History of echocardiogram    Echo 2/17: EF 60-65%, normal wall motion, mild RVE, atrial septal aneurysm (findings consistent with increased left atrial pressure)   History of toxic shock syndrome 08/25/2013   HSV-2 (herpes simplex virus 2) infection    IBS (irritable bowel syndrome)    Knee instability    Murmur    told this as a child - no specifict testing ever done   Post partum depression    Postpartum hemorrhage    Pregnancy induced hypertension    no meds outside of pregnancy   Preterm delivery 09/02/2013   Rubella non-immune status, antepartum 08/25/2013   Toxic shock syndrome ([place]) 1997   started with an abscess right axilla    Past Surgical History:  Procedure Laterality Date   BREAST BIOPSY  2013   benign   DILATION AND CURETTAGE OF UTERUS     DILATION AND EVACUATION N/A 09/01/2013   Procedure: DILATATION AND EVACUATION;  Surgeon: Delice Lesch, MD;  Location: [facility];  Service: Gynecology;  Laterality: N/A;   INCISE AND DRAIN ABCESS  1996   under left arm     PHYSICAL EXAM:  VS: BP 120/66   Ht '5\' 10"'$  (1.778 m)   Wt 230 lb (104.3 kg)   BMI 33.00 kg/m  Physical Exam Gen: NAD, alert, cooperative with exam, well-appearing MSK:  Neurovascularly intact       ASSESSMENT & PLAN:   Achilles tendinosis Doing well with immobilization.  Currently has been immobilized for 6 weeks.  Tenderness at the lower portion of the Achilles on exam. -Counseled on home exercise therapy and supportive care. -Counseled on transitioning out of the cam walker. -Continue the insoles with a heel lift. -Can initiate shockwave therapy

## 2022-11-09 NOTE — Patient Instructions (Signed)
Good to see you Please slowly start the exercises  Please use the boot as needed   Please send me a message in MyChart with any questions or updates.  Please see me back to start shockwave therapy .   --Dr. Raeford Razor

## 2022-11-20 ENCOUNTER — Encounter: Payer: Self-pay | Admitting: Family

## 2022-12-15 ENCOUNTER — Encounter: Payer: Self-pay | Admitting: Family

## 2022-12-28 ENCOUNTER — Other Ambulatory Visit: Payer: Self-pay | Admitting: Family

## 2022-12-28 NOTE — Telephone Encounter (Signed)
Requesting: Vyvanse '20mg'$   Contract:11/26/21 UDS: 11/26/21 Last Visit: 08/05/22 Next Visit: None Last Refill: 11/04/22 #30 and 0RF   Please Advise

## 2022-12-29 ENCOUNTER — Other Ambulatory Visit (HOSPITAL_BASED_OUTPATIENT_CLINIC_OR_DEPARTMENT_OTHER): Payer: Self-pay

## 2022-12-29 MED ORDER — LISDEXAMFETAMINE DIMESYLATE 20 MG PO CAPS
20.0000 mg | ORAL_CAPSULE | Freq: Every day | ORAL | 0 refills | Status: DC
  Filled 2022-12-29: qty 10, 10d supply, fill #0
  Filled 2022-12-29: qty 20, 20d supply, fill #0

## 2023-01-14 DIAGNOSIS — Z882 Allergy status to sulfonamides status: Secondary | ICD-10-CM | POA: Diagnosis not present

## 2023-01-14 DIAGNOSIS — Z8659 Personal history of other mental and behavioral disorders: Secondary | ICD-10-CM | POA: Diagnosis not present

## 2023-01-14 DIAGNOSIS — Z113 Encounter for screening for infections with a predominantly sexual mode of transmission: Secondary | ICD-10-CM | POA: Diagnosis not present

## 2023-01-14 DIAGNOSIS — Z1231 Encounter for screening mammogram for malignant neoplasm of breast: Secondary | ICD-10-CM | POA: Diagnosis not present

## 2023-01-14 DIAGNOSIS — Z01419 Encounter for gynecological examination (general) (routine) without abnormal findings: Secondary | ICD-10-CM | POA: Diagnosis not present

## 2023-01-14 DIAGNOSIS — B009 Herpesviral infection, unspecified: Secondary | ICD-10-CM | POA: Diagnosis not present

## 2023-01-14 DIAGNOSIS — Z124 Encounter for screening for malignant neoplasm of cervix: Secondary | ICD-10-CM | POA: Diagnosis not present

## 2023-01-14 LAB — HM MAMMOGRAPHY

## 2023-01-14 LAB — HM PAP SMEAR
Chlamydia, Swab/Urine, PCR: NEGATIVE
HPV, high-risk: NEGATIVE

## 2023-01-14 LAB — RESULTS CONSOLE HPV: CHL HPV: NEGATIVE

## 2023-01-14 LAB — OB RESULTS CONSOLE GC/CHLAMYDIA: Chlamydia: NEGATIVE

## 2023-01-20 ENCOUNTER — Other Ambulatory Visit: Payer: Self-pay | Admitting: Family Medicine

## 2023-01-20 ENCOUNTER — Encounter: Payer: Self-pay | Admitting: Family

## 2023-01-20 MED ORDER — LISDEXAMFETAMINE DIMESYLATE 20 MG PO CAPS: 20.0000 mg | ORAL_CAPSULE | Freq: Every day | ORAL | 0 refills | Status: DC

## 2023-01-20 NOTE — Telephone Encounter (Signed)
Requesting: Vyvanse 20mg   Contract: 11/26/21 UDS: 11/26/21 Last Visit: 08/05/22 Next Visit: None Last Refill: 12/29/22 #30 and 0RF   Please Advise

## 2023-01-31 ENCOUNTER — Other Ambulatory Visit: Payer: Self-pay | Admitting: Family Medicine

## 2023-01-31 ENCOUNTER — Encounter: Payer: Self-pay | Admitting: Family

## 2023-02-02 ENCOUNTER — Ambulatory Visit: Payer: BC Managed Care – PPO | Admitting: Sports Medicine

## 2023-02-08 MED ORDER — LISDEXAMFETAMINE DIMESYLATE 20 MG PO CAPS: 20.0000 mg | ORAL_CAPSULE | Freq: Every day | ORAL | 0 refills | Status: DC

## 2023-02-08 NOTE — Telephone Encounter (Signed)
Spoke to pt. Looks like generic vyvanse is covered from what I can see. She will see if she can get it filled and if not, reach back out to me and I will send Adderall.

## 2023-02-10 ENCOUNTER — Encounter: Payer: Self-pay | Admitting: Family

## 2023-02-10 MED ORDER — AMPHETAMINE-DEXTROAMPHET ER 20 MG PO CP24: 20.0000 mg | ORAL_CAPSULE | ORAL | 0 refills | Status: DC

## 2023-02-15 ENCOUNTER — Encounter: Payer: Self-pay | Admitting: *Deleted

## 2023-03-09 ENCOUNTER — Other Ambulatory Visit: Payer: Self-pay | Admitting: Family

## 2023-03-09 ENCOUNTER — Ambulatory Visit: Payer: BC Managed Care – PPO | Admitting: Family

## 2023-03-09 VITALS — BP 142/95 | HR 95 | Temp 98.0°F | Resp 16 | Ht 71.0 in | Wt 239.0 lb

## 2023-03-09 DIAGNOSIS — R253 Fasciculation: Secondary | ICD-10-CM | POA: Insufficient documentation

## 2023-03-09 DIAGNOSIS — S30860A Insect bite (nonvenomous) of lower back and pelvis, initial encounter: Secondary | ICD-10-CM

## 2023-03-09 DIAGNOSIS — R011 Cardiac murmur, unspecified: Secondary | ICD-10-CM | POA: Diagnosis not present

## 2023-03-09 DIAGNOSIS — W57XXXA Bitten or stung by nonvenomous insect and other nonvenomous arthropods, initial encounter: Secondary | ICD-10-CM | POA: Insufficient documentation

## 2023-03-09 DIAGNOSIS — R232 Flushing: Secondary | ICD-10-CM

## 2023-03-09 DIAGNOSIS — R030 Elevated blood-pressure reading, without diagnosis of hypertension: Secondary | ICD-10-CM | POA: Insufficient documentation

## 2023-03-09 DIAGNOSIS — F909 Attention-deficit hyperactivity disorder, unspecified type: Secondary | ICD-10-CM | POA: Diagnosis not present

## 2023-03-09 DIAGNOSIS — R5383 Other fatigue: Secondary | ICD-10-CM | POA: Diagnosis not present

## 2023-03-09 MED ORDER — GABAPENTIN 300 MG PO CAPS: 300.0000 mg | ORAL_CAPSULE | Freq: Every day | ORAL | 1 refills | Status: DC

## 2023-03-09 MED ORDER — DOXYCYCLINE HYCLATE 100 MG PO TABS: 200.0000 mg | ORAL_TABLET | Freq: Once | ORAL | 0 refills | Status: AC

## 2023-03-09 MED ORDER — AMPHETAMINE-DEXTROAMPHET ER 20 MG PO CP24: 20.0000 mg | ORAL_CAPSULE | ORAL | 0 refills | Status: DC

## 2023-03-09 NOTE — Assessment & Plan Note (Signed)
BP Readings from Last 3 Encounters:  03/09/23 (!) 142/95  11/09/22 120/66  10/21/22 118/80   New. Will plan to repeat in 1 month, if still elevated start bp medication.

## 2023-03-09 NOTE — Assessment & Plan Note (Signed)
Will treat with doxycyline prophylaxis 200mg  x 1.  Check lyme antibody.

## 2023-03-09 NOTE — Assessment & Plan Note (Signed)
I don't recall hearing one in the past but she states she has been told she has a murmur "sometimes."  Will check echo. Last [facility] 2017.

## 2023-03-09 NOTE — Assessment & Plan Note (Signed)
Overall stable on Adderall xr.  Vyvanse works better but is too costly and hard to find.

## 2023-03-09 NOTE — Assessment & Plan Note (Signed)
New.  Likely related to stress. Reassurance provided.

## 2023-03-09 NOTE — Progress Notes (Addendum)
Subjective:   By signing my name below, I, Isabelle Course, attest that this documentation has been prepared under the direction and in the presence of Lemont Fillers, NP 03/25/23   Patient ID: Tiffany Sharp, female    DOB: 07/29/1971, 52 y.o.   MRN: [id number]  Chief Complaint  Patient presents with   Tick Removal    Patient reports removing a tick from her back, last Friday. "It had been there for more than 2 weeks"    HPI Patient is in today for an office visit.   Tick bite: She reports having a tick on her left lower back for 2-3 weeks. She initially believed it was a pimple due to pain and soreness, but later noticed it was a tick and removed it on Friday. She endorsed soreness on her back, eye twitching, and hot flashes with diaphoresis. She denies any fever.   Refill: She has been compliant with Adderall and is requesting a refill. She has also taken Vyvance.   Hypertension:  Her blood pressure was elevated at initial check today at 142/96. When rechecked, her blood pressure was 142/95.  BP Readings from Last 3 Encounters:  03/09/23 (!) 142/95  11/09/22 120/66  10/21/22 118/80   Hot flashes: She reports she has been waking up at night due to her hot flashes. She has stopped taking her gabapentin.   Past Medical History:  Diagnosis Date   Abscess    Breast asymmetry in female 10/12/2012   Right breast on December 2013 mammogram and ultrasound Followup ultrasound in June 2014   Breast lump    had biopsy, benign   Breast lump 08/25/2013   R breast, [facility] at [facility] 09/2012, likely glandular tissue.  Recommended f/u Mg in 6 months (this was prior to pregnancy)   Depression    PPD - post partum depression   Eclampsia    Endometritis    HELLP (hemolytic anemia/elev liver enzymes/low platelets in pregnancy)    History of echocardiogram    Echo 2/17: EF 60-65%, normal wall motion, mild RVE, atrial septal aneurysm (findings consistent with increased left atrial  pressure)   History of toxic shock syndrome 08/25/2013   HSV-2 (herpes simplex virus 2) infection    IBS (irritable bowel syndrome)    Knee instability    Murmur    told this as a child - no specifict testing ever done   Post partum depression    Postpartum hemorrhage    Pregnancy induced hypertension    no meds outside of pregnancy   Preterm delivery 09/02/2013   Rubella non-immune status, antepartum 08/25/2013   Toxic shock syndrome ([facility]) 1997   started with an abscess right axilla    Past Surgical History:  Procedure Laterality Date   BREAST BIOPSY  2013   benign   DILATION AND CURETTAGE OF UTERUS     DILATION AND EVACUATION N/A 09/01/2013   Procedure: DILATATION AND EVACUATION;  Surgeon: Purcell Nails, MD;  Location: [facility];  Service: Gynecology;  Laterality: N/A;   INCISE AND DRAIN ABCESS  1996   under left arm    Family History  Problem Relation Age of Onset   Cancer Mother        rare form of melanoma (hands)   Hypertension Father    Heart disease Father        chf, no CAD   Transient ischemic attack Father    Bipolar disorder Sister    Obesity Sister  Asthma Brother    Depression Brother    Obesity Brother    Depression Maternal Grandmother        agoraphobia   Stomach cancer Maternal Grandfather    Alzheimer's disease Maternal Grandfather    Hypertension Paternal Grandmother    Hypertension Paternal Grandfather    Heart attack Neg Hx    Colon polyps Neg Hx    Colon cancer Neg Hx    Esophageal cancer Neg Hx    Rectal cancer Neg Hx     Social History   Socioeconomic History   Marital status: Married    Spouse name: Not on file   Number of children: Not on file   Years of education: Not on file   Highest education level: Bachelor's degree (e.g., BA, AB, BS)  Occupational History   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  Tobacco Use   Smoking status: Never   Smokeless tobacco: Never  Vaping Use   Vaping Use: Never used  Substance and Sexual Activity    Alcohol use: No   Drug use: No   Sexual activity: Not Currently    Birth control/protection: None  Other Topics Concern   Not on file  Social History Narrative   Married   5 daughters:   2000   2002   2004   2008   2014   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; former Chief Technology Officer degree   Pastors wife   Social Determinants of Health   Financial Resource Strain: Low Risk  (03/08/2023)   Overall Financial Resource Strain (CARDIA)    Difficulty of Paying Living Expenses: Not very hard  Food Insecurity: No Food Insecurity (03/08/2023)   Hunger Vital Sign    Worried About Running Out of Food in the Last Year: Never true    Ran Out of Food in the Last Year: Never true  Transportation Needs: No Transportation Needs (03/08/2023)   PRAPARE - Administrator, Civil Service (Medical): No    Lack of Transportation (Non-Medical): No  Physical Activity: Unknown (03/08/2023)   Exercise Vital Sign    Days of Exercise per Week: Patient declined    Minutes of Exercise per Session: Not on file  Stress: No Stress Concern Present (03/08/2023)   [employer] of Occupational Health - Occupational Stress Questionnaire    Feeling of Stress : Only a little  Social Connections: Unknown (03/08/2023)   Social Connection and Isolation Panel [NHANES]    Frequency of Communication with Friends and Family: Twice a week    Frequency of Social Gatherings with Friends and Family: Patient declined    Attends Religious Services: More than 4 times per year    Active Member of [employer] or Organizations: Yes    Attends Banker Meetings: 1 to 4 times per year    Marital Status: Married  Catering manager Violence: Not on file    Outpatient Medications Prior to Visit  Medication Sig Dispense Refill   Cyanocobalamin (B-12 PO) Take 1 each by mouth daily. Mix in drink     DIGESTIVE ENZYMES PO Take 1 tablet by mouth daily.     docusate sodium (COLACE) 100 MG capsule  Take 100 mg by mouth daily.     Multiple Vitamins-Minerals (MULTIVITAMIN WITH MINERALS) tablet Take 1 tablet by mouth daily.     amphetamine-dextroamphetamine (ADDERALL XR) 20 MG 24 hr capsule Take 1 capsule (20 mg total) by mouth every morning. 30 capsule 0   gabapentin (NEURONTIN) 300 MG  capsule Take 1 capsule (300 mg total) by mouth at bedtime. (Patient not taking: Reported on 08/03/2022) 90 capsule 1   nitroGLYCERIN (NITRODUR - DOSED IN MG/24 HR) 0.2 mg/hr patch Cut and apply 1/4 patch to most painful area q24h. 30 patch 11   No facility-administered medications prior to visit.    Allergies  Allergen Reactions   Penicillins Hives   Sulfa Antibiotics Hives    ROS See HPI    Objective:    Physical Exam Constitutional:      Appearance: Normal appearance.  Eyes:     Conjunctiva/sclera: Conjunctivae normal.     Comments: Slight twitching of muscles near right lateral eye  Cardiovascular:     Heart sounds: Murmur heard.     Systolic murmur is present with a grade of 2/6.  Pulmonary:     Effort: Pulmonary effort is normal.  Skin:    Comments: Firm nodule, mid back. Hyperpigmented.   Neurological:     Mental Status: She is alert and oriented to person, place, and time.  Psychiatric:        Mood and Affect: Mood normal.        Behavior: Behavior normal.        Thought Content: Thought content normal.        Judgment: Judgment normal.     BP (!) 142/95 (Patient Position: Sitting)   Pulse 95   Temp 98 F (36.7 C) (Oral)   Resp 16   Ht 5\' 11"  (1.803 m)   Wt 239 lb (108.4 kg)   SpO2 98%   BMI 33.33 kg/m  Wt Readings from Last 3 Encounters:  03/09/23 239 lb (108.4 kg)  11/09/22 230 lb (104.3 kg)  10/21/22 230 lb (104.3 kg)       Assessment & Plan:  Tick bite of lower back, initial encounter Assessment & Plan: Will treat with doxycyline prophylaxis 200mg  x 1.  Check lyme antibody.   Orders: -     Lyme Disease Serology w/Reflex  Murmur Assessment & Plan: I  don't recall hearing one in the past but she states she has been told she has a murmur "sometimes."  Will check echo. Last [facility] 2017.   Orders: -     ECHOCARDIOGRAM COMPLETE; Future  Attention deficit hyperactivity disorder (ADHD), unspecified ADHD type Assessment & Plan: Overall stable on Adderall xr.  Vyvanse works better but is too costly and hard to find.   Orders: -     Amphetamine-Dextroamphet ER; Take 1 capsule (20 mg total) by mouth every morning.  Dispense: 30 capsule; Refill: 0  Hot flashes Assessment & Plan: Uncontrolled, will restart gabapentin.    Elevated blood pressure reading Assessment & Plan: BP Readings from Last 3 Encounters:  03/09/23 (!) 142/95  11/09/22 120/66  10/21/22 118/80   New. Will plan to repeat in 1 month, if still elevated start bp medication.    Eye muscle twitches Assessment & Plan: New.  Likely related to stress. Reassurance provided.    Other orders -     Doxycycline Hyclate; Take 2 tablets (200 mg total) by mouth once for 1 dose.  Dispense: 2 tablet; Refill: 0 -     Gabapentin; Take 1 capsule (300 mg total) by mouth at bedtime.  Dispense: 90 capsule; Refill: 1     I,Rachel Rivera,acting as a scribe for Lemont Fillers, NP.,have documented all relevant documentation on the behalf of Lemont Fillers, NP,as directed by  Lemont Fillers, NP while in the presence  of Lemont Fillers, NP.   I, Lemont Fillers, NP, personally preformed the services described in this documentation.  All medical record entries made by the scribe were at my direction and in my presence.  I have reviewed the chart and discharge instructions (if applicable) and agree that the record reflects my personal performance and is accurate and complete. 03/25/23   Lemont Fillers, NP

## 2023-03-09 NOTE — Assessment & Plan Note (Signed)
Uncontrolled, will restart gabapentin.

## 2023-03-10 DIAGNOSIS — M170 Bilateral primary osteoarthritis of knee: Secondary | ICD-10-CM | POA: Diagnosis not present

## 2023-03-10 LAB — LYME DISEASE SEROLOGY W/REFLEX: Lyme Total Antibody EIA: NEGATIVE

## 2023-03-10 NOTE — Telephone Encounter (Signed)
:   Hi Tiffany Sharp. I was wondering if you could send in an order of Adderall for [place] downstairs.  They didn't have it at [facility] and they will not let me pick up my order until Thursday.  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

## 2023-03-24 DIAGNOSIS — M170 Bilateral primary osteoarthritis of knee: Secondary | ICD-10-CM | POA: Diagnosis not present

## 2023-03-31 DIAGNOSIS — M170 Bilateral primary osteoarthritis of knee: Secondary | ICD-10-CM | POA: Diagnosis not present

## 2023-04-06 ENCOUNTER — Other Ambulatory Visit (HOSPITAL_BASED_OUTPATIENT_CLINIC_OR_DEPARTMENT_OTHER): Payer: Self-pay

## 2023-04-06 ENCOUNTER — Telehealth: Payer: Self-pay | Admitting: Family

## 2023-04-06 ENCOUNTER — Encounter: Payer: Self-pay | Admitting: Family

## 2023-04-06 ENCOUNTER — Ambulatory Visit: Payer: BC Managed Care – PPO | Admitting: Family

## 2023-04-06 VITALS — BP 126/84 | HR 71 | Temp 98.1°F | Resp 16

## 2023-04-06 DIAGNOSIS — R030 Elevated blood-pressure reading, without diagnosis of hypertension: Secondary | ICD-10-CM | POA: Diagnosis not present

## 2023-04-06 DIAGNOSIS — Z6833 Body mass index (BMI) 33.0-33.9, adult: Secondary | ICD-10-CM

## 2023-04-06 DIAGNOSIS — R5383 Other fatigue: Secondary | ICD-10-CM

## 2023-04-06 DIAGNOSIS — F909 Attention-deficit hyperactivity disorder, unspecified type: Secondary | ICD-10-CM

## 2023-04-06 DIAGNOSIS — R011 Cardiac murmur, unspecified: Secondary | ICD-10-CM

## 2023-04-06 DIAGNOSIS — E6609 Other obesity due to excess calories: Secondary | ICD-10-CM

## 2023-04-06 LAB — CBC WITH DIFFERENTIAL/PLATELET
Basophils Absolute: 0 10*3/uL (ref 0.0–0.1)
Basophils Relative: 0.5 % (ref 0.0–3.0)
Eosinophils Absolute: 0.1 10*3/uL (ref 0.0–0.7)
Eosinophils Relative: 2.1 % (ref 0.0–5.0)
HCT: 41.4 % (ref 36.0–46.0)
Hemoglobin: 12.9 g/dL (ref 12.0–15.0)
Lymphocytes Relative: 32.1 % (ref 12.0–46.0)
Lymphs Abs: 1.9 10*3/uL (ref 0.7–4.0)
MCHC: 31.1 g/dL (ref 30.0–36.0)
MCV: 72.1 fl — ABNORMAL LOW (ref 78.0–100.0)
Monocytes Absolute: 0.5 10*3/uL (ref 0.1–1.0)
Monocytes Relative: 8.1 % (ref 3.0–12.0)
Neutro Abs: 3.3 10*3/uL (ref 1.4–7.7)
Neutrophils Relative %: 57.2 % (ref 43.0–77.0)
Platelets: 159 10*3/uL (ref 150.0–400.0)
RBC: 5.73 Mil/uL — ABNORMAL HIGH (ref 3.87–5.11)
RDW: 15.8 % — ABNORMAL HIGH (ref 11.5–15.5)
WBC: 5.8 10*3/uL (ref 4.0–10.5)

## 2023-04-06 LAB — COMPREHENSIVE METABOLIC PANEL
ALT: 14 U/L (ref 0–35)
AST: 14 U/L (ref 0–37)
Albumin: 4.2 g/dL (ref 3.5–5.2)
Alkaline Phosphatase: 79 U/L (ref 39–117)
BUN: 17 mg/dL (ref 6–23)
CO2: 31 mEq/L (ref 19–32)
Calcium: 9.7 mg/dL (ref 8.4–10.5)
Chloride: 104 mEq/L (ref 96–112)
Creatinine, Ser: 0.67 mg/dL (ref 0.40–1.20)
GFR: 100.33 mL/min (ref >60.00)
Glucose, Bld: 106 mg/dL — ABNORMAL HIGH (ref 70–99)
Potassium: 4.1 mEq/L (ref 3.5–5.1)
Sodium: 142 mEq/L (ref 135–145)
Total Bilirubin: 0.9 mg/dL (ref 0.2–1.2)
Total Protein: 6.5 g/dL (ref 6.0–8.3)

## 2023-04-06 LAB — VITAMIN D 25 HYDROXY (VIT D DEFICIENCY, FRACTURES): VITD: 19.13 ng/mL — ABNORMAL LOW (ref 30.00–100.00)

## 2023-04-06 LAB — TSH: TSH: 1.78 u[IU]/mL (ref 0.35–5.50)

## 2023-04-06 MED ORDER — AMPHETAMINE-DEXTROAMPHETAMINE 5 MG PO TABS
ORAL_TABLET | ORAL | 0 refills | Status: DC
Start: 2023-04-06 — End: 2023-05-04

## 2023-04-06 MED ORDER — VITAMIN D (ERGOCALCIFEROL) 1.25 MG (50000 UNIT) PO CAPS: 50000.0000 [IU] | ORAL_CAPSULE | ORAL | 0 refills | Status: DC

## 2023-04-06 MED ORDER — ZEPBOUND 2.5 MG/0.5ML ~~LOC~~ SOAJ
2.5000 mg | SUBCUTANEOUS | 2 refills | Status: DC
  Filled 2023-04-06: qty 2, 28d supply, fill #0

## 2023-04-06 MED ORDER — AMPHETAMINE-DEXTROAMPHET ER 20 MG PO CP24
20.0000 mg | ORAL_CAPSULE | ORAL | 0 refills | Status: DC
Start: 2023-04-06 — End: 2023-05-04

## 2023-04-06 NOTE — Assessment & Plan Note (Signed)
Currently on Adderall 20mg  with reported afternoon fatigue and increased caffeine consumption. Vyvanse was previously more effective but is not covered by insurance. -Add Adderall 5mg  in the afternoon for a boost, while monitoring for sleep disturbances. -Check in after 1 week to assess effectiveness.

## 2023-04-06 NOTE — Patient Instructions (Signed)
During your visit, we discussed your concerns about ADHD, potential heart murmur, hypertension, fatigue, and weight management. We made some adjustments to your ADHD medication and decided to continue with your scheduled echocardiogram. Your blood pressure has improved, and we will continue to monitor it. We also ordered some labs to investigate your reported fatigue. Finally, we discussed the possibility of starting a weight loss medication, pending insurance approval.  YOUR PLAN:  -ADHD: ADHD, or Attention Deficit Hyperactivity Disorder, is a condition that affects your ability to focus and control impulsive behaviors. We decided to add a 5mg  dose of Adderall in the afternoon to help with your reported fatigue. We will check in after a week to see how this is working for you.  -POSSIBLE HEART MURMUR: A heart murmur is an unusual sound heard during a heartbeat. We will continue with your scheduled echocardiogram to investigate this further. We also suggested trying over-the-counter omeprazole for your heartburn-like symptoms, which could be due to acid reflux.  -HYPERTENSION: Hypertension, or high blood pressure, can lead to serious health problems if not managed. Your blood pressure has improved, and we will continue with the current management plan. Remember to monitor your sodium intake as it can affect your blood pressure.  -FATIGUE: Fatigue is a feeling of constant tiredness or weakness. We have ordered some basic labs to investigate this further. We will also consider the possible impact of your current use of meloxicam, a medication for pain and inflammation.  -WEIGHT MANAGEMENT: Managing your weight is important for overall health. We discussed the possibility of starting a weight loss medication called Zepbound, pending insurance approval. Remember, it's important to also adopt improved dietary habits alongside medication use.  INSTRUCTIONS:  Please continue with your scheduled  echocardiogram and start taking the additional 5mg  dose of Adderall in the afternoon. Try over-the-counter omeprazole for your heartburn-like symptoms. Continue monitoring your blood pressure and sodium intake. We will check in after a week to see how the increased Adderall dosage is working for you. We will also follow up on the results of your lab tests and the insurance approval for Zepbound. Your next appointment is in 3 months.

## 2023-04-06 NOTE — Assessment & Plan Note (Signed)
Patient expressed interest in weight loss medication due to limitations in physical activity from knee issues. -Initiate prior authorization process for Zepbound, pending insurance approval and patient's BMI. -Advise patient on potential side effects and the importance of adopting improved dietary habits alongside medication use.

## 2023-04-06 NOTE — Telephone Encounter (Signed)
Vitamin D level is low.  Advise patient to begin vit D 50000 units once weekly for 12 weeks, then repeat vit D level (dx Vit D deficiency).    We can repeat her vit D level when she follows back up in 3 months.   No sign of anemia. Thyroid function is normal. Kidney/liver function normal. Sugar very mildly elevated but not in the diabetes range.

## 2023-04-06 NOTE — Telephone Encounter (Signed)
Patient notified of results, provider's comments and new rx. She verbalized understanding

## 2023-04-06 NOTE — Assessment & Plan Note (Signed)
Blood pressure improved from previous visit without addition of medication (142/95 to 126/84). -Continue current management and monitor sodium intake.

## 2023-04-06 NOTE — Progress Notes (Signed)
Subjective:     Patient ID: Tiffany Sharp, female    DOB: 09-Oct-1971, 52 y.o.   MRN: [id number]  Chief Complaint  Patient presents with   ADHD    Here for follow up    HPI  Discussed the use of AI scribe software for clinical note transcription with the patient, who gave verbal consent to proceed.  History of Present Illness   The patient, with a history of ADHD and hypertension, presents for a follow-up visit. She is currently on Adderall 20 for ADHD but reports feeling exhausted by the end of the day and wonders if an increase in dosage would make a difference. She also reports having heart palpitations and a feeling similar to heartburn.  In addition, the patient is on meloxicam for knee pain and is considering knee replacement.    Murmur-She expresses concern about the cost of the echocardiogram and the potential for a facility charge.    Obesity-The patient also mentions a potential interest in weight loss drugs due to difficulty exercising because of knee pain.      BP Readings from Last 3 Encounters:  04/06/23 126/84  03/09/23 (!) 142/95  11/09/22 120/66     Health Maintenance Due  Topic Date Due   Hepatitis C Screening  Never done   PAP SMEAR-Modifier  02/23/2020   COVID-19 Vaccine (3 - 2023-24 season) 07/03/2022   MAMMOGRAM  12/02/2022    Past Medical History:  Diagnosis Date   Abscess    Breast asymmetry in female 10/12/2012   Right breast on December 2013 mammogram and ultrasound Followup ultrasound in June 2014   Breast lump    had biopsy, benign   Breast lump 08/25/2013   R breast, seen at [facility] 09/2012, likely glandular tissue.  Recommended f/u Mg in 6 months (this was prior to pregnancy)   Depression    PPD - post partum depression   Eclampsia    Endometritis    HELLP (hemolytic anemia/elev liver enzymes/low platelets in pregnancy)    History of echocardiogram    Echo 2/17: EF 60-65%, normal wall motion, mild RVE, atrial septal  aneurysm (findings consistent with increased left atrial pressure)   History of toxic shock syndrome 08/25/2013   HSV-2 (herpes simplex virus 2) infection    IBS (irritable bowel syndrome)    Knee instability    Murmur    told this as a child - no specifict testing ever done   Post partum depression    Postpartum hemorrhage    Pregnancy induced hypertension    no meds outside of pregnancy   Preterm delivery 09/02/2013   Rubella non-immune status, antepartum 08/25/2013   Toxic shock syndrome (HCC) 1997   started with an abscess right axilla    Past Surgical History:  Procedure Laterality Date   BREAST BIOPSY  2013   benign   DILATION AND CURETTAGE OF UTERUS     DILATION AND EVACUATION N/A 09/01/2013   Procedure: DILATATION AND EVACUATION;  Surgeon: Purcell Nails, MD;  Location: [facility];  Service: Gynecology;  Laterality: N/A;   INCISE AND DRAIN ABCESS  1996   under left arm    Family History  Problem Relation Age of Onset   Cancer Mother        rare form of melanoma (hands)   Hypertension Father    Heart disease Father        chf, no CAD   Transient ischemic attack Father    Bipolar  disorder Sister    Obesity Sister    Asthma Brother    Depression Brother    Obesity Brother    Depression Maternal Grandmother        agoraphobia   Stomach cancer Maternal Grandfather    Alzheimer's disease Maternal Grandfather    Hypertension Paternal Grandmother    Hypertension Paternal Grandfather    Heart attack Neg Hx    Colon polyps Neg Hx    Colon cancer Neg Hx    Esophageal cancer Neg Hx    Rectal cancer Neg Hx     Social History   Socioeconomic History   Marital status: Married    Spouse name: Not on file   Number of children: Not on file   Years of education: Not on file   Highest education level: Bachelor's degree (e.g., BA, AB, BS)  Occupational History   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  Tobacco Use   Smoking status: Never   Smokeless tobacco: Never  Vaping Use    Vaping Use: Never used  Substance and Sexual Activity   Alcohol use: No   Drug use: No   Sexual activity: Not Currently    Birth control/protection: None  Other Topics Concern   Not on file  Social History Narrative   Married   5 daughters:   2000   2002   2004   2008   2014   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; former Chief Technology Officer degree   Pastors wife   Social Determinants of Health   Financial Resource Strain: Low Risk  (03/08/2023)   Overall Financial Resource Strain (CARDIA)    Difficulty of Paying Living Expenses: Not very hard  Food Insecurity: No Food Insecurity (03/08/2023)   Hunger Vital Sign    Worried About Running Out of Food in the Last Year: Never true    Ran Out of Food in the Last Year: Never true  Transportation Needs: No Transportation Needs (03/08/2023)   PRAPARE - Administrator, Civil Service (Medical): No    Lack of Transportation (Non-Medical): No  Physical Activity: Unknown (03/08/2023)   Exercise Vital Sign    Days of Exercise per Week: Patient declined    Minutes of Exercise per Session: Not on file  Stress: No Stress Concern Present (03/08/2023)   [employer] of Occupational Health - Occupational Stress Questionnaire    Feeling of Stress : Only a little  Social Connections: Unknown (03/08/2023)   Social Connection and Isolation Panel [NHANES]    Frequency of Communication with Friends and Family: Twice a week    Frequency of Social Gatherings with Friends and Family: Patient declined    Attends Religious Services: More than 4 times per year    Active Member of [employer] or Organizations: Yes    Attends Banker Meetings: 1 to 4 times per year    Marital Status: Married  Catering manager Violence: Not on file    Outpatient Medications Prior to Visit  Medication Sig Dispense Refill   Cyanocobalamin (B-12 PO) Take 1 each by mouth daily. Mix in drink     DIGESTIVE ENZYMES PO Take 1 tablet by  mouth daily.     docusate sodium (COLACE) 100 MG capsule Take 100 mg by mouth daily.     gabapentin (NEURONTIN) 300 MG capsule Take 1 capsule (300 mg total) by mouth at bedtime. 90 capsule 1   Multiple Vitamins-Minerals (MULTIVITAMIN WITH MINERALS) tablet Take 1 tablet by mouth daily.  amphetamine-dextroamphetamine (ADDERALL XR) 20 MG 24 hr capsule Take 1 capsule (20 mg total) by mouth every morning. 30 capsule 0   No facility-administered medications prior to visit.    Allergies  Allergen Reactions   Penicillins Hives   Sulfa Antibiotics Hives    ROS See HPI    Objective:    Physical Exam Constitutional:      General: She is not in acute distress.    Appearance: Normal appearance. She is well-developed.  HENT:     Head: Normocephalic and atraumatic.     Right Ear: External ear normal.     Left Ear: External ear normal.  Eyes:     General: No scleral icterus. Neck:     Thyroid: No thyromegaly.  Cardiovascular:     Rate and Rhythm: Normal rate and regular rhythm.     Heart sounds: Murmur heard.     Systolic murmur is present with a grade of 2/6.  Pulmonary:     Effort: Pulmonary effort is normal. No respiratory distress.     Breath sounds: Normal breath sounds. No wheezing.  Musculoskeletal:     Cervical back: Neck supple.  Skin:    General: Skin is warm and dry.  Neurological:     Mental Status: She is alert and oriented to person, place, and time.  Psychiatric:        Mood and Affect: Mood normal.        Behavior: Behavior normal.        Thought Content: Thought content normal.        Judgment: Judgment normal.     BP 126/84 (BP Location: Right Arm, Patient Position: Sitting, Cuff Size: Large)   Pulse 71   Temp 98.1 F (36.7 C) (Oral)   Resp 16   SpO2 95%  Wt Readings from Last 3 Encounters:  03/09/23 239 lb (108.4 kg)  11/09/22 230 lb (104.3 kg)  10/21/22 230 lb (104.3 kg)       Assessment & Plan:   Problem List Items Addressed This Visit        Unprioritized   Obesity, unspecified    Patient expressed interest in weight loss medication due to limitations in physical activity from knee issues. -Initiate prior authorization process for Zepbound, pending insurance approval and patient's BMI. -Advise patient on potential side effects and the importance of adopting improved dietary habits alongside medication use.      Relevant Medications   amphetamine-dextroamphetamine (ADDERALL) 5 MG tablet   tirzepatide (ZEPBOUND) 2.5 MG/0.5ML Pen   amphetamine-dextroamphetamine (ADDERALL XR) 20 MG 24 hr capsule   Murmur, cardiac     Patient scheduled for echocardiogram. No current symptoms of palpitations, but reports sensation similar to heartburn. -Continue with scheduled echocardiogram. -Consider over-the-counter omeprazole for possible reflux.      Elevated blood pressure reading    Blood pressure improved from previous visit without addition of medication (142/95 to 126/84). -Continue current management and monitor sodium intake.      ADHD (attention deficit hyperactivity disorder) - Primary     Currently on Adderall 20mg  with reported afternoon fatigue and increased caffeine consumption. Vyvanse was previously more effective but is not covered by insurance. -Add Adderall 5mg  in the afternoon for a boost, while monitoring for sleep disturbances. -Check in after 1 week to assess effectiveness.      Relevant Medications   amphetamine-dextroamphetamine (ADDERALL) 5 MG tablet   amphetamine-dextroamphetamine (ADDERALL XR) 20 MG 24 hr capsule   Other Visit Diagnoses  Fatigue, unspecified type       Relevant Orders   CBC w/Diff   TSH   Comp Met (CMET)   Vitamin D (25 hydroxy)       I am having Dayna B. Dimalanta start on amphetamine-dextroamphetamine and Zepbound. I am also having her maintain her DIGESTIVE ENZYMES PO, docusate sodium, Cyanocobalamin (B-12 PO), multivitamin with minerals, gabapentin, and  amphetamine-dextroamphetamine.  Meds ordered this encounter  Medications   amphetamine-dextroamphetamine (ADDERALL) 5 MG tablet    Sig: Take one tablet by mouth once daily in the afternoon    Dispense:  30 tablet    Refill:  0    Order Specific Question:   Supervising Provider    Answer:   Danise Edge A [[id number]]   tirzepatide (ZEPBOUND) 2.5 MG/0.5ML Pen    Sig: Inject 2.5 mg into the skin once a week.    Dispense:  2 mL    Refill:  2    Order Specific Question:   Supervising Provider    Answer:   Danise Edge A [[id number]]   amphetamine-dextroamphetamine (ADDERALL XR) 20 MG 24 hr capsule    Sig: Take 1 capsule (20 mg total) by mouth every morning.    Dispense:  30 capsule    Refill:  0    Do not fill prior to 04/12/23    Order Specific Question:   Supervising Provider    Answer:   Danise Edge A [[id number]]

## 2023-04-06 NOTE — Assessment & Plan Note (Signed)
Patient scheduled for echocardiogram. No current symptoms of palpitations, but reports sensation similar to heartburn. -Continue with scheduled echocardiogram. -Consider over-the-counter omeprazole for possible reflux.

## 2023-04-07 ENCOUNTER — Ambulatory Visit (HOSPITAL_BASED_OUTPATIENT_CLINIC_OR_DEPARTMENT_OTHER): Payer: BC Managed Care – PPO

## 2023-04-07 DIAGNOSIS — M170 Bilateral primary osteoarthritis of knee: Secondary | ICD-10-CM | POA: Diagnosis not present

## 2023-04-13 ENCOUNTER — Telehealth: Payer: Self-pay

## 2023-04-13 ENCOUNTER — Other Ambulatory Visit (HOSPITAL_BASED_OUTPATIENT_CLINIC_OR_DEPARTMENT_OTHER): Payer: Self-pay

## 2023-04-13 NOTE — Telephone Encounter (Signed)
*  Primary   PA request received via CMM for Zepbound 2.5MG /0.5ML pen-injectors  PA submitted to [facility] and is pending additional questions/determination  Key: [id number]

## 2023-04-17 NOTE — Telephone Encounter (Signed)
Patient Advocate Encounter  Prior Authorization for Zepbound 2.5MG /0.5ML pen-injectors has been approved through [employer].    Key: [id number]  Effective: 04-16-2023 to 08-18-2023

## 2023-05-04 ENCOUNTER — Other Ambulatory Visit: Payer: Self-pay | Admitting: Family

## 2023-05-04 DIAGNOSIS — F909 Attention-deficit hyperactivity disorder, unspecified type: Secondary | ICD-10-CM

## 2023-05-04 MED ORDER — AMPHETAMINE-DEXTROAMPHET ER 20 MG PO CP24
20.0000 mg | ORAL_CAPSULE | ORAL | 0 refills | Status: DC
Start: 2023-05-04 — End: 2023-07-02

## 2023-05-04 MED ORDER — AMPHETAMINE-DEXTROAMPHETAMINE 5 MG PO TABS
ORAL_TABLET | ORAL | 0 refills | Status: DC
Start: 2023-05-04 — End: 2023-07-02

## 2023-05-04 NOTE — Telephone Encounter (Signed)
Requesting: adderall xr 20mg  Contract: 04/16/23 UDS: 11/26/21 Last Visit: 04/06/23 Next Visit: None Last Refill: 04/06/23 #30 and 0RF   Please Advise

## 2023-05-04 NOTE — Telephone Encounter (Signed)
Requesting: adderall 5mg   Contract: 04/16/23 UDS: 11/26/21 Last Visit: 04/06/23 Next Visit: None Last Refill: 04/06/23 #30 and 0RF  Please Advise

## 2023-05-07 ENCOUNTER — Ambulatory Visit (HOSPITAL_COMMUNITY): Payer: BC Managed Care – PPO | Attending: Cardiology

## 2023-05-07 DIAGNOSIS — R011 Cardiac murmur, unspecified: Secondary | ICD-10-CM | POA: Insufficient documentation

## 2023-05-07 LAB — ECHOCARDIOGRAM COMPLETE
AR max vel: 2.98 cm2
AV Area VTI: 3.11 cm2
AV Area mean vel: 2.97 cm2
AV Mean grad: 6 mmHg
AV Peak grad: 11 mmHg
Ao pk vel: 1.66 m/s
Area-P 1/2: 4.21 cm2
Calc EF: 65.6 %
S' Lateral: 2.8 cm
Single Plane A2C EF: 60.6 %
Single Plane A4C EF: 67.9 %

## 2023-05-08 ENCOUNTER — Encounter: Payer: Self-pay | Admitting: Family

## 2023-05-08 DIAGNOSIS — I253 Aneurysm of heart: Secondary | ICD-10-CM

## 2023-06-24 ENCOUNTER — Other Ambulatory Visit: Payer: Self-pay | Admitting: Family

## 2023-07-02 ENCOUNTER — Other Ambulatory Visit: Payer: Self-pay | Admitting: Family

## 2023-07-02 DIAGNOSIS — E559 Vitamin D deficiency, unspecified: Secondary | ICD-10-CM

## 2023-07-02 DIAGNOSIS — F909 Attention-deficit hyperactivity disorder, unspecified type: Secondary | ICD-10-CM

## 2023-07-06 ENCOUNTER — Encounter: Payer: Self-pay | Admitting: Family

## 2023-07-06 MED ORDER — AMPHETAMINE-DEXTROAMPHET ER 20 MG PO CP24
20.0000 mg | ORAL_CAPSULE | ORAL | 0 refills | Status: DC
Start: 2023-07-06 — End: 2023-07-26

## 2023-07-06 MED ORDER — AMPHETAMINE-DEXTROAMPHETAMINE 5 MG PO TABS
ORAL_TABLET | ORAL | 0 refills | Status: DC
Start: 2023-07-06 — End: 2023-07-26

## 2023-07-06 NOTE — Telephone Encounter (Signed)
Requesting: Adderall XR 20 and IR 5 mg  Contract: 11/26/2021 (for VYVANSE) UDS: 11/26/2021 Last Visit: 04/06/2023 Next Visit: not scheduled Last Refill: 05/04/2023 one month no refills  Please Advise

## 2023-07-13 DIAGNOSIS — O1420 HELLP syndrome (HELLP), unspecified trimester: Secondary | ICD-10-CM | POA: Insufficient documentation

## 2023-07-13 DIAGNOSIS — Z8759 Personal history of other complications of pregnancy, childbirth and the puerperium: Secondary | ICD-10-CM

## 2023-07-13 DIAGNOSIS — N912 Amenorrhea, unspecified: Secondary | ICD-10-CM | POA: Insufficient documentation

## 2023-07-13 HISTORY — DX: Personal history of other complications of pregnancy, childbirth and the puerperium: Z87.59

## 2023-07-13 HISTORY — DX: HELLP syndrome (HELLP), unspecified trimester: O14.20

## 2023-07-14 ENCOUNTER — Encounter: Payer: Self-pay | Admitting: Cardiology

## 2023-07-14 ENCOUNTER — Ambulatory Visit: Payer: BC Managed Care – PPO | Attending: Cardiology | Admitting: Cardiology

## 2023-07-14 VITALS — BP 130/92 | HR 81 | Ht 71.0 in | Wt 234.0 lb

## 2023-07-14 DIAGNOSIS — Z8759 Personal history of other complications of pregnancy, childbirth and the puerperium: Secondary | ICD-10-CM | POA: Diagnosis not present

## 2023-07-14 DIAGNOSIS — R072 Precordial pain: Secondary | ICD-10-CM | POA: Diagnosis not present

## 2023-07-14 DIAGNOSIS — R0789 Other chest pain: Secondary | ICD-10-CM | POA: Diagnosis not present

## 2023-07-14 DIAGNOSIS — I253 Aneurysm of heart: Secondary | ICD-10-CM

## 2023-07-14 MED ORDER — METOPROLOL TARTRATE 100 MG PO TABS: ORAL_TABLET | ORAL | 0 refills | Status: DC

## 2023-07-14 NOTE — Progress Notes (Signed)
Cardiology Consultation:    Date:  07/14/2023   ID:  Tiffany Sharp, DOB Jan 01, 1971, MRN [id number]  PCP:  Sandford Craze, NP  Cardiologist:  Gypsy Balsam, MD   Referring MD: Sandford Craze, NP   Chief Complaint  Patient presents with   Heart Murmur    Echo show heart defect     History of Present Illness:    Tiffany Sharp is a 52 y.o. female who is being seen today for the evaluation of intra-atrial septal aneurysm at the request of Sandford Craze, NP.  Past medical history significant for preeclampsia and hypertension while she was pregnant, she was found recently to have a heart murmur echocardiogram has been performed and echocardiogram show extraordinarily large interatrial septal aneurysm bulging to his right atrium.  No evidence of PFO grossly on the test.  She was referred to [place] for consultation.  Obviously she is very concerned about it and she is worried about the situation.  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  She described to have some chest pain pain happen in different situation but typically at rest.  She describes as heaviness lasting sometimes for 1 hour.  She does not have typical exertional chest pain tightness squeezing pressure burning chest.  She never smoked does have family history of premature coronary artery disease her father apparently have congestive heart failure history having problems when he was quite young but he was a heavy smoker and did not take care of himself.  She is not on any special diet again she does not exercise on the regular basis because of Achilles tendinosis.  I did review her cholesterol profile that she had done more than 5 years ago which looks pretty good.  She did have essential hypertension and preeclampsia while being pregnant.  Past Medical History:  Diagnosis Date   Abscess    Atrial septal aneurysm     Breast asymmetry in female 10/12/2012   Right breast on December 2013 mammogram and ultrasound Followup ultrasound in June 2014   Breast lump    had biopsy, benign   Breast lump 08/25/2013   R breast, seen at [facility] 09/2012, likely glandular tissue.  Recommended f/u Mg in 6 months (this was prior to pregnancy)   Depression    PPD - post partum depression   Eclampsia    Endometritis    HELLP (hemolytic anemia/elev liver enzymes/low platelets in pregnancy)    History of echocardiogram    Echo 2/17: EF 60-65%, normal wall motion, mild RVE, atrial septal aneurysm (findings consistent with increased left atrial pressure)   History of toxic shock syndrome 08/25/2013   HSV-2 (herpes simplex virus 2) infection    IBS (irritable bowel syndrome)    Knee instability    Murmur    told this as a child - no specifict testing ever done   Post partum depression    Postpartum hemorrhage    Pregnancy induced hypertension    no meds outside of pregnancy   Preterm delivery 09/02/2013   Rubella non-immune status, antepartum 08/25/2013   Toxic shock syndrome (HCC) 1997   started with an abscess right axilla    Past Surgical History:  Procedure Laterality Date   BREAST BIOPSY  2013   benign   DILATION AND CURETTAGE OF UTERUS     DILATION AND EVACUATION N/A 09/01/2013   Procedure:  DILATATION AND EVACUATION;  Surgeon: Purcell Nails, MD;  Location: [facility];  Service: Gynecology;  Laterality: N/A;   INCISE AND DRAIN ABCESS  1996   under left arm    Current Medications: Current Meds  Medication Sig   amphetamine-dextroamphetamine (ADDERALL XR) 20 MG 24 hr capsule Take 1 capsule (20 mg total) by mouth every morning.   amphetamine-dextroamphetamine (ADDERALL) 5 MG tablet Take one tablet by mouth once daily in the afternoon (Patient taking differently: Take 5 mg by mouth daily. Take one tablet by mouth once daily in the afternoon)   Cyanocobalamin (B-12 PO) Take 1 each by mouth daily. Mix  in drink   DIGESTIVE ENZYMES PO Take 1 tablet by mouth daily.   docusate sodium (COLACE) 100 MG capsule Take 100 mg by mouth daily.   gabapentin (NEURONTIN) 300 MG capsule Take 1 capsule (300 mg total) by mouth at bedtime.   Multiple Vitamins-Minerals (MULTIVITAMIN WITH MINERALS) tablet Take 1 tablet by mouth daily.   Vitamin D, Ergocalciferol, (DRISDOL) 1.25 MG (50000 UNIT) CAPS capsule Take 1 capsule (50,000 Units total) by mouth every 7 (seven) days.   [DISCONTINUED] tirzepatide (ZEPBOUND) 2.5 MG/0.5ML Pen Inject 2.5 mg into the skin once a week.     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  Tobacco Use   Smoking status: Never   Smokeless tobacco: Never  Vaping Use   Vaping status: Never Used  Substance and Sexual Activity   Alcohol use: No   Drug use: No   Sexual activity: Not Currently    Birth control/protection: None  Other Topics Concern   Not on file  Social History Narrative   Married   5 daughters:   2000   2002   2004   2008   2014   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; former Chief Technology Officer degree   Pastors wife   Social Determinants of Health   Financial Resource Strain: Low Risk  (03/08/2023)   Overall Financial Resource Strain (CARDIA)    Difficulty of Paying Living Expenses: Not very hard  Food Insecurity: No Food Insecurity (03/08/2023)   Hunger Vital Sign    Worried About Running Out of Food in the Last Year: Never true    Ran Out of Food in the Last Year: Never true  Transportation Needs: No Transportation Needs (03/08/2023)   PRAPARE - Administrator, Civil Service (Medical): No    Lack of Transportation (Non-Medical): No   Physical Activity: Unknown (03/08/2023)   Exercise Vital Sign    Days of Exercise per Week: Patient declined    Minutes of Exercise per Session: Not on file  Stress: No Stress Concern Present (03/08/2023)   [employer] of Occupational Health - Occupational Stress Questionnaire    Feeling of Stress : Only a little  Social Connections: Unknown (03/08/2023)   Social Connection and Isolation Panel [NHANES]    Frequency of Communication with Friends and Family: Twice a week    Frequency of Social Gatherings with Friends and Family: Patient declined    Attends Religious Services: More than 4 times per year    Active Member of Clubs or  Organizations: Yes    Attends Engineer, structural: 1 to 4 times per year    Marital Status: Married     Family History: The patient's family history includes Alzheimer's disease in her maternal grandfather; Asthma in her brother; Bipolar disorder in her sister; Cancer in her mother; Depression in her brother and maternal grandmother; Heart disease in her father; Hypertension in her father, paternal grandfather, and paternal grandmother; Obesity in her brother and sister; Stomach cancer in her maternal grandfather; Transient ischemic attack in her father. There is no history of Heart attack, Colon polyps, Colon cancer, Esophageal cancer, or Rectal cancer. ROS:   Please see the history of present illness.    All 14 point review of systems negative except as described per history of present illness.  EKGs/Labs/Other Studies Reviewed:    The following studies were reviewed today: Echocardiogram showed no significant valvular pathology, chamber sizes normal extraordinary large interatrial septal aneurysm  EKG:       Recent Labs: 04/06/2023: ALT 14; BUN 17; Creatinine, Ser 0.67; Hemoglobin 12.9; Platelets 159.0; Potassium 4.1; Sodium 142; TSH 1.78  Recent Lipid Panel    Component Value Date/Time   CHOL 121 10/08/2017 0913   TRIG 48.0 10/08/2017 0913    HDL 46.20 10/08/2017 0913   CHOLHDL 3 10/08/2017 0913   VLDL 9.6 10/08/2017 0913   LDLCALC 65 10/08/2017 0913    Physical Exam:    VS:  BP (!) 130/92 (BP Location: Left Arm, Patient Position: Sitting)   Pulse 81   Ht 5\' 11"  (1.803 m)   Wt 234 lb (106.1 kg)   SpO2 93%   BMI 32.64 kg/m     Wt Readings from Last 3 Encounters:  07/14/23 234 lb (106.1 kg)  03/09/23 239 lb (108.4 kg)  11/09/22 230 lb (104.3 kg)     GEN:  Well nourished, well developed in no acute distress HEENT: Normal NECK: No JVD; No carotid bruits LYMPHATICS: No lymphadenopathy CARDIAC: RRR, no murmurs, no rubs, no gallops RESPIRATORY:  Clear to auscultation without rales, wheezing or rhonchi  ABDOMEN: Soft, non-tender, non-distended MUSCULOSKELETAL:  No edema; No deformity  SKIN: Warm and dry NEUROLOGIC:  Alert and oriented x 3 PSYCHIATRIC:  Normal affect   ASSESSMENT:    1. Atrial septal aneurysm   2. Atypical chest pain   3. History of eclampsia    PLAN:    In order of problems listed above:  Atrial septal aneurysm usually it is not concerning lesion however after admitting her situation it is quite large at least 2 to 2-1/2 cm bulging towards the right atrium.  Based on the echocardiogram I do not see any evidence of PFO however sometimes interatrial septal aneurysm can be associated with additional congenital issues I think there will be benefits for her to have coronary CT angio done to look at her anatomy make sure were not dealing with additional problems.  On top of that she does have a chest pain after mid atypical but will look at the coronary arteries make sure we do not have any coronary artery disease.  Her risk factors are only mild.  She only family history of premature coronary artery disease and preeclampsia while she was pregnant but still will be reasonable to do coronary CT angio for her atypical chest pain. Dyslipidemia previously her cholesterol was normal I will ask her to have  fasting lipid profile done. I will see her back in my office in about 3 to 4 months  Medication Adjustments/Labs and Tests Ordered: Current medicines are reviewed at length with the patient today.  Concerns regarding medicines are outlined above.  Orders Placed This Encounter  Procedures   EKG 12-Lead   No orders of the defined types were placed in this encounter.   Signed, Georgeanna Lea, MD, [facility]. 07/14/2023 10:33 AM    [facility]

## 2023-07-14 NOTE — Patient Instructions (Addendum)
Medication Instructions:   TAKE: Metoprolol 100mg  1 tablet 2 hours prior to CT scan   Lab Work: 3rd Floor Suite  Lipid panel, BMP- today If you have labs (blood work) drawn today and your tests are completely normal, you will receive your results only by: [employer] (if you have MyChart) OR A paper copy in the mail If you have any lab test that is abnormal or we need to change your treatment, we will call you to review the results.   Testing/Procedures:  Your cardiac CT will be scheduled at one of the below locations:   [facility] [address] [place], [place] [address] [phone]  If scheduled at [facility], please arrive at the [facility] Entrance (Entrance C2) of [facility] 30 minutes prior to test start time. You can use the FREE valet parking offered at entrance C (encouraged to control the heart rate for the test)  Proceed to the [facility] Radiology Department (first floor) to check-in and test prep.  All radiology patients and guests should use entrance C2 at [facility], accessed from [facility], even though the hospital's physical address listed is [address].      Please follow these instructions carefully (unless otherwise directed):    On the Night Before the Test: Be sure to Drink plenty of water. Do not consume any caffeinated/decaffeinated beverages or chocolate 12 hours prior to your test. Do not take any antihistamines 12 hours prior to your test.   On the Day of the Test: Drink plenty of water until 1 hour prior to the test. Do not eat any food 4 hours prior to the test. You may take your regular medications prior to the test.  Take metoprolol (Lopressor) two hours prior to test. FEMALES- please wear underwire-free bra if available, avoid dresses & tight clothing        After the Test: Drink plenty of water. After receiving IV contrast, you may experience a  mild flushed feeling. This is normal. On occasion, you may experience a mild rash up to 24 hours after the test. This is not dangerous. If this occurs, you can take Benadryl 25 mg and increase your fluid intake. If you experience trouble breathing, this can be serious. If it is severe call 911 IMMEDIATELY. If it is mild, please call our office. If you take any of these medications: Glipizide/Metformin, Avandament, Glucavance, please do not take 48 hours after completing test unless otherwise instructed.  We will call to schedule your test 2-4 weeks out understanding that some insurance companies will need an authorization prior to the service being performed.   For non-scheduling related questions, please contact the cardiac imaging nurse navigator should you have any questions/concerns: Rockwell Alexandria, Cardiac Imaging Nurse Navigator Larey Brick, Cardiac Imaging Nurse Navigator [facility] Direct Office Dial: [phone]   For scheduling needs, including cancellations and rescheduling, please call [place], [phone].    Follow-Up: At [facility], you and your health needs are our priority.  As part of our continuing mission to provide you with exceptional heart care, we have created designated Provider Care Teams.  These Care Teams include your primary Cardiologist (physician) and Advanced Practice Providers (APPs -  Physician Assistants and Nurse Practitioners) who all work together to provide you with the care you need, when you need it.  We recommend signing up for the patient portal called "MyChart".  Sign up information is  provided on this After Visit Summary.  MyChart is used to connect with patients for Virtual Visits (Telemedicine).  Patients are able to view lab/test results, encounter notes, upcoming appointments, etc.  Non-urgent messages can be sent to your provider as well.   To learn more about what you can do with MyChart, go to  [email or web address].    Your next appointment:   3 month(s)  The format for your next appointment:   In Person  Provider:   Gypsy Balsam, MD    Other Instructions NA

## 2023-07-15 LAB — LIPID PANEL
Chol/HDL Ratio: 3 ratio (ref 0.0–4.4)
Cholesterol, Total: 184 mg/dL (ref 100–199)
HDL: 61 mg/dL (ref >39)
LDL Chol Calc (NIH): 109 mg/dL — ABNORMAL HIGH (ref 0–99)
Triglycerides: 78 mg/dL (ref 0–149)
VLDL Cholesterol Cal: 14 mg/dL (ref 5–40)

## 2023-07-15 LAB — BASIC METABOLIC PANEL
BUN/Creatinine Ratio: 20 (ref 9–23)
BUN: 13 mg/dL (ref 6–24)
CO2: 25 mmol/L (ref 20–29)
Calcium: 9.5 mg/dL (ref 8.7–10.2)
Chloride: 104 mmol/L (ref 96–106)
Creatinine, Ser: 0.66 mg/dL (ref 0.57–1.00)
Glucose: 106 mg/dL — ABNORMAL HIGH (ref 70–99)
Potassium: 4.9 mmol/L (ref 3.5–5.2)
Sodium: 144 mmol/L (ref 134–144)
eGFR: 105 mL/min/{1.73_m2} (ref >59)

## 2023-07-20 ENCOUNTER — Telehealth: Payer: Self-pay

## 2023-07-20 NOTE — Telephone Encounter (Signed)
Patient notified of results and verbalized understanding.  

## 2023-07-20 NOTE — Telephone Encounter (Signed)
-----   Message from Gypsy Balsam sent at 07/16/2023 12:19 PM EDT ----- Nothing dramatic on the blood test, cholesterol mildly elevated will wait for coronary CT angio to decide what to do next

## 2023-07-21 ENCOUNTER — Encounter (HOSPITAL_COMMUNITY): Payer: Self-pay

## 2023-07-21 ENCOUNTER — Ambulatory Visit: Payer: BC Managed Care – PPO | Admitting: Family

## 2023-07-22 ENCOUNTER — Telehealth (HOSPITAL_COMMUNITY): Payer: Self-pay | Admitting: *Deleted

## 2023-07-22 NOTE — Telephone Encounter (Signed)
Reaching out to patient to offer assistance regarding upcoming cardiac imaging study; pt verbalizes understanding of appt date/time, parking situation and where to check in, pre-test NPO status and medications ordered, and verified current allergies; name and call back number provided for further questions should they arise Hayley Sharpe RN Navigator Cardiac Imaging [facility] [phone] office [phone] cell  

## 2023-07-23 ENCOUNTER — Ambulatory Visit (HOSPITAL_COMMUNITY)
Admission: RE | Admit: 2023-07-23 | Discharge: 2023-07-23 | Disposition: A | Discharge status: Home / Self Care | Payer: BC Managed Care – PPO | Source: Ambulatory Visit | Attending: Cardiology | Admitting: Cardiology

## 2023-07-23 DIAGNOSIS — R072 Precordial pain: Secondary | ICD-10-CM | POA: Diagnosis not present

## 2023-07-23 MED ORDER — IOHEXOL 350 MG/ML SOLN: 95.0000 mL | Freq: Once | INTRAVENOUS | Status: DC | PRN

## 2023-07-23 MED ORDER — NITROGLYCERIN 0.4 MG SL SUBL
0.8000 mg | SUBLINGUAL_TABLET | Freq: Once | SUBLINGUAL | Status: AC
  Administered 2023-07-23: 0.8 mg via SUBLINGUAL

## 2023-07-23 MED ORDER — IOHEXOL 300 MG/ML  SOLN
95.0000 mL | Freq: Once | INTRAMUSCULAR | Status: AC | PRN
  Administered 2023-07-23: 95 mL via INTRAVENOUS

## 2023-07-23 MED ORDER — NITROGLYCERIN 0.4 MG SL SUBL
SUBLINGUAL_TABLET | SUBLINGUAL | Status: AC
  Filled 2023-07-23: qty 1

## 2023-07-26 ENCOUNTER — Other Ambulatory Visit (HOSPITAL_BASED_OUTPATIENT_CLINIC_OR_DEPARTMENT_OTHER): Payer: Self-pay

## 2023-07-26 ENCOUNTER — Ambulatory Visit: Payer: BC Managed Care – PPO | Admitting: Family

## 2023-07-26 VITALS — BP 137/89 | HR 73 | Temp 98.0°F | Resp 16 | Wt 236.0 lb

## 2023-07-26 DIAGNOSIS — F909 Attention-deficit hyperactivity disorder, unspecified type: Secondary | ICD-10-CM | POA: Diagnosis not present

## 2023-07-26 DIAGNOSIS — Z23 Encounter for immunization: Secondary | ICD-10-CM | POA: Diagnosis not present

## 2023-07-26 DIAGNOSIS — R0789 Other chest pain: Secondary | ICD-10-CM

## 2023-07-26 MED ORDER — AMPHETAMINE-DEXTROAMPHETAMINE 5 MG PO TABS
5.0000 mg | ORAL_TABLET | Freq: Every day | ORAL | 0 refills | Status: DC
  Filled 2023-07-26: qty 30, fill #0
  Filled 2023-09-13: qty 30, 30d supply, fill #0

## 2023-07-26 MED ORDER — AMPHETAMINE-DEXTROAMPHETAMINE 5 MG PO TABS
ORAL_TABLET | ORAL | 0 refills | Status: DC
Start: 2023-07-26 — End: 2023-07-26

## 2023-07-26 MED ORDER — AMPHETAMINE-DEXTROAMPHET ER 20 MG PO CP24
20.0000 mg | ORAL_CAPSULE | ORAL | 0 refills | Status: DC
  Filled 2023-07-26 – 2023-08-02 (×2): qty 30, 30d supply, fill #0

## 2023-07-26 MED ORDER — AMPHETAMINE-DEXTROAMPHET ER 20 MG PO CP24
20.0000 mg | ORAL_CAPSULE | ORAL | 0 refills | Status: DC
Start: 2023-07-26 — End: 2023-07-26

## 2023-07-26 MED ORDER — PANTOPRAZOLE SODIUM 40 MG PO TBEC: 40.0000 mg | DELAYED_RELEASE_TABLET | Freq: Every day | ORAL | 0 refills | Status: DC

## 2023-07-26 MED ORDER — PANTOPRAZOLE SODIUM 40 MG PO TBEC
40.0000 mg | DELAYED_RELEASE_TABLET | Freq: Every day | ORAL | 0 refills | Status: DC
Start: 2023-07-26 — End: 2023-12-31
  Filled 2023-07-26: qty 90, 90d supply, fill #0
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
## 2023-07-26 NOTE — Assessment & Plan Note (Signed)
She is working with cardiology. She last saw cardiology on 9/11.  A CT calcium scoring test was performed which showed 0% calcification of her coronary arteries.   I suspect stress and GERD to be the major cause of her atypical chest pain. Will initiate pantoprazole 40 mg once daily. I don't think that potential mold exposure is the cause for her chest pain, especially since she is no longer in that environment.

## 2023-07-26 NOTE — Patient Instructions (Signed)
VISIT SUMMARY:  During your visit, we discussed your ongoing chest discomfort and difficulty obtaining your ADHD medication. We also discussed your general health maintenance, including vaccinations.  YOUR PLAN:  -CHEST DISCOMFORT: Your chest discomfort could be due to gastroesophageal reflux disease (GERD), which is a condition where stomach acid frequently flows back into the tube connecting your mouth and stomach, or it could be stress-related. It's less likely to be heart-related given your recent heart scan results. We'll start you on a stronger antacid medication and reevaluate in a month.  -ADHD: We understand you've been having difficulty obtaining your ADHD medication, Adderall. ADHD, or Attention Deficit Hyperactivity Disorder, is a brain disorder marked by an ongoing pattern of inattention and/or hyperactivity-impulsivity that interferes with functioning or development. We'll resend the prescription to the pharmacy downstairs for your convenience.  -GENERAL HEALTH MAINTENANCE: As part of your general health maintenance, we administered the influenza vaccine today. We also recommend considering a COVID-19 booster shot in 12 weeks, given your recent infection.  INSTRUCTIONS:  Please start taking the prescribed antacid medication for your chest discomfort. We'll reevaluate your condition in a month. Also, your Adderall prescription has been sent to the downstairs pharmacy. For your general health, we administered the influenza vaccine today and recommend considering a COVID-19 booster shot in 12 weeks.

## 2023-07-26 NOTE — Assessment & Plan Note (Signed)
Stable on current adderall  regimen. Refills sent to the [facility] pharmacy as they have a good stock of adderall typically. I cancelled the refills at [facility].

## 2023-07-26 NOTE — Progress Notes (Signed)
Subjective:     Patient ID: Tiffany Sharp, female    DOB: 29-Mar-1971, 52 y.o.   MRN: [id number]  Chief Complaint  Patient presents with   Mold exposure    Here for possible mole exposure    HPI  Discussed the use of AI scribe software for clinical note transcription with the patient, who gave verbal consent to proceed.   Patient is a 52 year old female that presents to the clinic today for possible mold exposure. She states that she is have construction done in house last week, top ceiling in the house.  She states she has not gotten toxicology report yet The only symptom that she is having at this time is chest pain or pressure. Chest pain noted in left central chest area (pressure) not going away with Prilosec.Not having any other symptoms. No one in the house is having symptoms  Nothing makes the chest pressure worse or better per patients report.   Patient states that the pressure is constant.   She states that she just had a CT Coronary done on 07/12/2023. Impression shows that coronary calcium score is 0. Total plaque is 0 mm3. Normal coronary arteries. Interatrial septum aneurysm with bulging from left to right.There is evidence of contrast extravasation from left to right, consistent with PFO.    Health Maintenance Due  Topic Date Due   Hepatitis C Screening  Never done   Cervical Cancer Screening (HPV/Pap Cotest)  02/23/2020   MAMMOGRAM  12/02/2022   INFLUENZA VACCINE  06/03/2023   COVID-19 Vaccine (3 - 2023-24 season) 07/04/2023    Past Medical History:  Diagnosis Date   Abscess    Atrial septal aneurysm    Breast asymmetry in female 10/12/2012   Right breast on December 2013 mammogram and ultrasound Followup ultrasound in June 2014   Breast lump    had biopsy, benign   Breast lump 08/25/2013   R breast, [facility] at [facility] 09/2012, likely glandular tissue.  Recommended f/u Mg in 6 months (this was prior to pregnancy)   Depression    PPD - post partum  depression   Eclampsia    Endometritis    HELLP (hemolytic anemia/elev liver enzymes/low platelets in pregnancy)    History of echocardiogram    Echo 2/17: EF 60-65%, normal wall motion, mild RVE, atrial septal aneurysm (findings consistent with increased left atrial pressure)   History of toxic shock syndrome 08/25/2013   HSV-2 (herpes simplex virus 2) infection    IBS (irritable bowel syndrome)    Knee instability    Murmur    told this as a child - no specifict testing ever done   Post partum depression    Postpartum hemorrhage    Pregnancy induced hypertension    no meds outside of pregnancy   Preterm delivery 09/02/2013   Rubella non-immune status, antepartum 08/25/2013   Toxic shock syndrome (HCC) 1997   started with an abscess right axilla    Past Surgical History:  Procedure Laterality Date   BREAST BIOPSY  2013   benign   DILATION AND CURETTAGE OF UTERUS     DILATION AND EVACUATION N/A 09/01/2013   Procedure: DILATATION AND EVACUATION;  Surgeon: Purcell Nails, MD;  Location: [facility];  Service: Gynecology;  Laterality: N/A;   INCISE AND DRAIN ABCESS  1996   under left arm    Family History  Problem Relation Age of Onset   Cancer Mother  rare form of melanoma (hands)   Hypertension Father    Heart disease Father        chf, no CAD   Transient ischemic attack Father    Bipolar disorder Sister    Obesity Sister    Asthma Brother    Depression Brother    Obesity Brother    Depression Maternal Grandmother        agoraphobia   Stomach cancer Maternal Grandfather    Alzheimer's disease Maternal Grandfather    Hypertension Paternal Grandmother    Hypertension Paternal Grandfather    Heart attack Neg Hx    Colon polyps Neg Hx    Colon cancer Neg Hx    Esophageal cancer Neg Hx    Rectal cancer Neg Hx     Social History   Socioeconomic History   Marital status: Married    Spouse name: Not on file   Number of children: Not on file   Years of  education: Not on file   Highest education level: Bachelor's degree (e.g., BA, AB, BS)  Occupational History   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  Tobacco Use   Smoking status: Never   Smokeless tobacco: Never  Vaping Use   Vaping status: Never Used  Substance and Sexual Activity   Alcohol use: No   Drug use: No   Sexual activity: Not Currently    Birth control/protection: None  Other Topics Concern   Not on file  Social History Narrative   Married   5 daughters:   2000   2002   2004   2008   2014   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; former Chief Technology Officer degree   Pastors wife   Social Determinants of Health   Financial Resource Strain: Low Risk  (03/08/2023)   Overall Financial Resource Strain (CARDIA)    Difficulty of Paying Living Expenses: Not very hard  Food Insecurity: No Food Insecurity (03/08/2023)   Hunger Vital Sign    Worried About Running Out of Food in the Last Year: Never true    Ran Out of Food in the Last Year: Never true  Transportation Needs: No Transportation Needs (03/08/2023)   PRAPARE - Administrator, Civil Service (Medical): No    Lack of Transportation (Non-Medical): No  Physical Activity: Unknown (03/08/2023)   Exercise Vital Sign    Days of Exercise per Week: Patient declined    Minutes of Exercise per Session: Not on file  Stress: No Stress Concern Present (03/08/2023)   [employer] of Occupational Health - Occupational Stress Questionnaire    Feeling of Stress : Only a little  Social Connections: Unknown (03/08/2023)   Social Connection and Isolation Panel [NHANES]    Frequency of Communication with Friends and Family: Twice a week    Frequency of Social Gatherings with Friends and Family: Patient declined    Attends Religious Services: More than 4 times per year    Active Member of [employer] or Organizations: Yes    Attends Banker Meetings: 1 to 4 times per year    Marital Status: Married   Catering manager Violence: Not on file    Outpatient Medications Prior to Visit  Medication Sig Dispense Refill   amphetamine-dextroamphetamine (ADDERALL XR) 20 MG 24 hr capsule Take 1 capsule (20 mg total) by mouth every morning. 30 capsule 0   amphetamine-dextroamphetamine (ADDERALL) 5 MG tablet Take one tablet by mouth once daily in the afternoon (Patient taking differently: Take 5  mg by mouth daily. Take one tablet by mouth once daily in the afternoon) 30 tablet 0   Cyanocobalamin (B-12 PO) Take 1 each by mouth daily. Mix in drink     DIGESTIVE ENZYMES PO Take 1 tablet by mouth daily.     docusate sodium (COLACE) 100 MG capsule Take 100 mg by mouth daily.     gabapentin (NEURONTIN) 300 MG capsule Take 1 capsule (300 mg total) by mouth at bedtime. 90 capsule 1   metoprolol tartrate (LOPRESSOR) 100 MG tablet Take one tablet 2 hours before cardiac CT for heart greater than 55 1 tablet 0   Multiple Vitamins-Minerals (MULTIVITAMIN WITH MINERALS) tablet Take 1 tablet by mouth daily.     Vitamin D, Ergocalciferol, (DRISDOL) 1.25 MG (50000 UNIT) CAPS capsule Take 1 capsule (50,000 Units total) by mouth every 7 (seven) days. 12 capsule 0   No facility-administered medications prior to visit.    Allergies  Allergen Reactions   Penicillins Hives   Sulfa Antibiotics Hives    Review of Systems  Constitutional:  Negative for fever.  HENT:  Negative for tinnitus.   Cardiovascular:  Positive for chest pain.  Musculoskeletal:  Negative for back pain and neck pain.  Neurological:  Negative for dizziness and headaches.  Psychiatric/Behavioral:  Negative for depression and suicidal ideas.        Objective:    Physical Exam Vitals reviewed.  Constitutional:      Appearance: Normal appearance.  HENT:     Head: Normocephalic.  Cardiovascular:     Rate and Rhythm: Normal rate and regular rhythm.     Pulses: Normal pulses.     Heart sounds: Normal heart sounds.     Comments: Chest pressure  noted in left central chest  Pulmonary:     Effort: Pulmonary effort is normal.     Breath sounds: Normal breath sounds.  Chest:       Comments: Chest pressure  Musculoskeletal:        General: Normal range of motion.     Cervical back: Normal range of motion.  Skin:    General: Skin is warm.  Neurological:     General: No focal deficit present.     Mental Status: She is alert and oriented to person, place, and time. Mental status is at baseline.  Psychiatric:        Mood and Affect: Mood normal.        Behavior: Behavior normal.        Thought Content: Thought content normal.        Judgment: Judgment normal.      BP 137/89 (BP Location: Right Arm, Patient Position: Sitting, Cuff Size: Large)   Pulse 73   Temp 98 F (36.7 C) (Oral)   Resp 16   Wt 236 lb (107 kg)   SpO2 99%   BMI 32.92 kg/m  Wt Readings from Last 3 Encounters:  07/26/23 236 lb (107 kg)  07/14/23 234 lb (106.1 kg)  03/09/23 239 lb (108.4 kg)       Assessment & Plan:   Problem List Items Addressed This Visit   None   I am having Laparis B. Kopera maintain her DIGESTIVE ENZYMES PO, docusate sodium, Cyanocobalamin (B-12 PO), multivitamin with minerals, gabapentin, Vitamin D (Ergocalciferol), amphetamine-dextroamphetamine, amphetamine-dextroamphetamine, and metoprolol tartrate.  No orders of the defined types were placed in this encounter.

## 2023-07-30 ENCOUNTER — Other Ambulatory Visit (HOSPITAL_BASED_OUTPATIENT_CLINIC_OR_DEPARTMENT_OTHER): Payer: Self-pay

## 2023-07-30 ENCOUNTER — Other Ambulatory Visit: Payer: Self-pay

## 2023-08-02 ENCOUNTER — Other Ambulatory Visit (HOSPITAL_BASED_OUTPATIENT_CLINIC_OR_DEPARTMENT_OTHER): Payer: Self-pay

## 2023-08-04 ENCOUNTER — Telehealth: Payer: Self-pay | Admitting: Pharmacist

## 2023-08-04 NOTE — Telephone Encounter (Signed)
Pharmacy Patient Advocate Encounter   Received notification from CoverMyMeds that prior authorization for Pantoprazole Sodium 40MG  dr tablets is required/requested.   Insurance verification completed.   The patient is insured through [facility] .   Per test claim: PA required; PA started via CoverMyMeds. KEY [id number] . Waiting for clinical questions to populate.

## 2023-08-12 NOTE — Telephone Encounter (Signed)
Answered clinical questions and submitted to insurance

## 2023-08-13 ENCOUNTER — Other Ambulatory Visit (HOSPITAL_BASED_OUTPATIENT_CLINIC_OR_DEPARTMENT_OTHER): Payer: Self-pay

## 2023-08-13 NOTE — Telephone Encounter (Signed)
Pharmacy Patient Advocate Encounter  Received notification from [facility] that Prior Authorization for Pantoprazole Sodium 40MG  Tablet DR has been APPROVED from 08-12-2023 to 08-11-2024   PA #/Case ID/Reference #: [facility]

## 2023-08-16 ENCOUNTER — Telehealth: Payer: Self-pay

## 2023-08-16 NOTE — Telephone Encounter (Signed)
Pt viewed results on My Chart per Dr. Hulen Shouts note. Routed to PCP.

## 2023-08-31 ENCOUNTER — Other Ambulatory Visit: Payer: Self-pay | Admitting: Family

## 2023-09-13 ENCOUNTER — Other Ambulatory Visit: Payer: Self-pay | Admitting: Family

## 2023-09-13 ENCOUNTER — Other Ambulatory Visit (HOSPITAL_BASED_OUTPATIENT_CLINIC_OR_DEPARTMENT_OTHER): Payer: Self-pay

## 2023-09-13 DIAGNOSIS — F909 Attention-deficit hyperactivity disorder, unspecified type: Secondary | ICD-10-CM

## 2023-09-13 MED ORDER — AMPHETAMINE-DEXTROAMPHET ER 20 MG PO CP24
20.0000 mg | ORAL_CAPSULE | ORAL | 0 refills | Status: DC
  Filled 2023-09-13: qty 30, 30d supply, fill #0

## 2023-09-13 NOTE — Telephone Encounter (Signed)
Requesting: Adderall XR 20mg   Contract: 04/16/23 UDS: 11/26/21 Last Visit: 07/26/23 Next Visit: None Last Refill: 07/26/23 #30 and 0RF   Please Advise

## 2023-09-15 ENCOUNTER — Other Ambulatory Visit (HOSPITAL_BASED_OUTPATIENT_CLINIC_OR_DEPARTMENT_OTHER): Payer: Self-pay

## 2023-09-28 ENCOUNTER — Ambulatory Visit: Payer: BC Managed Care – PPO | Admitting: Family Medicine

## 2023-09-28 ENCOUNTER — Encounter: Payer: Self-pay | Admitting: Family Medicine

## 2023-09-28 VITALS — BP 109/76 | HR 72 | Temp 98.0°F | Ht 71.0 in | Wt 239.0 lb

## 2023-09-28 DIAGNOSIS — H6993 Unspecified Eustachian tube disorder, bilateral: Secondary | ICD-10-CM | POA: Diagnosis not present

## 2023-09-28 MED ORDER — PREDNISONE 20 MG PO TABS
20.0000 mg | ORAL_TABLET | Freq: Every day | ORAL | 0 refills | Status: AC
Start: 2023-09-28 — End: 2023-10-03

## 2023-09-28 MED ORDER — CIPROFLOXACIN-DEXAMETHASONE 0.3-0.1 % OT SUSP
4.0000 [drp] | Freq: Two times a day (BID) | OTIC | 0 refills | Status: AC
Start: 2023-09-28 — End: 2023-10-05

## 2023-09-28 NOTE — Patient Instructions (Signed)
Likely viral or allergic trigger Recommend Flonase, antihistamine, supportive measures for a few more days.  If not making good progress can try oral prednisone burst. If ears become painful, can try antibiotic drops.  Continue supportive measures including rest, hydration, humidifier use, steam showers, warm compresses to sinuses, warm liquids with lemon and honey, and over-the-counter cough, cold, and analgesics as needed.  Follow-up if not improving.

## 2023-09-28 NOTE — Progress Notes (Signed)
Acute Office Visit  Subjective:     Patient ID: Tiffany Sharp, female    DOB: 11-Apr-1971, 52 y.o.   MRN: [id number]  Chief Complaint  Patient presents with   Ear Problem    HPI Patient is in today for ear pressure.   Discussed the use of AI scribe software for clinical note transcription with the patient, who gave verbal consent to proceed.  History of Present Illness   The patient presents with a chief complaint of sinus drainage and pressure, particularly in left ear. They report that this has been an issue for the past three days, during which they have been self-treating with Mucinex DM. The patient notes some temporary relief, but the pressure returns and has recently begun to manifest in the other ear as well. They have a history of ear infections around this time of year, and are concerned about a recurrence.  The patient denies any ringing in the ears or decreased hearing, but feels that an infection may be developing. They have also started to experience throat drainage and mild soreness, but deny any cough, chest congestion, or headache. They describe the sinus pressure as heavy and report feeling tired.  The patient has not yet begun to cough up phlegm and denies any fever. They have not knowingly been in contact with anyone who is sick. They have a history of seasonal allergies and have been using Flonase, but not regularly. They have started using it again two days ago.             ROS All review of systems negative except what is listed in the HPI      Objective:    BP 109/76   Pulse 72   Temp 98 F (36.7 C) (Oral)   Ht 5\' 11"  (1.803 m)   Wt 239 lb (108.4 kg)   SpO2 99%   BMI 33.33 kg/m    Physical Exam Vitals reviewed.  Constitutional:      Appearance: Normal appearance.  HENT:     Right Ear: A middle ear effusion is present. There is no impacted cerumen.     Left Ear: A middle ear effusion is present. There is no impacted cerumen.     Nose:  Congestion present.  Cardiovascular:     Rate and Rhythm: Normal rate and regular rhythm.     Heart sounds: Normal heart sounds.  Pulmonary:     Effort: Pulmonary effort is normal.     Breath sounds: Normal breath sounds.  Musculoskeletal:     Cervical back: No tenderness.  Lymphadenopathy:     Cervical: No cervical adenopathy.  Skin:    General: Skin is warm and dry.  Neurological:     Mental Status: She is alert and oriented to person, place, and time.  Psychiatric:        Mood and Affect: Mood normal.        Behavior: Behavior normal.        Thought Content: Thought content normal.        Judgment: Judgment normal.     No results found for any visits on 09/28/23.      Assessment & Plan:   Problem List Items Addressed This Visit   None Visit Diagnoses     Dysfunction of both eustachian tubes    -  Primary   Relevant Medications   predniSONE (DELTASONE) 20 MG tablet   ciprofloxacin-dexamethasone (CIPRODEX) OTIC suspension      Likely viral or  allergic trigger Recommend Flonase, antihistamine, supportive measures for a few more days.  If not making good progress can try oral prednisone burst. If ears become painful, can try antibiotic drops.  Continue supportive measures including rest, hydration, humidifier use, steam showers, warm compresses to sinuses, warm liquids with lemon and honey, and over-the-counter cough, cold, and analgesics as needed.  Follow-up if not improving.    Meds ordered this encounter  Medications   predniSONE (DELTASONE) 20 MG tablet    Sig: Take 1 tablet (20 mg total) by mouth daily with breakfast for 5 days.    Dispense:  5 tablet    Refill:  0    Order Specific Question:   Supervising Provider    Answer:   Danise Edge A [[id number]]   ciprofloxacin-dexamethasone (CIPRODEX) OTIC suspension    Sig: Place 4 drops into the left ear 2 (two) times daily for 7 days.    Dispense:  2.8 mL    Refill:  0    Order Specific Question:   Supervising  Provider    Answer:   Danise Edge A [[id number]]    Return if symptoms worsen or fail to improve.  Clayborne Dana, NP

## 2023-10-07 ENCOUNTER — Ambulatory Visit: Payer: BC Managed Care – PPO | Admitting: Cardiology

## 2023-10-17 ENCOUNTER — Other Ambulatory Visit: Payer: Self-pay | Admitting: Family

## 2023-10-17 DIAGNOSIS — F909 Attention-deficit hyperactivity disorder, unspecified type: Secondary | ICD-10-CM

## 2023-10-22 ENCOUNTER — Other Ambulatory Visit (HOSPITAL_BASED_OUTPATIENT_CLINIC_OR_DEPARTMENT_OTHER): Payer: Self-pay

## 2023-10-22 ENCOUNTER — Other Ambulatory Visit: Payer: Self-pay | Admitting: Family

## 2023-10-22 DIAGNOSIS — F909 Attention-deficit hyperactivity disorder, unspecified type: Secondary | ICD-10-CM

## 2023-10-22 MED ORDER — AMPHETAMINE-DEXTROAMPHETAMINE 5 MG PO TABS
5.0000 mg | ORAL_TABLET | Freq: Every day | ORAL | 0 refills | Status: DC
  Filled 2023-10-22: qty 30, 30d supply, fill #0

## 2023-11-29 ENCOUNTER — Other Ambulatory Visit: Payer: Self-pay | Admitting: Family

## 2023-11-29 ENCOUNTER — Other Ambulatory Visit (HOSPITAL_BASED_OUTPATIENT_CLINIC_OR_DEPARTMENT_OTHER): Payer: Self-pay

## 2023-11-29 DIAGNOSIS — F909 Attention-deficit hyperactivity disorder, unspecified type: Secondary | ICD-10-CM

## 2023-11-29 MED ORDER — AMPHETAMINE-DEXTROAMPHETAMINE 5 MG PO TABS
5.0000 mg | ORAL_TABLET | Freq: Every day | ORAL | 0 refills | Status: DC
  Filled 2023-11-29: qty 30, 30d supply, fill #0

## 2023-11-29 NOTE — Telephone Encounter (Signed)
Requesting: Adderall 5mg   Contract: 04/16/23 UDS: 11/26/21 Last Visit: 07/26/23 Next Visit: None Last Refill: 10/22/23 #30 and 0RF   Please Advise

## 2023-11-30 ENCOUNTER — Other Ambulatory Visit (HOSPITAL_BASED_OUTPATIENT_CLINIC_OR_DEPARTMENT_OTHER): Payer: Self-pay

## 2023-12-06 ENCOUNTER — Encounter: Payer: Self-pay | Admitting: Family

## 2023-12-06 ENCOUNTER — Encounter (HOSPITAL_BASED_OUTPATIENT_CLINIC_OR_DEPARTMENT_OTHER): Payer: Self-pay

## 2023-12-06 ENCOUNTER — Other Ambulatory Visit: Payer: Self-pay | Admitting: Family

## 2023-12-06 ENCOUNTER — Other Ambulatory Visit (HOSPITAL_BASED_OUTPATIENT_CLINIC_OR_DEPARTMENT_OTHER): Payer: Self-pay

## 2023-12-06 DIAGNOSIS — F909 Attention-deficit hyperactivity disorder, unspecified type: Secondary | ICD-10-CM

## 2023-12-06 MED ORDER — AMPHETAMINE-DEXTROAMPHETAMINE 5 MG PO TABS: 5.0000 mg | ORAL_TABLET | Freq: Every day | ORAL | 0 refills | Status: DC

## 2023-12-06 MED ORDER — AMPHETAMINE-DEXTROAMPHET ER 20 MG PO CP24: 20.0000 mg | ORAL_CAPSULE | ORAL | 0 refills | Status: DC

## 2023-12-06 NOTE — Telephone Encounter (Signed)
Requesting: Adderall XR 20mg  and Adderall 5mg   Contract: 04/16/23 UDS: 11/26/21 Last Visit: 07/26/23 Next Visit: None Last Refill: see med list   Please Advise

## 2023-12-31 ENCOUNTER — Ambulatory Visit: Payer: BC Managed Care – PPO | Admitting: Family

## 2023-12-31 VITALS — BP 138/79 | HR 87 | Temp 98.2°F | Resp 18 | Ht 70.5 in | Wt 232.0 lb

## 2023-12-31 DIAGNOSIS — F909 Attention-deficit hyperactivity disorder, unspecified type: Secondary | ICD-10-CM | POA: Diagnosis not present

## 2023-12-31 DIAGNOSIS — E559 Vitamin D deficiency, unspecified: Secondary | ICD-10-CM | POA: Diagnosis not present

## 2023-12-31 DIAGNOSIS — Z1159 Encounter for screening for other viral diseases: Secondary | ICD-10-CM | POA: Diagnosis not present

## 2023-12-31 MED ORDER — AMPHETAMINE-DEXTROAMPHET ER 30 MG PO CP24: 30.0000 mg | ORAL_CAPSULE | ORAL | 0 refills | Status: DC

## 2023-12-31 NOTE — Progress Notes (Signed)
 Subjective:     Patient ID: Tiffany Sharp, female    DOB: [birth year], 53 y.o.   MRN: [id number]  Chief Complaint  Patient presents with   medication follow up    Concerns/ questions:      HPI  Discussed the use of AI scribe software for clinical note transcription with the patient, who gave verbal consent to proceed.  History of Present Illness  Tiffany Sharp is a 53 year old female who presents for medication management and routine follow-up.  She is managing ADHD with Adderall XR 20 mg once daily. She has not been taking the additional 5 mg in the afternoon as it did not make a noticeable difference. Adderall has reduced her need for excessive coffee consumption, which is currently up to three cups a day. She sometimes experiences trouble sleeping, attributing it to either caffeine or menopausal symptoms, and occasionally takes melatonin to aid sleep. She mentions that Vyvanse was smoother and helped with appetite suppression, which was beneficial for weight management. Vyvanse was cost prohibitive. She finds herself eating to stay awake to avoid drinking more coffee, which she feels is detrimental.  She has a history of orthopedic issues for which she takes meloxicam. After stopping it for the past seven days, she noticed increased stiffness, indicating the medication's effectiveness in managing her symptoms. She is concerned about the impact of meloxicam on her liver and kidneys.  She reports experiencing some congestion, which she suspects might be related to heartburn and is considering restarting her previous medication for it.  Her last mammogram was likely in January, and she acknowledges being off schedule. She plans to follow up with [facility] for her mammogram and Pap smear, which she believes is also due.     Health Maintenance Due  Topic Date Due   Hepatitis C Screening  Never done   Cervical Cancer Screening (HPV/Pap Cotest)  02/23/2020   MAMMOGRAM   12/02/2022   COVID-19 Vaccine (3 - 2024-25 season) 07/04/2023   DTaP/Tdap/Td (2 - Td or Tdap) 09/03/2023    Past Medical History:  Diagnosis Date   Abscess    Atrial septal aneurysm    Breast asymmetry in female 10/12/2012   Right breast on December 2013 mammogram and ultrasound Followup ultrasound in June 2014   Breast lump    had biopsy, benign   Breast lump 08/25/2013   R breast, seen at [facility] 09/2012, likely glandular tissue.  Recommended f/u Mg in 6 months (this was prior to pregnancy)   Depression    PPD - post partum depression   Eclampsia    Endometritis    HELLP (hemolytic anemia/elev liver enzymes/low platelets in pregnancy)    History of echocardiogram    Echo 2/17: EF 60-65%, normal wall motion, mild RVE, atrial septal aneurysm (findings consistent with increased left atrial pressure)   History of toxic shock syndrome 08/25/2013   HSV-2 (herpes simplex virus 2) infection    IBS (irritable bowel syndrome)    Knee instability    Murmur    told this as a child - no specifict testing ever done   Post partum depression    Postpartum hemorrhage    Pregnancy induced hypertension    no meds outside of pregnancy   Preterm delivery 09/02/2013   Rubella non-immune status, antepartum 08/25/2013   Toxic shock syndrome (HCC) 1997   started with an abscess right axilla    Past Surgical History:  Procedure Laterality Date   BREAST  BIOPSY  2013   benign   DILATION AND CURETTAGE OF UTERUS     DILATION AND EVACUATION N/A 09/01/2013   Procedure: DILATATION AND EVACUATION;  Surgeon: Purcell Nails, MD;  Location: [facility];  Service: Gynecology;  Laterality: N/A;   INCISE AND DRAIN ABCESS  1996   under left arm    Family History  Problem Relation Age of Onset   Cancer Mother        rare form of melanoma (hands)   Hypertension Father    Heart disease Father        chf, no CAD   Transient ischemic attack Father    Bipolar disorder Sister    Obesity Sister     Asthma Brother    Depression Brother    Obesity Brother    Depression Maternal Grandmother        agoraphobia   Stomach cancer Maternal Grandfather    Alzheimer's disease Maternal Grandfather    Hypertension Paternal Grandmother    Hypertension Paternal Grandfather    Heart attack Neg Hx    Colon polyps Neg Hx    Colon cancer Neg Hx    Esophageal cancer Neg Hx    Rectal cancer Neg Hx     Social History   Socioeconomic History   Marital status: Married    Spouse name: Not on file   Number of children: Not on file   Years of education: Not on file   Highest education level: Bachelor's degree (e.g., BA, AB, BS)  Occupational History   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  Tobacco Use   Smoking status: Never   Smokeless tobacco: Never  Vaping Use   Vaping status: Never Used  Substance and Sexual Activity   Alcohol use: No   Drug use: No   Sexual activity: Not Currently    Birth control/protection: None  Other Topics Concern   Not on file  Social History Narrative   Married   5 daughters:   2000   2002   2004   2008   2014   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; 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   Social Drivers of Corporate investment banker Strain: Low Risk  (03/08/2023)   Overall Financial Resource Strain (CARDIA)    Difficulty of Paying Living Expenses: Not very hard  Food Insecurity: No Food Insecurity (03/08/2023)   Hunger Vital Sign    Worried About Running Out of Food in the Last Year: Never true    Ran Out of Food in the Last Year: Never true  Transportation Needs: No Transportation Needs (03/08/2023)   PRAPARE - Administrator, Civil Service (Medical): No    Lack of Transportation (Non-Medical): No  Physical Activity: Unknown (03/08/2023)   Exercise Vital Sign    Days of Exercise per Week: Patient declined    Minutes of Exercise per Session: Not on file  Stress: No Stress Concern Present (03/08/2023)   [employer] of Occupational Health - Occupational Stress Questionnaire    Feeling of Stress : Only a little  Social Connections: Unknown (03/08/2023)   Social Connection and Isolation Panel [NHANES]    Frequency of Communication with Friends and Family: Twice a week    Frequency of Social Gatherings with Friends and Family: Patient declined    Attends Religious Services: More than 4 times per year    Active Member of [employer] or Organizations: Yes    Attends Club or  Organization Meetings: 1 to 4 times per year    Marital Status: Married  Catering manager Violence: Not on file    Outpatient Medications Prior to Visit  Medication Sig Dispense Refill   Cyanocobalamin (B-12 PO) Take 1 each by mouth daily. Mix in drink     DIGESTIVE ENZYMES PO Take 1 tablet by mouth daily.     docusate sodium (COLACE) 100 MG capsule Take 100 mg by mouth daily.     Multiple Vitamins-Minerals (MULTIVITAMIN WITH MINERALS) tablet Take 1 tablet by mouth daily.     amphetamine-dextroamphetamine (ADDERALL XR) 20 MG 24 hr capsule Take 1 capsule (20 mg total) by mouth every morning. 30 capsule 0   amphetamine-dextroamphetamine (ADDERALL) 5 MG tablet Take 1 tablet (5 mg total) by mouth daily in the afternoon. (Patient not taking: Reported on 12/31/2023) 30 tablet 0   gabapentin (NEURONTIN) 300 MG capsule TAKE 1 CAPSULE(300 MG) BY MOUTH AT BEDTIME (Patient not taking: Reported on 12/31/2023) 90 capsule 1   pantoprazole (PROTONIX) 40 MG tablet Take 1 tablet (40 mg total) by mouth daily. (Patient not taking: Reported on 12/31/2023) 90 tablet 0   No facility-administered medications prior to visit.    Allergies  Allergen Reactions   Penicillins Hives   Sulfa Antibiotics Hives    ROS    See HPI Objective:    Physical Exam Constitutional:      Appearance: Normal appearance.  Cardiovascular:     Rate and Rhythm: Normal rate.  Pulmonary:     Effort: Pulmonary effort is normal.  Neurological:     Mental Status: She is  alert.  Psychiatric:        Mood and Affect: Mood normal.        Behavior: Behavior normal.        Thought Content: Thought content normal.        Judgment: Judgment normal.      BP 138/79 (BP Location: Left Arm, Patient Position: Sitting, Cuff Size: Large)   Pulse 87   Temp 98.2 F (36.8 C) (Temporal)   Resp 18   Ht 5' 10.5" (1.791 m)   Wt 232 lb (105.2 kg)   SpO2 98%   BMI 32.82 kg/m  Wt Readings from Last 3 Encounters:  12/31/23 232 lb (105.2 kg)  09/28/23 239 lb (108.4 kg)  07/26/23 236 lb (107 kg)       Assessment & Plan:   Problem List Items Addressed This Visit       Unprioritized   ADHD (attention deficit hyperactivity disorder) - Primary    Patient reports Adderall 20mg  daily is helpful but not as effective as previous Vyvanse. No significant side effects reported. -Increase Adderall XR to 30mg  daily. -Check urine drug screen today for compliance. Controlled substance contract is updated.       Relevant Medications   amphetamine-dextroamphetamine (ADDERALL XR) 30 MG 24 hr capsule   Other Relevant Orders   Basic Metabolic Panel (BMET)   Drug Monitoring Panel [phone] , Urine   Other Visit Diagnoses       Encounter for hepatitis C screening test for low risk patient       Relevant Orders   Hepatitis C Antibody     Vitamin D deficiency       Relevant Orders   VITAMIN D 25 Hydroxy (Vit-D Deficiency, Fractures)       I have discontinued Tiffany Sharp pantoprazole, gabapentin, amphetamine-dextroamphetamine, and amphetamine-dextroamphetamine. I am also having her start on amphetamine-dextroamphetamine. Additionally, I am  having her maintain her DIGESTIVE ENZYMES PO, docusate sodium, Cyanocobalamin (B-12 PO), and multivitamin with minerals.  Meds ordered this encounter  Medications   amphetamine-dextroamphetamine (ADDERALL XR) 30 MG 24 hr capsule    Sig: Take 1 capsule (30 mg total) by mouth every morning.    Dispense:  30 capsule    Refill:  0     Supervising Provider:   Danise Edge A [[id number]]

## 2023-12-31 NOTE — Assessment & Plan Note (Signed)
  Patient reports Adderall 20mg  daily is helpful but not as effective as previous Vyvanse. No significant side effects reported. -Increase Adderall XR to 30mg  daily. -Check urine drug screen today for compliance. Controlled substance contract is updated.

## 2024-01-01 LAB — BASIC METABOLIC PANEL
BUN: 12 mg/dL (ref 7–25)
CO2: 27 mmol/L (ref 20–32)
Calcium: 9.3 mg/dL (ref 8.6–10.4)
Chloride: 103 mmol/L (ref 98–110)
Creat: 0.9 mg/dL (ref 0.50–1.03)
Glucose, Bld: 86 mg/dL (ref 65–99)
Potassium: 4.4 mmol/L (ref 3.5–5.3)
Sodium: 140 mmol/L (ref 135–146)

## 2024-01-01 LAB — HEPATITIS C ANTIBODY: Hepatitis C Ab: NONREACTIVE

## 2024-01-01 LAB — VITAMIN D 25 HYDROXY (VIT D DEFICIENCY, FRACTURES): Vit D, 25-Hydroxy: 24 ng/mL — ABNORMAL LOW (ref 30–100)

## 2024-01-02 ENCOUNTER — Telehealth: Payer: Self-pay | Admitting: Family

## 2024-01-02 DIAGNOSIS — E559 Vitamin D deficiency, unspecified: Secondary | ICD-10-CM

## 2024-01-02 LAB — DRUG MONITORING PANEL 376104, URINE
Amphetamine: 1242 ng/mL — ABNORMAL HIGH (ref <250)
Amphetamines: POSITIVE ng/mL — AB (ref <500)
Barbiturates: NEGATIVE ng/mL (ref <300)
Benzodiazepines: NEGATIVE ng/mL (ref <100)
Cocaine Metabolite: NEGATIVE ng/mL (ref <150)
Desmethyltramadol: NEGATIVE ng/mL (ref <100)
Methamphetamine: NEGATIVE ng/mL (ref <250)
Opiates: NEGATIVE ng/mL (ref <100)
Oxycodone: NEGATIVE ng/mL (ref <100)
Tramadol: NEGATIVE ng/mL (ref <100)

## 2024-01-02 LAB — DM TEMPLATE

## 2024-01-02 MED ORDER — VITAMIN D (ERGOCALCIFEROL) 1.25 MG (50000 UNIT) PO CAPS: 50000.0000 [IU] | ORAL_CAPSULE | ORAL | 0 refills | Status: AC

## 2024-01-02 MED ORDER — VITAMIN D (ERGOCALCIFEROL) 1.25 MG (50000 UNIT) PO CAPS: 50000.0000 [IU] | ORAL_CAPSULE | ORAL | 0 refills | Status: DC

## 2024-01-02 NOTE — Telephone Encounter (Signed)
 Vitamin D level is low.  Advise patient to begin vit D 50000 units once weekly for 12 weeks, then repeat vit D level (dx Vit D deficiency).    Hep C screening is negative. Sugar and kidney function are normal.

## 2024-01-04 ENCOUNTER — Telehealth: Payer: Self-pay | Admitting: Neurology

## 2024-01-04 NOTE — Telephone Encounter (Signed)
 Called patient but no answer, left voice mail for patient to call back.   Ok to Archivist

## 2024-01-04 NOTE — Telephone Encounter (Signed)
 Patient notified of results and new prescription. She will call back to set up 12 weeks recheck

## 2024-01-04 NOTE — Telephone Encounter (Signed)
Talked to patient yesterday.

## 2024-01-04 NOTE — Telephone Encounter (Signed)
 Copied from CRM [id number]. Topic: General - Other >> Jan 04, 2024  9:27 AM Truddie Crumble wrote: Reason for CRM: patient returning a call to Windell Moulding at the office

## 2024-02-07 ENCOUNTER — Other Ambulatory Visit: Payer: Self-pay | Admitting: Family

## 2024-02-07 DIAGNOSIS — F909 Attention-deficit hyperactivity disorder, unspecified type: Secondary | ICD-10-CM

## 2024-02-07 MED ORDER — AMPHETAMINE-DEXTROAMPHET ER 30 MG PO CP24: 30.0000 mg | ORAL_CAPSULE | ORAL | 0 refills | Status: DC

## 2024-02-07 MED ORDER — MULTI-VITAMIN/MINERALS PO TABS: 1.0000 | ORAL_TABLET | Freq: Every day | ORAL | Status: AC

## 2024-02-07 NOTE — Telephone Encounter (Signed)
 Requesting: Adderall XR 30 mg Contract: 12/31/2023 UDS: 12/31/2023 Last Visit: 12/31/2023 Next Visit: N/A Last Refill: 12/31/2023  Please Advise

## 2024-02-14 DIAGNOSIS — M7661 Achilles tendinitis, right leg: Secondary | ICD-10-CM | POA: Diagnosis not present

## 2024-02-14 DIAGNOSIS — M25551 Pain in right hip: Secondary | ICD-10-CM | POA: Diagnosis not present

## 2024-03-09 ENCOUNTER — Other Ambulatory Visit: Payer: Self-pay | Admitting: Family

## 2024-03-09 DIAGNOSIS — F909 Attention-deficit hyperactivity disorder, unspecified type: Secondary | ICD-10-CM

## 2024-03-09 MED ORDER — AMPHETAMINE-DEXTROAMPHET ER 30 MG PO CP24
30.0000 mg | ORAL_CAPSULE | ORAL | 0 refills | Status: DC
Start: 2024-03-09 — End: 2024-03-14

## 2024-03-09 NOTE — Telephone Encounter (Signed)
 Requesting: Adderall XR 30mg   Contract: 12/31/23 UDS: 12/31/23 Last Visit: 12/31/23 Next Visit: None Last Refill: 02/07/24 #30 and 0RF   Please Advise

## 2024-03-14 ENCOUNTER — Other Ambulatory Visit (HOSPITAL_BASED_OUTPATIENT_CLINIC_OR_DEPARTMENT_OTHER): Payer: Self-pay

## 2024-03-14 ENCOUNTER — Other Ambulatory Visit: Payer: Self-pay | Admitting: Family

## 2024-03-14 DIAGNOSIS — F909 Attention-deficit hyperactivity disorder, unspecified type: Secondary | ICD-10-CM

## 2024-03-14 MED ORDER — AMPHETAMINE-DEXTROAMPHET ER 30 MG PO CP24
30.0000 mg | ORAL_CAPSULE | ORAL | 0 refills | Status: AC
Start: 2024-03-14 — End: ?
  Filled 2024-03-14 – 2024-03-15 (×2): qty 30, 30d supply, fill #0

## 2024-03-15 ENCOUNTER — Other Ambulatory Visit (HOSPITAL_BASED_OUTPATIENT_CLINIC_OR_DEPARTMENT_OTHER): Payer: Self-pay

## 2024-03-16 ENCOUNTER — Other Ambulatory Visit (HOSPITAL_BASED_OUTPATIENT_CLINIC_OR_DEPARTMENT_OTHER): Payer: Self-pay

## 2024-03-16 DIAGNOSIS — Z1231 Encounter for screening mammogram for malignant neoplasm of breast: Secondary | ICD-10-CM | POA: Diagnosis not present

## 2024-03-16 DIAGNOSIS — N951 Menopausal and female climacteric states: Secondary | ICD-10-CM | POA: Diagnosis not present

## 2024-03-16 DIAGNOSIS — Z01419 Encounter for gynecological examination (general) (routine) without abnormal findings: Secondary | ICD-10-CM | POA: Diagnosis not present

## 2024-04-17 ENCOUNTER — Other Ambulatory Visit: Payer: Self-pay | Admitting: Family

## 2024-04-17 DIAGNOSIS — F909 Attention-deficit hyperactivity disorder, unspecified type: Secondary | ICD-10-CM

## 2024-04-18 ENCOUNTER — Other Ambulatory Visit (HOSPITAL_BASED_OUTPATIENT_CLINIC_OR_DEPARTMENT_OTHER): Payer: Self-pay

## 2024-04-18 MED ORDER — AMPHETAMINE-DEXTROAMPHET ER 30 MG PO CP24
30.0000 mg | ORAL_CAPSULE | ORAL | 0 refills | Status: DC
  Filled 2024-04-18: qty 30, 30d supply, fill #0

## 2024-04-18 NOTE — Telephone Encounter (Signed)
 Requesting: Adderall XR 30 mg Contract: 12/31/2023 UDS: 12/31/2023 Last Visit: 12/31/2023 Next Visit: N/A Last Refill: 03/14/2024  Please Advise

## 2024-05-17 ENCOUNTER — Encounter: Payer: Self-pay | Admitting: Family

## 2024-05-17 ENCOUNTER — Other Ambulatory Visit: Payer: Self-pay | Admitting: Family

## 2024-05-17 DIAGNOSIS — F909 Attention-deficit hyperactivity disorder, unspecified type: Secondary | ICD-10-CM

## 2024-05-17 NOTE — Telephone Encounter (Signed)
 Requesting: Adderall XR 30mg   Contract: 12/31/23 UDS: 12/31/23 Last Visit: 12/31/23 Next Visit: None Last Refill: 04/18/24 #30 and 0RF  Please Advise

## 2024-05-18 ENCOUNTER — Other Ambulatory Visit (HOSPITAL_BASED_OUTPATIENT_CLINIC_OR_DEPARTMENT_OTHER): Payer: Self-pay

## 2024-05-18 MED ORDER — AMPHETAMINE-DEXTROAMPHET ER 30 MG PO CP24
30.0000 mg | ORAL_CAPSULE | ORAL | 0 refills | Status: DC
Start: 2024-05-18 — End: 2024-06-21
  Filled 2024-05-18: qty 30, 30d supply, fill #0

## 2024-05-26 DIAGNOSIS — M170 Bilateral primary osteoarthritis of knee: Secondary | ICD-10-CM | POA: Diagnosis not present

## 2024-05-30 ENCOUNTER — Encounter: Payer: Self-pay | Admitting: Family

## 2024-05-30 ENCOUNTER — Ambulatory Visit: Admitting: Family

## 2024-05-30 VITALS — BP 129/83 | HR 98 | Temp 98.7°F | Resp 16

## 2024-05-30 DIAGNOSIS — F909 Attention-deficit hyperactivity disorder, unspecified type: Secondary | ICD-10-CM

## 2024-05-30 DIAGNOSIS — H6992 Unspecified Eustachian tube disorder, left ear: Secondary | ICD-10-CM | POA: Diagnosis not present

## 2024-05-30 DIAGNOSIS — R011 Cardiac murmur, unspecified: Secondary | ICD-10-CM | POA: Diagnosis not present

## 2024-05-30 MED ORDER — LEVOCETIRIZINE DIHYDROCHLORIDE 5 MG PO TABS: 5.0000 mg | ORAL_TABLET | Freq: Every evening | ORAL | Status: AC

## 2024-05-30 MED ORDER — FLUTICASONE PROPIONATE 50 MCG/ACT NA SUSP: 2.0000 | Freq: Every day | NASAL | Status: AC

## 2024-05-30 NOTE — Assessment & Plan Note (Signed)
 Add flonase  to xyzal . Call if symptoms worsen or fail to improve in the next few weeks.

## 2024-05-30 NOTE — Assessment & Plan Note (Signed)
 Stable on adderall xr. Updated contract and UDS today.

## 2024-05-30 NOTE — Patient Instructions (Addendum)
 VISIT SUMMARY:  Today, we discussed your intermittent ear pressure and your ADHD management. We made some adjustments to your treatment plan to help manage your symptoms more effectively.  YOUR PLAN:  EUSTACHIAN TUBE DYSFUNCTION: You have intermittent ear pressure likely due to fluid buildup without an active infection. -Add flonase  2 sprays each nostril once daily to your regimen of daily xyzal . -Continue using decongestant products as needed. -Return if your symptoms get worse.  ATTENTION-DEFICIT HYPERACTIVITY DISORDER (ADHD): Your ADHD is well-managed with your current medication. -we updated your contract today and your urine drug screen. -Continue taking Adderall as prescribed.

## 2024-05-30 NOTE — Progress Notes (Signed)
 Subjective:     Patient ID: Tiffany Sharp, female    DOB: Oct 08, 1971, 53 y.o.   MRN: [id number]  Chief Complaint  Patient presents with   Ear Pain    Patient complains of left ear pressure     HPI  Discussed the use of AI scribe software for clinical note transcription with the patient, who gave verbal consent to proceed.  History of Present Illness  Tiffany Sharp is a 53 year old female who presents with symptoms suggestive of an ear infection.  She experiences intermittent sensations of an ear infection, which subside with the use of over-the-counter decongestants like Mucinex D.  She has had two previous episodes of ear infections as an adult, with severe pain. There is no nasal drainage or sinus pressure. She takes Xyzal  daily, which she finds effective, and increases her intake of hot teas when symptoms arise.  She continues adderall xr and feels that it is very helpful keeping her focused at her job.    Health Maintenance Due  Topic Date Due   Hepatitis B Vaccines (1 of 3 - 19+ 3-dose series) Never done   COVID-19 Vaccine (3 - 2024-25 season) 07/04/2023   DTaP/Tdap/Td (2 - Td or Tdap) 09/03/2023    Past Medical History:  Diagnosis Date   Abscess    Atrial septal aneurysm    Breast asymmetry in female 10/12/2012   Right breast on December 2013 mammogram and ultrasound Followup ultrasound in June 2014   Breast lump    had biopsy, benign   Breast lump 08/25/2013   R breast, seen at [facility] 09/2012, likely glandular tissue.  Recommended f/u Mg in 6 months (this was prior to pregnancy)   Depression    PPD - post partum depression   Eclampsia    Endometritis    HELLP (hemolytic anemia/elev liver enzymes/low platelets in pregnancy)    HELLP syndrome 07/13/2023   Postpartum 2004.     History of echocardiogram    Echo 2/17: EF 60-65%, normal wall motion, mild RVE, atrial septal aneurysm (findings consistent with increased left atrial pressure)   History  of eclampsia 08/25/2013   2004,  pp day #5; received Mag     History of loop electrical excision procedure (LEEP) 08/25/2013   History of postpartum hemorrhage 07/13/2023   Retained placenta     History of toxic shock syndrome 08/25/2013   HSV-2 (herpes simplex virus 2) infection    IBS (irritable bowel syndrome)    Knee instability    Murmur    told this as a child - no specifict testing ever done   Post partum depression    Postpartum hemorrhage    Pregnancy induced hypertension    no meds outside of pregnancy   Preterm delivery 09/02/2013   Rubella non-immune status, antepartum 08/25/2013   Toxic shock syndrome (HCC) 1997   started with an abscess right axilla    Past Surgical History:  Procedure Laterality Date   BREAST BIOPSY  2013   benign   DILATION AND CURETTAGE OF UTERUS     DILATION AND EVACUATION N/A 09/01/2013   Procedure: DILATATION AND EVACUATION;  Surgeon: Jon CINDERELLA Rummer, MD;  Location: [facility];  Service: Gynecology;  Laterality: N/A;   INCISE AND DRAIN ABCESS  1996   under left arm    Family History  Problem Relation Age of Onset   Cancer Mother        rare form of melanoma (hands)  Hypertension Father    Heart disease Father        chf, no CAD   Transient ischemic attack Father    Bipolar disorder Sister    Obesity Sister    Asthma Brother    Depression Brother    Obesity Brother    Depression Maternal Grandmother        agoraphobia   Stomach cancer Maternal Grandfather    Alzheimer's disease Maternal Grandfather    Hypertension Paternal Grandmother    Hypertension Paternal Grandfather    Heart attack Neg Hx    Colon polyps Neg Hx    Colon cancer Neg Hx    Esophageal cancer Neg Hx    Rectal cancer Neg Hx     Social History   Socioeconomic History   Marital status: Married    Spouse name: Not on file   Number of children: Not on file   Years of education: Not on file   Highest education level: Bachelor's degree (e.g., BA, AB, BS)   Occupational History   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  Tobacco Use   Smoking status: Never   Smokeless tobacco: Never  Vaping Use   Vaping status: Never Used  Substance and Sexual Activity   Alcohol use: No   Drug use: No   Sexual activity: Not Currently    Birth control/protection: None  Other Topics Concern   Not on file  Social History Narrative   Married   5 daughters:   2000   2002   2004   2008   2014   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; 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   Social Drivers of Corporate investment banker Strain: Low Risk  (03/08/2023)   Overall Financial Resource Strain (CARDIA)    Difficulty of Paying Living Expenses: Not very hard  Food Insecurity: No Food Insecurity (03/08/2023)   Hunger Vital Sign    Worried About Running Out of Food in the Last Year: Never true    Ran Out of Food in the Last Year: Never true  Transportation Needs: No Transportation Needs (03/08/2023)   PRAPARE - Administrator, Civil Service (Medical): No    Lack of Transportation (Non-Medical): No  Physical Activity: Unknown (03/08/2023)   Exercise Vital Sign    Days of Exercise per Week: Patient declined    Minutes of Exercise per Session: Not on file  Stress: No Stress Concern Present (03/08/2023)   [employer] of Occupational Health - Occupational Stress Questionnaire    Feeling of Stress : Only a little  Social Connections: Unknown (03/08/2023)   Social Connection and Isolation Panel    Frequency of Communication with Friends and Family: Twice a week    Frequency of Social Gatherings with Friends and Family: Patient declined    Attends Religious Services: More than 4 times per year    Active Member of [employer] or Organizations: Yes    Attends Banker Meetings: 1 to 4 times per year    Marital Status: Married  Catering manager Violence: Not on file    Outpatient Medications Prior to Visit  Medication Sig  Dispense Refill   amphetamine -dextroamphetamine  (ADDERALL XR) 30 MG 24 hr capsule Take 1 capsule (30 mg total) by mouth every morning. 30 capsule 0   Cyanocobalamin (B-12 PO) Take 1 each by mouth daily. Mix in drink     DIGESTIVE ENZYMES PO Take 1 tablet by mouth daily.  docusate sodium  (COLACE) 100 MG capsule Take 100 mg by mouth daily.     Multiple Vitamins-Minerals (MULTIVITAMIN WITH MINERALS) tablet Take 1 tablet by mouth daily.     Vitamin D , Ergocalciferol , (DRISDOL ) 1.25 MG (50000 UNIT) CAPS capsule Take 1 capsule (50,000 Units total) by mouth every 7 (seven) days. 12 capsule 0   No facility-administered medications prior to visit.    Allergies  Allergen Reactions   Penicillins Hives   Sulfa Antibiotics Hives    ROS See HPI    Objective:    Physical Exam Constitutional:      General: She is not in acute distress.    Appearance: Normal appearance. She is well-developed.  HENT:     Head: Normocephalic and atraumatic.     Right Ear: Tympanic membrane, ear canal and external ear normal.     Left Ear: Ear canal and external ear normal.     Ears:     Comments: Left serous effusion without erythema Eyes:     General: No scleral icterus. Neck:     Thyroid : No thyromegaly.  Cardiovascular:     Rate and Rhythm: Normal rate.  Pulmonary:     Effort: Pulmonary effort is normal.  Musculoskeletal:     Cervical back: Neck supple.  Skin:    General: Skin is warm and dry.  Neurological:     Mental Status: She is alert and oriented to person, place, and time.  Psychiatric:        Mood and Affect: Mood normal.        Behavior: Behavior normal.        Thought Content: Thought content normal.        Judgment: Judgment normal.      BP 129/83 (BP Location: Right Arm, Patient Position: Sitting, Cuff Size: Large)   Pulse 98   Temp 98.7 F (37.1 C) (Oral)   Resp 16   SpO2 99%  Wt Readings from Last 3 Encounters:  12/31/23 232 lb (105.2 kg)  09/28/23 239 lb (108.4 kg)   07/26/23 236 lb (107 kg)       Assessment & Plan:   Problem List Items Addressed This Visit       Unprioritized   Murmur, cardiac   2 D echo 2024 noted severely aneurysmal interatrial septum. She was referred to cardiology and cardiac CT was performed that noted calcium  score of zero and Interatrial septum aneurysm with bulging from left to right, consistent with PFO. I will reach out to her cardiologist to see if he wishes to bring her back in for follow up.       Dysfunction of left eustachian tube - Primary   Add flonase  to xyzal . Call if symptoms worsen or fail to improve in the next few weeks.       Relevant Medications   fluticasone  (FLONASE ) 50 MCG/ACT nasal spray   levocetirizine (XYZAL  ALLERGY 24HR) 5 MG tablet   ADHD (attention deficit hyperactivity disorder)   Stable on adderall xr. Updated contract and UDS today.      Relevant Orders   DRUG MONITORING, PANEL 8 WITH CONFIRMATION, URINE    I am having Tiffany Sharp start on fluticasone  and levocetirizine. I am also having her maintain her DIGESTIVE ENZYMES PO, docusate sodium , Cyanocobalamin (B-12 PO), Vitamin D  (Ergocalciferol ), multivitamin with minerals, and amphetamine -dextroamphetamine .  Meds ordered this encounter  Medications   fluticasone  (FLONASE ) 50 MCG/ACT nasal spray    Sig: Place 2 sprays into both nostrils daily.  Supervising Provider:   DOMENICA BLACKBIRD A [[id number]]   levocetirizine (XYZAL  ALLERGY 24HR) 5 MG tablet    Sig: Take 1 tablet (5 mg total) by mouth every evening.    Supervising Provider:   DOMENICA BLACKBIRD A [phone]

## 2024-05-30 NOTE — Assessment & Plan Note (Addendum)
 2 D echo 2024 noted severely aneurysmal interatrial septum. She was referred to cardiology and cardiac CT was performed that noted calcium  score of zero and Interatrial septum aneurysm with bulging from left to right, consistent with PFO. I will reach out to her cardiologist to see if he wishes to bring her back in for follow up.

## 2024-05-31 ENCOUNTER — Other Ambulatory Visit: Payer: Self-pay | Admitting: Family

## 2024-06-02 ENCOUNTER — Telehealth: Payer: Self-pay | Admitting: Family

## 2024-06-02 LAB — DRUG MONITORING, PANEL 8 WITH CONFIRMATION, URINE
6 Acetylmorphine: NEGATIVE ng/mL (ref <10)
Alcohol Metabolites: NEGATIVE ng/mL (ref <500)
Amphetamine: 2574 ng/mL — ABNORMAL HIGH (ref <250)
Amphetamines: POSITIVE ng/mL — AB (ref <500)
Benzodiazepines: NEGATIVE ng/mL (ref <100)
Buprenorphine, Urine: NEGATIVE ng/mL (ref <5)
Cocaine Metabolite: NEGATIVE ng/mL (ref <150)
Creatinine: 127.1 mg/dL (ref >=20.0)
MDMA: NEGATIVE ng/mL (ref <500)
Marijuana Metabolite: NEGATIVE ng/mL (ref <20)
Methamphetamine: NEGATIVE ng/mL (ref <250)
Opiates: NEGATIVE ng/mL (ref <100)
Oxidant: NEGATIVE ug/mL (ref <200)
Oxycodone: NEGATIVE ng/mL (ref <100)
pH: 8 (ref 4.5–9.0)

## 2024-06-02 LAB — DM TEMPLATE

## 2024-06-02 NOTE — Telephone Encounter (Signed)
-----   Message from Lamar Fitch sent at 06/02/2024 12:35 PM EDT ----- Can schedule her to have follow-up but honestly will not going to do much about that ----- Message ----- From: Daryl Setter, NP Sent: 05/30/2024   5:11 PM EDT To: Lamar JINNY Fitch, MD  Hello,  I saw Ms. Biscoe today and I was reviewing her cardiac CT results from last year.  I noted ? Of PFO on cardiac CT.  Do you want to see her back for follow up on this?   Thanks,  Holland Nickson

## 2024-06-06 ENCOUNTER — Encounter: Payer: Self-pay | Admitting: Family

## 2024-06-06 DIAGNOSIS — N3281 Overactive bladder: Secondary | ICD-10-CM

## 2024-06-06 MED ORDER — OXYBUTYNIN CHLORIDE ER 5 MG PO TB24: 5.0000 mg | ORAL_TABLET | Freq: Every day | ORAL | 0 refills | Status: DC

## 2024-06-12 DIAGNOSIS — M79671 Pain in right foot: Secondary | ICD-10-CM | POA: Diagnosis not present

## 2024-06-12 DIAGNOSIS — M79672 Pain in left foot: Secondary | ICD-10-CM | POA: Diagnosis not present

## 2024-06-12 DIAGNOSIS — M7661 Achilles tendinitis, right leg: Secondary | ICD-10-CM | POA: Diagnosis not present

## 2024-06-12 DIAGNOSIS — M7662 Achilles tendinitis, left leg: Secondary | ICD-10-CM | POA: Diagnosis not present

## 2024-06-12 DIAGNOSIS — M722 Plantar fascial fibromatosis: Secondary | ICD-10-CM | POA: Diagnosis not present

## 2024-06-16 DIAGNOSIS — M170 Bilateral primary osteoarthritis of knee: Secondary | ICD-10-CM | POA: Diagnosis not present

## 2024-06-21 ENCOUNTER — Other Ambulatory Visit (HOSPITAL_BASED_OUTPATIENT_CLINIC_OR_DEPARTMENT_OTHER): Payer: Self-pay

## 2024-06-21 ENCOUNTER — Other Ambulatory Visit: Payer: Self-pay | Admitting: Family

## 2024-06-21 DIAGNOSIS — F909 Attention-deficit hyperactivity disorder, unspecified type: Secondary | ICD-10-CM

## 2024-06-21 MED ORDER — AMPHETAMINE-DEXTROAMPHET ER 30 MG PO CP24
30.0000 mg | ORAL_CAPSULE | ORAL | 0 refills | Status: DC
  Filled 2024-06-21: qty 30, 30d supply, fill #0

## 2024-06-21 NOTE — Telephone Encounter (Signed)
 Requesting: Adderall XR 30mg   Contract: 05/30/24 UDS: 05/30/24 Last Visit: 05/30/24 Next Visit: None Last Refill: 05/18/24 #30 and 0RF   Please Advise

## 2024-06-23 DIAGNOSIS — M170 Bilateral primary osteoarthritis of knee: Secondary | ICD-10-CM | POA: Diagnosis not present

## 2024-07-04 DIAGNOSIS — M62838 Other muscle spasm: Secondary | ICD-10-CM | POA: Diagnosis not present

## 2024-07-04 DIAGNOSIS — Z79890 Hormone replacement therapy (postmenopausal): Secondary | ICD-10-CM | POA: Diagnosis not present

## 2024-07-07 DIAGNOSIS — M170 Bilateral primary osteoarthritis of knee: Secondary | ICD-10-CM | POA: Diagnosis not present

## 2024-07-28 ENCOUNTER — Other Ambulatory Visit: Payer: Self-pay | Admitting: Family

## 2024-07-28 DIAGNOSIS — F909 Attention-deficit hyperactivity disorder, unspecified type: Secondary | ICD-10-CM

## 2024-07-28 NOTE — Telephone Encounter (Signed)
 Requesting: Adderall XR 30mg   Contract:05/30/24 UDS:05/30/24 Last Visit: 05/30/24 Next Visit: None Last Refill: 06/21/24 #30 and 0RF   Please Advise

## 2024-07-31 ENCOUNTER — Other Ambulatory Visit: Payer: Self-pay

## 2024-07-31 ENCOUNTER — Other Ambulatory Visit (HOSPITAL_BASED_OUTPATIENT_CLINIC_OR_DEPARTMENT_OTHER): Payer: Self-pay

## 2024-07-31 MED ORDER — AMPHETAMINE-DEXTROAMPHET ER 30 MG PO CP24
30.0000 mg | ORAL_CAPSULE | ORAL | 0 refills | Status: DC
  Filled 2024-07-31 (×2): qty 30, 30d supply, fill #0

## 2024-08-31 ENCOUNTER — Other Ambulatory Visit: Payer: Self-pay | Admitting: Family

## 2024-08-31 DIAGNOSIS — F909 Attention-deficit hyperactivity disorder, unspecified type: Secondary | ICD-10-CM

## 2024-09-01 ENCOUNTER — Other Ambulatory Visit (HOSPITAL_BASED_OUTPATIENT_CLINIC_OR_DEPARTMENT_OTHER): Payer: Self-pay

## 2024-09-01 MED ORDER — AMPHETAMINE-DEXTROAMPHET ER 30 MG PO CP24
30.0000 mg | ORAL_CAPSULE | ORAL | 0 refills | Status: DC
  Filled 2024-09-01: qty 30, 30d supply, fill #0

## 2024-09-04 ENCOUNTER — Other Ambulatory Visit: Payer: Self-pay | Admitting: Family

## 2024-09-04 DIAGNOSIS — N3281 Overactive bladder: Secondary | ICD-10-CM

## 2024-10-04 ENCOUNTER — Other Ambulatory Visit: Payer: Self-pay | Admitting: Family

## 2024-10-04 DIAGNOSIS — F909 Attention-deficit hyperactivity disorder, unspecified type: Secondary | ICD-10-CM

## 2024-10-05 ENCOUNTER — Other Ambulatory Visit (HOSPITAL_BASED_OUTPATIENT_CLINIC_OR_DEPARTMENT_OTHER): Payer: Self-pay

## 2024-10-05 MED ORDER — AMPHETAMINE-DEXTROAMPHET ER 30 MG PO CP24
30.0000 mg | ORAL_CAPSULE | ORAL | 0 refills | Status: DC
  Filled 2024-10-05: qty 30, 30d supply, fill #0

## 2024-10-05 NOTE — Telephone Encounter (Signed)
 Please contact pt to schedule a follow up visit.  Refill sent.

## 2024-11-04 ENCOUNTER — Other Ambulatory Visit: Payer: Self-pay | Admitting: Family

## 2024-11-04 DIAGNOSIS — F909 Attention-deficit hyperactivity disorder, unspecified type: Secondary | ICD-10-CM

## 2024-11-06 ENCOUNTER — Other Ambulatory Visit (HOSPITAL_BASED_OUTPATIENT_CLINIC_OR_DEPARTMENT_OTHER): Payer: Self-pay

## 2024-11-06 ENCOUNTER — Encounter (HOSPITAL_BASED_OUTPATIENT_CLINIC_OR_DEPARTMENT_OTHER): Payer: Self-pay | Admitting: Pharmacist

## 2024-11-06 MED ORDER — AMPHETAMINE-DEXTROAMPHET ER 30 MG PO CP24
30.0000 mg | ORAL_CAPSULE | ORAL | 0 refills | Status: DC
  Filled 2024-11-06: qty 11, 11d supply, fill #0

## 2024-11-06 NOTE — Telephone Encounter (Signed)
 I sent her refill for Adderall but she is due for follow up visit. Please call pt to schedule a follow up.

## 2024-11-07 NOTE — Telephone Encounter (Signed)
 Called but no answer, left detailed message for patient to call and set up appointment

## 2024-11-23 NOTE — Telephone Encounter (Signed)
 Appt scheduled 12/13/24

## 2024-11-28 ENCOUNTER — Other Ambulatory Visit: Payer: Self-pay | Admitting: Family

## 2024-11-29 ENCOUNTER — Encounter: Payer: Self-pay | Admitting: Family

## 2024-11-30 ENCOUNTER — Other Ambulatory Visit: Payer: Self-pay | Admitting: Family

## 2024-11-30 ENCOUNTER — Other Ambulatory Visit (HOSPITAL_BASED_OUTPATIENT_CLINIC_OR_DEPARTMENT_OTHER): Payer: Self-pay

## 2024-11-30 DIAGNOSIS — F909 Attention-deficit hyperactivity disorder, unspecified type: Secondary | ICD-10-CM

## 2024-11-30 MED ORDER — AMPHETAMINE-DEXTROAMPHET ER 30 MG PO CP24
30.0000 mg | ORAL_CAPSULE | ORAL | 0 refills | Status: AC
  Filled 2024-11-30: qty 30, 30d supply, fill #0

## 2024-11-30 MED ORDER — AMPHETAMINE-DEXTROAMPHET ER 30 MG PO CP24: 30.0000 mg | ORAL_CAPSULE | ORAL | 0 refills | Status: DC

## 2024-12-13 ENCOUNTER — Ambulatory Visit: Admitting: Family
